# Patient Record
Sex: Male | Born: 1950 | Race: Asian | Hispanic: No | Smoking: Former smoker
Health system: Southern US, Community
[De-identification: ages and names within clinical notes are randomized; demographics above are authoritative.]

## PROBLEM LIST (undated history)

## (undated) ENCOUNTER — Emergency Department: Admission: EM | Discharge status: Absent without leave | Payer: Self-pay

## (undated) DIAGNOSIS — N400 Enlarged prostate without lower urinary tract symptoms: Secondary | ICD-10-CM

## (undated) DIAGNOSIS — J9819 Other pulmonary collapse: Secondary | ICD-10-CM

## (undated) DIAGNOSIS — J439 Emphysema, unspecified: Secondary | ICD-10-CM

## (undated) DIAGNOSIS — H269 Unspecified cataract: Secondary | ICD-10-CM

## (undated) DIAGNOSIS — I10 Essential (primary) hypertension: Secondary | ICD-10-CM

## (undated) DIAGNOSIS — N210 Calculus in bladder: Secondary | ICD-10-CM

## (undated) HISTORY — PX: PROSTATE SURGERY: SHX751

## (undated) HISTORY — DX: Unspecified cataract: H26.9

## (undated) HISTORY — PX: OTHER SURGICAL HISTORY: SHX169

## (undated) HISTORY — PX: NO PAST SURGERIES: SHX2092

---

## 2014-12-25 DIAGNOSIS — R079 Chest pain, unspecified: Secondary | ICD-10-CM | POA: Diagnosis not present

## 2015-03-17 DIAGNOSIS — J441 Chronic obstructive pulmonary disease with (acute) exacerbation: Secondary | ICD-10-CM | POA: Diagnosis not present

## 2015-05-29 ENCOUNTER — Other Ambulatory Visit: Payer: Self-pay | Admitting: Internal Medicine

## 2015-05-29 ENCOUNTER — Ambulatory Visit
Admission: RE | Admit: 2015-05-29 | Discharge: 2015-05-29 | Disposition: A | Discharge status: Home / Self Care | Payer: Medicare Other | Source: Ambulatory Visit | Attending: Internal Medicine | Admitting: Internal Medicine

## 2015-05-29 DIAGNOSIS — R911 Solitary pulmonary nodule: Secondary | ICD-10-CM | POA: Diagnosis not present

## 2015-05-29 DIAGNOSIS — R05 Cough: Secondary | ICD-10-CM | POA: Insufficient documentation

## 2015-05-29 DIAGNOSIS — J90 Pleural effusion, not elsewhere classified: Secondary | ICD-10-CM

## 2015-05-29 DIAGNOSIS — R0602 Shortness of breath: Secondary | ICD-10-CM | POA: Diagnosis not present

## 2015-05-29 DIAGNOSIS — R059 Cough, unspecified: Secondary | ICD-10-CM

## 2015-06-04 ENCOUNTER — Ambulatory Visit
Admission: RE | Admit: 2015-06-04 | Discharge: 2015-06-04 | Disposition: A | Discharge status: Home / Self Care | Payer: Medicare Other | Source: Ambulatory Visit | Attending: Internal Medicine | Admitting: Internal Medicine

## 2015-06-04 DIAGNOSIS — J9811 Atelectasis: Secondary | ICD-10-CM | POA: Diagnosis not present

## 2015-06-04 DIAGNOSIS — N200 Calculus of kidney: Secondary | ICD-10-CM | POA: Diagnosis not present

## 2015-06-04 DIAGNOSIS — I714 Abdominal aortic aneurysm, without rupture: Secondary | ICD-10-CM | POA: Insufficient documentation

## 2015-06-04 DIAGNOSIS — R918 Other nonspecific abnormal finding of lung field: Secondary | ICD-10-CM | POA: Diagnosis not present

## 2015-06-04 DIAGNOSIS — J439 Emphysema, unspecified: Secondary | ICD-10-CM | POA: Insufficient documentation

## 2015-06-04 DIAGNOSIS — J90 Pleural effusion, not elsewhere classified: Secondary | ICD-10-CM | POA: Diagnosis not present

## 2015-06-04 DIAGNOSIS — I700 Atherosclerosis of aorta: Secondary | ICD-10-CM | POA: Insufficient documentation

## 2015-06-04 DIAGNOSIS — J984 Other disorders of lung: Secondary | ICD-10-CM | POA: Diagnosis not present

## 2015-06-04 HISTORY — DX: Essential (primary) hypertension: I10

## 2015-06-04 MED ORDER — IOHEXOL 300 MG/ML  SOLN
75.0000 mL | Freq: Once | INTRAMUSCULAR | Status: AC | PRN
  Administered 2015-06-04: 75 mL via INTRAVENOUS

## 2015-06-06 ENCOUNTER — Other Ambulatory Visit
Admission: RE | Admit: 2015-06-06 | Discharge: 2015-06-06 | Disposition: A | Discharge status: Home / Self Care | Payer: Medicare Other | Source: Ambulatory Visit | Attending: Physician Assistant | Admitting: Physician Assistant

## 2015-06-06 DIAGNOSIS — R918 Other nonspecific abnormal finding of lung field: Secondary | ICD-10-CM | POA: Insufficient documentation

## 2015-06-06 DIAGNOSIS — R0602 Shortness of breath: Secondary | ICD-10-CM | POA: Diagnosis not present

## 2015-06-06 DIAGNOSIS — J439 Emphysema, unspecified: Secondary | ICD-10-CM | POA: Insufficient documentation

## 2015-06-09 ENCOUNTER — Other Ambulatory Visit
Admission: RE | Admit: 2015-06-09 | Discharge: 2015-06-09 | Disposition: A | Discharge status: Home / Self Care | Payer: Medicare Other | Source: Other Acute Inpatient Hospital | Attending: Physician Assistant | Admitting: Physician Assistant

## 2015-06-09 DIAGNOSIS — J90 Pleural effusion, not elsewhere classified: Secondary | ICD-10-CM | POA: Insufficient documentation

## 2015-06-09 DIAGNOSIS — R918 Other nonspecific abnormal finding of lung field: Secondary | ICD-10-CM | POA: Insufficient documentation

## 2015-06-09 DIAGNOSIS — J439 Emphysema, unspecified: Secondary | ICD-10-CM | POA: Diagnosis not present

## 2015-06-09 LAB — EXPECTORATED SPUTUM ASSESSMENT W REFEX TO RESP CULTURE

## 2015-06-09 LAB — EXPECTORATED SPUTUM ASSESSMENT W GRAM STAIN, RFLX TO RESP C

## 2015-06-12 DIAGNOSIS — F17211 Nicotine dependence, cigarettes, in remission: Secondary | ICD-10-CM | POA: Diagnosis not present

## 2015-06-12 DIAGNOSIS — J90 Pleural effusion, not elsewhere classified: Secondary | ICD-10-CM | POA: Diagnosis not present

## 2015-06-13 ENCOUNTER — Other Ambulatory Visit: Payer: Self-pay | Admitting: Internal Medicine

## 2015-06-13 DIAGNOSIS — J90 Pleural effusion, not elsewhere classified: Secondary | ICD-10-CM

## 2015-06-16 ENCOUNTER — Other Ambulatory Visit: Payer: Self-pay | Admitting: Radiology

## 2015-06-17 ENCOUNTER — Ambulatory Visit
Admission: RE | Admit: 2015-06-17 | Discharge: 2015-06-17 | Disposition: A | Discharge status: Home / Self Care | Payer: Medicare Other | Source: Ambulatory Visit | Attending: Internal Medicine | Admitting: Internal Medicine

## 2015-06-17 ENCOUNTER — Other Ambulatory Visit: Payer: Self-pay | Admitting: Internal Medicine

## 2015-06-17 DIAGNOSIS — J90 Pleural effusion, not elsewhere classified: Secondary | ICD-10-CM

## 2015-06-17 NOTE — Progress Notes (Signed)
Patient understands English fairly well but [place] Interpreter present.

## 2015-06-18 DIAGNOSIS — R0602 Shortness of breath: Secondary | ICD-10-CM | POA: Diagnosis not present

## 2015-07-22 LAB — ACID FAST SMEAR+CULTURE W/RFLX (ARMC ONLY)
Acid Fast Culture: NEGATIVE
Acid Fast Smear: NEGATIVE

## 2015-07-23 LAB — ACID FAST SMEAR+CULTURE W/RFLX (ARMC ONLY)
ACID FAST SMEAR: NEGATIVE
Acid Fast Culture: NEGATIVE

## 2015-07-28 LAB — ACID FAST SMEAR+CULTURE W/RFLX (ARMC ONLY)
Acid Fast Culture: NEGATIVE
Acid Fast Smear: NEGATIVE

## 2015-08-12 ENCOUNTER — Other Ambulatory Visit: Payer: Self-pay | Admitting: Internal Medicine

## 2015-08-12 DIAGNOSIS — J90 Pleural effusion, not elsewhere classified: Secondary | ICD-10-CM

## 2015-08-12 DIAGNOSIS — F17211 Nicotine dependence, cigarettes, in remission: Secondary | ICD-10-CM | POA: Diagnosis not present

## 2015-08-12 DIAGNOSIS — J439 Emphysema, unspecified: Secondary | ICD-10-CM | POA: Diagnosis not present

## 2015-08-14 ENCOUNTER — Ambulatory Visit
Admission: RE | Admit: 2015-08-14 | Discharge: 2015-08-14 | Disposition: A | Discharge status: Home / Self Care | Payer: Medicare Other | Source: Ambulatory Visit | Attending: Internal Medicine | Admitting: Internal Medicine

## 2015-08-14 DIAGNOSIS — J439 Emphysema, unspecified: Secondary | ICD-10-CM | POA: Insufficient documentation

## 2015-08-14 DIAGNOSIS — R918 Other nonspecific abnormal finding of lung field: Secondary | ICD-10-CM | POA: Insufficient documentation

## 2015-08-14 DIAGNOSIS — J90 Pleural effusion, not elsewhere classified: Secondary | ICD-10-CM | POA: Insufficient documentation

## 2015-08-14 DIAGNOSIS — I714 Abdominal aortic aneurysm, without rupture: Secondary | ICD-10-CM | POA: Diagnosis not present

## 2015-08-14 DIAGNOSIS — Z79899 Other long term (current) drug therapy: Secondary | ICD-10-CM | POA: Diagnosis not present

## 2015-08-14 LAB — GLUCOSE, CAPILLARY: Glucose-Capillary: 94 mg/dL (ref 65–99)

## 2015-08-14 MED ORDER — FLUDEOXYGLUCOSE F - 18 (FDG) INJECTION
12.0000 | Freq: Once | INTRAVENOUS | Status: DC | PRN
  Administered 2015-08-14: 12.9 via INTRAVENOUS
  Filled 2015-08-14: qty 12

## 2015-08-19 DIAGNOSIS — R918 Other nonspecific abnormal finding of lung field: Secondary | ICD-10-CM | POA: Diagnosis not present

## 2015-08-19 DIAGNOSIS — J439 Emphysema, unspecified: Secondary | ICD-10-CM | POA: Diagnosis not present

## 2015-08-19 DIAGNOSIS — J90 Pleural effusion, not elsewhere classified: Secondary | ICD-10-CM | POA: Diagnosis not present

## 2015-09-17 ENCOUNTER — Other Ambulatory Visit: Payer: Self-pay | Admitting: Physician Assistant

## 2015-09-17 DIAGNOSIS — J439 Emphysema, unspecified: Secondary | ICD-10-CM

## 2015-09-17 DIAGNOSIS — R918 Other nonspecific abnormal finding of lung field: Secondary | ICD-10-CM

## 2015-09-30 ENCOUNTER — Ambulatory Visit
Admission: RE | Admit: 2015-09-30 | Discharge: 2015-09-30 | Disposition: A | Discharge status: Home / Self Care | Payer: Medicare Other | Source: Ambulatory Visit | Attending: Physician Assistant | Admitting: Physician Assistant

## 2015-09-30 DIAGNOSIS — R918 Other nonspecific abnormal finding of lung field: Secondary | ICD-10-CM | POA: Diagnosis present

## 2015-09-30 DIAGNOSIS — J439 Emphysema, unspecified: Secondary | ICD-10-CM

## 2015-09-30 DIAGNOSIS — R05 Cough: Secondary | ICD-10-CM | POA: Diagnosis not present

## 2015-09-30 DIAGNOSIS — J189 Pneumonia, unspecified organism: Secondary | ICD-10-CM | POA: Diagnosis not present

## 2015-09-30 MED ORDER — IOHEXOL 350 MG/ML SOLN
75.0000 mL | Freq: Once | INTRAVENOUS | Status: AC | PRN
  Administered 2015-09-30: 75 mL via INTRAVENOUS

## 2015-10-07 DIAGNOSIS — J439 Emphysema, unspecified: Secondary | ICD-10-CM | POA: Diagnosis not present

## 2015-10-07 DIAGNOSIS — R0602 Shortness of breath: Secondary | ICD-10-CM | POA: Diagnosis not present

## 2015-10-07 DIAGNOSIS — R918 Other nonspecific abnormal finding of lung field: Secondary | ICD-10-CM | POA: Diagnosis not present

## 2015-11-11 DIAGNOSIS — J439 Emphysema, unspecified: Secondary | ICD-10-CM | POA: Diagnosis not present

## 2015-11-11 DIAGNOSIS — J189 Pneumonia, unspecified organism: Secondary | ICD-10-CM | POA: Diagnosis not present

## 2015-12-30 ENCOUNTER — Other Ambulatory Visit: Payer: Self-pay | Admitting: Internal Medicine

## 2015-12-30 DIAGNOSIS — J439 Emphysema, unspecified: Secondary | ICD-10-CM | POA: Diagnosis not present

## 2015-12-30 DIAGNOSIS — R0602 Shortness of breath: Secondary | ICD-10-CM | POA: Diagnosis not present

## 2015-12-30 DIAGNOSIS — F17211 Nicotine dependence, cigarettes, in remission: Secondary | ICD-10-CM | POA: Diagnosis not present

## 2015-12-30 DIAGNOSIS — J189 Pneumonia, unspecified organism: Secondary | ICD-10-CM

## 2016-01-22 ENCOUNTER — Ambulatory Visit
Admission: RE | Admit: 2016-01-22 | Discharge: 2016-01-22 | Disposition: A | Discharge status: Home / Self Care | Payer: Medicare Other | Source: Ambulatory Visit | Attending: Internal Medicine | Admitting: Internal Medicine

## 2016-01-22 DIAGNOSIS — J439 Emphysema, unspecified: Secondary | ICD-10-CM | POA: Insufficient documentation

## 2016-01-22 DIAGNOSIS — J189 Pneumonia, unspecified organism: Secondary | ICD-10-CM | POA: Insufficient documentation

## 2016-01-22 DIAGNOSIS — I2510 Atherosclerotic heart disease of native coronary artery without angina pectoris: Secondary | ICD-10-CM | POA: Insufficient documentation

## 2016-01-22 DIAGNOSIS — I714 Abdominal aortic aneurysm, without rupture: Secondary | ICD-10-CM | POA: Insufficient documentation

## 2016-01-22 LAB — POCT I-STAT CREATININE: Creatinine, Ser: 0.8 mg/dL (ref 0.61–1.24)

## 2016-01-22 MED ORDER — IOHEXOL 300 MG/ML  SOLN
75.0000 mL | Freq: Once | INTRAMUSCULAR | Status: AC | PRN
  Administered 2016-01-22: 75 mL via INTRAVENOUS

## 2016-02-10 DIAGNOSIS — J439 Emphysema, unspecified: Secondary | ICD-10-CM | POA: Diagnosis not present

## 2016-02-10 DIAGNOSIS — R918 Other nonspecific abnormal finding of lung field: Secondary | ICD-10-CM | POA: Diagnosis not present

## 2016-02-10 DIAGNOSIS — I714 Abdominal aortic aneurysm, without rupture: Secondary | ICD-10-CM | POA: Diagnosis not present

## 2016-03-01 DIAGNOSIS — I714 Abdominal aortic aneurysm, without rupture: Secondary | ICD-10-CM | POA: Diagnosis not present

## 2017-10-31 DIAGNOSIS — R300 Dysuria: Secondary | ICD-10-CM | POA: Diagnosis not present

## 2017-10-31 DIAGNOSIS — N3001 Acute cystitis with hematuria: Secondary | ICD-10-CM | POA: Diagnosis not present

## 2017-11-29 HISTORY — PX: PROSTATE SURGERY: SHX751

## 2017-12-19 ENCOUNTER — Emergency Department: Payer: Medicare Other

## 2017-12-19 ENCOUNTER — Encounter: Payer: Self-pay | Admitting: *Deleted

## 2017-12-19 ENCOUNTER — Emergency Department
Admission: EM | Admit: 2017-12-19 | Discharge: 2017-12-19 | Disposition: A | Discharge status: Home / Self Care | Payer: Medicare Other | Attending: Emergency Medicine | Admitting: Emergency Medicine

## 2017-12-19 ENCOUNTER — Other Ambulatory Visit: Payer: Self-pay

## 2017-12-19 DIAGNOSIS — I714 Abdominal aortic aneurysm, without rupture, unspecified: Secondary | ICD-10-CM

## 2017-12-19 DIAGNOSIS — F17200 Nicotine dependence, unspecified, uncomplicated: Secondary | ICD-10-CM | POA: Diagnosis not present

## 2017-12-19 DIAGNOSIS — N200 Calculus of kidney: Secondary | ICD-10-CM | POA: Insufficient documentation

## 2017-12-19 DIAGNOSIS — I10 Essential (primary) hypertension: Secondary | ICD-10-CM | POA: Insufficient documentation

## 2017-12-19 DIAGNOSIS — R319 Hematuria, unspecified: Secondary | ICD-10-CM | POA: Diagnosis present

## 2017-12-19 LAB — COMPREHENSIVE METABOLIC PANEL
ALBUMIN: 4.2 g/dL (ref 3.5–5.0)
ALT: 15 U/L — ABNORMAL LOW (ref 17–63)
ANION GAP: 9 (ref 5–15)
AST: 25 U/L (ref 15–41)
Alkaline Phosphatase: 60 U/L (ref 38–126)
BUN: 17 mg/dL (ref 6–20)
CHLORIDE: 105 mmol/L (ref 101–111)
CO2: 27 mmol/L (ref 22–32)
Calcium: 9.2 mg/dL (ref 8.9–10.3)
Creatinine, Ser: 0.9 mg/dL (ref 0.61–1.24)
GFR calc non Af Amer: >60 mL/min (ref >60)
GLUCOSE: 142 mg/dL — AB (ref 65–99)
POTASSIUM: 4.3 mmol/L (ref 3.5–5.1)
SODIUM: 141 mmol/L (ref 135–145)
Total Bilirubin: 0.5 mg/dL (ref 0.3–1.2)
Total Protein: 7.5 g/dL (ref 6.5–8.1)

## 2017-12-19 LAB — URINALYSIS, COMPLETE (UACMP) WITH MICROSCOPIC
Bacteria, UA: NONE SEEN
Bilirubin Urine: NEGATIVE
GLUCOSE, UA: NEGATIVE mg/dL
Ketones, ur: NEGATIVE mg/dL
Nitrite: NEGATIVE
PROTEIN: 100 mg/dL — AB
RBC / HPF: NONE SEEN RBC/hpf (ref 0–5)
SQUAMOUS EPITHELIAL / LPF: NONE SEEN
Specific Gravity, Urine: 1.02 (ref 1.005–1.030)
WBC, UA: NONE SEEN WBC/hpf (ref 0–5)
pH: 6 (ref 5.0–8.0)

## 2017-12-19 LAB — CBC
HEMATOCRIT: 39.7 % — AB (ref 40.0–52.0)
Hemoglobin: 12.9 g/dL — ABNORMAL LOW (ref 13.0–18.0)
MCH: 28.3 pg (ref 26.0–34.0)
MCHC: 32.3 g/dL (ref 32.0–36.0)
MCV: 87.5 fL (ref 80.0–100.0)
Platelets: 329 10*3/uL (ref 150–440)
RBC: 4.54 MIL/uL (ref 4.40–5.90)
RDW: 13.7 % (ref 11.5–14.5)
WBC: 6.7 10*3/uL (ref 3.8–10.6)

## 2017-12-19 NOTE — Progress Notes (Signed)
Pt wanted to walk; refusing wheelchair to ct.

## 2017-12-19 NOTE — ED Notes (Signed)
Patient transported to CT 

## 2017-12-19 NOTE — ED Notes (Signed)
Pt ambulatory upon discharge. Verbalized understanding of discharge instructions and follow-up care. VSS. Skin warm and dry. A&O x4.  

## 2017-12-19 NOTE — ED Provider Notes (Signed)
[facility] Emergency Department Provider Note  ____________________________________________  Time seen: Approximately 8:40 PM  I have reviewed the triage vital signs and the nursing notes.   HISTORY  Chief Complaint Hematuria   HPI Jesse Douglas is a 67 y.o. male who presents for evaluation of hematuria. Patient reports that he noticed hematuria month ago. He went to urgent care and was given a medication that he does not remember the name. He took it for 2 weeks with resolution of his symptoms. 2 weeks ago when the medication was over his started having again hematuria that he describes as blood-tinged urine. He denies abdominal pain or flank pain. He denies any prior history of kidney stones. He denies any personal or family history of cancer. He denies any intentional weight loss or night sweats. He does endorse for the last 2 days burning sensation when he urinates. No fever or chills, no nausea or vomiting.  Past Medical History:  Diagnosis Date  . Hypertension     There are no active problems to display for this patient.   No past surgical history on file.  Prior to Admission medications   Medication Sig Start Date End Date Taking? Authorizing Provider  Multiple Vitamin (MULTIVITAMIN) capsule Take 1 capsule by mouth daily.    [provider]    Allergies Penicillins  No family history on file.  Social History Social History   Tobacco Use  . Smoking status: Current Every Day Smoker    Last attempt to quit: 04/17/2015    Years since quitting: 2.6  . Smokeless tobacco: Never Used  Substance Use Topics  . Alcohol use: No  . Drug use: No    Review of Systems  Constitutional: Negative for fever. Eyes: Negative for visual changes. ENT: Negative for sore throat. Neck: No neck pain  Cardiovascular: Negative for chest pain. Respiratory: Negative for shortness of breath. Gastrointestinal: Negative for abdominal pain, vomiting or  diarrhea. Genitourinary: Negative for dysuria. + hematuria Musculoskeletal: Negative for back pain. Skin: Negative for rash. Neurological: Negative for headaches, weakness or numbness. Psych: No SI or HI  ____________________________________________   PHYSICAL EXAM:  VITAL SIGNS: ED Triage Vitals  Enc Vitals Group     BP 12/19/17 1703 (!) 155/88     Pulse Rate 12/19/17 1703 (!) 116     Resp 12/19/17 1703 18     Temp 12/19/17 1703 98.7 F (37.1 C)     Temp Source 12/19/17 1703 Oral     SpO2 12/19/17 1703 98 %     Weight 12/19/17 1705 85 lb (38.6 kg)     Height 12/19/17 1705 5\' 4"  (1.626 m)     Head Circumference --      Peak Flow --      Pain Score 12/19/17 1703 8     Pain Loc --      Pain Edu? --      Excl. in GC? --     Constitutional: Alert and oriented. Well appearing and in no apparent distress. HEENT:      Head: Normocephalic and atraumatic.         Eyes: Conjunctivae are normal. Sclera is non-icteric.       Mouth/Throat: Mucous membranes are moist.       Neck: Supple with no signs of meningismus. Cardiovascular: Regular rate and rhythm. No murmurs, gallops, or rubs. 2+ symmetrical distal pulses are present in all extremities. No JVD. Respiratory: Normal respiratory effort. Lungs are clear to auscultation bilaterally.  No wheezes, crackles, or rhonchi.  Gastrointestinal: Soft, non tender, and non distended with positive bowel sounds. No rebound or guarding. Genitourinary: No CVA tenderness. Musculoskeletal: Nontender with normal range of motion in all extremities. No edema, cyanosis, or erythema of extremities. Neurologic: Normal speech and language. Face is symmetric. Moving all extremities. No gross focal neurologic deficits are appreciated. Skin: Skin is warm, dry and intact. No rash noted. Psychiatric: Mood and affect are normal. Speech and behavior are normal.  ____________________________________________   LABS (all labs ordered are listed, but only  abnormal results are displayed)  Labs Reviewed  COMPREHENSIVE METABOLIC PANEL - Abnormal; Notable for the following components:      Result Value   Glucose, Bld 142 (*)    ALT 15 (*)    All other components within normal limits  CBC - Abnormal; Notable for the following components:   Hemoglobin 12.9 (*)    HCT 39.7 (*)    All other components within normal limits  URINALYSIS, COMPLETE (UACMP) WITH MICROSCOPIC - Abnormal; Notable for the following components:   Color, Urine AMBER (*)    APPearance CLOUDY (*)    Hgb urine dipstick LARGE (*)    Protein, ur 100 (*)    Leukocytes, UA TRACE (*)    All other components within normal limits  URINE CULTURE   ____________________________________________  EKG  none  ____________________________________________  RADIOLOGY  CT renal:  1. 14 x 11 mm calculus within the bladder lumen. Surrounding bladder wall thickening may be related incomplete distension, irritation/inflammation, or possibly acute infection. Correlation with urinalysis recommended. 2. Additional nonobstructive right renal nephrolithiasis measuring up to 7 mm. No obstructive uropathy. 3. Severe emphysema. Intra-abdominal aortic aneurysm measuring up to 3.4 cm in diameter. Recommend followup by ultrasound in 3 years. This recommendation follows ACR consensus guidelines: White Paper of the ACR Incidental Findings Committee II on Vascular Findings. J Am Coll Radiol 2013; 16:109-604 ____________________________________________   PROCEDURES  Procedure(s) performed: None Procedures Critical Care performed:  None ____________________________________________   INITIAL IMPRESSION / ASSESSMENT AND PLAN / ED COURSE  67 y.o. male who presents for evaluation of hematuria x 2 weeks and dysuria. Patient is well-appearing, in no distress, abdomen is soft with no tenderness or masses, no flank tenderness. Urinalysis shows large hemoglobin but no evidence of infection. White  count and creatinine are within normal limits. CT scan concerning for a 14 x 11 mm calculus within the bladder lumen. CT also concerning for an accidental finding of the intra-abdominal aortic aneurysm measuring 3.4 cm in diameter. Hematuria from a recently passed kidney stone. No evidence of AKI or overlying UTI. No evidence of acute blood loss anemia.Will refer to Urology and vascular for outpatient follow up.       As part of my medical decision making, I reviewed the following data within the electronic MEDICAL RECORD NUMBER Nursing notes reviewed and incorporated, Labs reviewed , Radiograph reviewed , Notes from prior ED visits and [place] Controlled Substance Database    Pertinent labs & imaging results that were available during my care of the patient were reviewed by me and considered in my medical decision making (see chart for details).    ____________________________________________   FINAL CLINICAL IMPRESSION(S) / ED DIAGNOSES  Final diagnoses:  Kidney stone  Abdominal aortic aneurysm (AAA) without rupture (HCC)      NEW MEDICATIONS STARTED DURING THIS VISIT:  ED Discharge Orders    None       Note:  This document was prepared  using Conservation officer, historic buildingsDragon voice recognition software and may include unintentional dictation errors.    Nita SickleVeronese, [place], MD 12/19/17 [id number]

## 2017-12-19 NOTE — Discharge Instructions (Signed)
Your CT scan showed you had a kidney stone that passed and is now in your bladder. This can cause blood in the urine and pain when you pee. This is not a serious problem and the pain will resolve in a few days. Follow up with Dr. Apolinar JunesBrandon, Urology.  Your CT also showed you have a bulge in your aorta. This needs to be followed very closely by a vascular doctor and I am referring you to Dr. Gilda CreaseSchnier. If you develop abdominal pain, chest pain, or back pain you need to return to the ER urgent as these bulges can get worse or even rupture and therefore can cause severe problems including death. Make sure to schedule an appointment with Dr. Gilda CreaseSchnier within the next week.

## 2017-12-19 NOTE — ED Notes (Signed)
Pt reports pain while urinating x30 days. Reports blood in urine. Was on medicine for urinary tract infection but did not help. Cannot remember name of medication.

## 2017-12-19 NOTE — ED Triage Notes (Addendum)
Pt reports hematuria since yesterday.  No n/v/d.  Pr has urinary urgency.  Pt denies back pain.  Pt alert.  Pt seen at next care and sent to er for eval of blood in urine.

## 2017-12-21 LAB — URINE CULTURE: Culture: NO GROWTH

## 2017-12-30 ENCOUNTER — Ambulatory Visit (INDEPENDENT_AMBULATORY_CARE_PROVIDER_SITE_OTHER): Payer: Medicare Other | Admitting: Urology

## 2017-12-30 ENCOUNTER — Encounter: Payer: Self-pay | Admitting: Urology

## 2017-12-30 VITALS — BP 159/97 | HR 99 | Ht 63.0 in | Wt 81.0 lb

## 2017-12-30 DIAGNOSIS — N401 Enlarged prostate with lower urinary tract symptoms: Secondary | ICD-10-CM | POA: Diagnosis not present

## 2017-12-30 DIAGNOSIS — N210 Calculus in bladder: Secondary | ICD-10-CM | POA: Diagnosis not present

## 2017-12-30 DIAGNOSIS — R310 Gross hematuria: Secondary | ICD-10-CM | POA: Diagnosis not present

## 2017-12-30 DIAGNOSIS — N200 Calculus of kidney: Secondary | ICD-10-CM | POA: Diagnosis not present

## 2017-12-30 DIAGNOSIS — R3915 Urgency of urination: Secondary | ICD-10-CM | POA: Diagnosis not present

## 2017-12-30 HISTORY — DX: Calculus in bladder: N21.0

## 2017-12-30 LAB — URINALYSIS, COMPLETE
Bilirubin, UA: NEGATIVE
Glucose, UA: NEGATIVE
Ketones, UA: NEGATIVE
Nitrite, UA: NEGATIVE
PH UA: 7.5 (ref 5.0–7.5)
PROTEIN UA: NEGATIVE
Specific Gravity, UA: 1.015 (ref 1.005–1.030)
Urobilinogen, Ur: 0.2 mg/dL (ref 0.2–1.0)

## 2017-12-30 LAB — BLADDER SCAN AMB NON-IMAGING

## 2017-12-30 LAB — MICROSCOPIC EXAMINATION
BACTERIA UA: NONE SEEN
EPITHELIAL CELLS (NON RENAL): NONE SEEN /HPF (ref 0–10)
WBC, UA: NONE SEEN /hpf (ref 0–?)

## 2017-12-30 MED ORDER — TAMSULOSIN HCL 0.4 MG PO CAPS: 0.4000 mg | ORAL_CAPSULE | Freq: Every day | ORAL | 11 refills | Status: DC

## 2017-12-30 NOTE — Progress Notes (Signed)
12/30/2017 2:55 PM   Jesse Douglas ZOX June 15, 1951 [id number]  Referring provider: Yevonne Pax, MD [address] [place], [place] [address]  Chief Complaint  Patient presents with  . Bladder Stone    HPI: 67 year old [place] speaking male who presents today for further evaluation of severe urinary symptoms, bladder stone, kidney stone, and gross hematuria.  He was initially seen and evaluated in the emergency room on 12/19/2017 with gross hematuria.  He reports that prior to this, he had several week history of increased urinary symptoms including urgency, frequency, nocturia, difficulty with his urinary stream and emptying his bladder.  He then developed episodes of painless gross hematuria.    Prior to this episode, he reports very few urinary symptoms.  He was getting up 1-2 times at night to void but denies any urgency, frequency, weak stream, or any other urinary issues.  For the last month, he has had severe urinary symptoms as described above which persists today.  Noncontrast CT scan in the emergency room revealed a large 14 x 11 mm bladder stone with circumferential bladder wall thickening as well as a 7 mm right upper pole stone and punctate right lower pole stone.   UA in the emergency room showed blood and leukocytes on dip but not under microscopy.  Urine culture was negative.  He does have evidence of microscopic hematuria today.  He denies a personal history of bladder or kidney stones.  He denies any flank pain bilaterally.  No previous history of gross hematuria.  He does have an extensive smoking history, has smoked since age 39 and continues today.  He smoked his watches 1 pack a day but has now cut back to 5 cigarettes daily.  He does have significant history of COPD and has been screened for lung cancer in the past.  Although he denies any weight loss, he is cachectic appearing with a BMI of 14.  No family history of prostate cancer.  No previous PSAs for  evaluation.   IPSS    Row Name 12/30/17 1200         International Prostate Symptom Score   How often have you had the sensation of not emptying your bladder?  About half the time     How often have you had to urinate less than every two hours?  More than half the time     How often have you found you stopped and started again several times when you urinated?  About half the time     How often have you found it difficult to postpone urination?  Less than half the time     How often have you had a weak urinary stream?  Almost always     How often have you had to strain to start urination?  Not at All     How many times did you typically get up at night to urinate?  2 Times     Total IPSS Score  19       Quality of Life due to urinary symptoms   If you were to spend the rest of your life with your urinary condition just the way it is now how would you feel about that?  Unhappy        Score:  1-7 Mild 8-19 Moderate 20-35 Severe    PMH: Past Medical History:  Diagnosis Date  . Hypertension     Surgical History: Past Surgical History:  Procedure Laterality Date  . none  Home Medications:  Allergies as of 12/30/2017      Reactions   Penicillins       Medication List        Accurate as of 12/30/17 11:59 PM. Always use your most recent med list.          tamsulosin 0.4 MG Caps capsule Commonly known as:  FLOMAX Take 1 capsule (0.4 mg total) by mouth daily.       Allergies:  Allergies  Allergen Reactions  . Penicillins     Family History: No family history on file.  Social History:  reports that he has been smoking.  he has never used smokeless tobacco. He reports that he does not drink alcohol or use drugs.  ROS: UROLOGY Frequent Urination?: Yes Hard to postpone urination?: Yes Burning/pain with urination?: Yes Get up at night to urinate?: Yes Leakage of urine?: Yes Urine stream starts and stops?: Yes Trouble starting stream?: Yes Do you have  to strain to urinate?: Yes Blood in urine?: Yes Urinary tract infection?: No Sexually transmitted disease?: No Injury to kidneys or bladder?: No Painful intercourse?: No Weak stream?: Yes Erection problems?: No Penile pain?: Yes  Gastrointestinal Nausea?: No Vomiting?: No Indigestion/heartburn?: No Diarrhea?: No Constipation?: No  Constitutional Fever: No Night sweats?: No Weight loss?: Yes Fatigue?: No  Skin Skin rash/lesions?: No Itching?: No  Eyes Blurred vision?: No Double vision?: No  Ears/Nose/Throat Sore throat?: No Sinus problems?: No  Hematologic/Lymphatic Swollen glands?: No Easy bruising?: No  Cardiovascular Leg swelling?: Yes Chest pain?: No  Respiratory Cough?: Yes Shortness of breath?: No  Endocrine Excessive thirst?: No  Musculoskeletal Back pain?: Yes Joint pain?: No  Neurological Headaches?: No Dizziness?: No  Psychologic Depression?: No  Physical Exam: BP (!) 159/97   Pulse 99   Ht 5\' 3"  (1.6 m)   Wt 81 lb (36.7 kg)   BMI 14.35 kg/m   Constitutional:  Alert and oriented, No acute distress.  Thin.  Accompanied by a [place] translator. HEENT: [place] AT, moist mucus membranes.  Trachea midline, no masses. Cardiovascular: No clubbing, cyanosis, or edema. Respiratory: Normal respiratory effort, no increased work of breathing. GI: Abdomen is soft, nontender, nondistended, no abdominal masses GU: Circumcised phallus with orthotopic meatus.  Bilateral descended testicles without masses. Rectal: Normal sphincter tone.  Enlarged 50+ cc prostate, bilateral nodularity which was rubbery most consistent with BPH nodules.  No firm nodules or induration. Skin: No rashes, bruises or suspicious lesions. Neurologic: Grossly intact, no focal deficits, moving all 4 extremities. Psychiatric: Normal mood and affect.  Laboratory Data: Lab Results  Component Value Date   WBC 6.7 12/19/2017   HGB 12.9 (L) 12/19/2017   HCT 39.7 (L) 12/19/2017    MCV 87.5 12/19/2017   PLT 329 12/19/2017    Lab Results  Component Value Date   CREATININE 0.90 12/19/2017    Lab Results  Component Value Date   PSA1 4.4 (H) 12/30/2017    Urinalysis Lab Results  Component Value Date   SPECGRAV 1.015 12/30/2017   PHUR 7.5 12/30/2017   COLORU Yellow 12/30/2017   APPEARANCEUR Clear 12/30/2017   LEUKOCYTESUR Trace (A) 12/30/2017   PROTEINUR Negative 12/30/2017   GLUCOSEU Negative 12/30/2017   KETONESU Negative 12/30/2017   RBCU 2+ (A) 12/30/2017   BILIRUBINUR Negative 12/30/2017   UUROB 0.2 12/30/2017   NITRITE Negative 12/30/2017    Lab Results  Component Value Date   LABMICR See below: 12/30/2017   WBCUA None seen 12/30/2017   RBCUA 11-30 (A)  12/30/2017   LABEPIT None seen 12/30/2017   BACTERIA None seen 12/30/2017    Pertinent Imaging: Results for orders placed during the hospital encounter of 12/19/17  CT Renal Stone Study   Narrative CLINICAL DATA:  Initial evaluation for acute dysuria, hematuria.  EXAM: CT ABDOMEN AND PELVIS WITHOUT CONTRAST  TECHNIQUE: Multidetector CT imaging of the abdomen and pelvis was performed following the standard protocol without IV contrast.  COMPARISON:  Prior CT from 08/14/2015.  FINDINGS: Lower chest: Severe emphysematous changes seen at the lung bases. Visualized lungs are otherwise clear.  Hepatobiliary: Limited noncontrast evaluation of the liver is unremarkable. Gallbladder within normal limits. No biliary dilatation.  Pancreas: Limited noncontrast evaluation of the pancreas grossly unremarkable.  Spleen: Spleen within normal limits.  Adrenals/Urinary Tract: Adrenal glands grossly unremarkable. Kidneys equal in size. 7 mm nonobstructive stone present at the upper pole the right kidney. Additional punctate 3 mm nonobstructive calculus present at the lower pole right kidney. No other radiopaque calculi identified within either kidney. No stones seen along the course  of either renal collecting system. No hydroureter.  Bladder largely decompressed. Large calculus measuring 14 x 11 mm present within the right bladder lumen (series 2, image 57). Circumferential bladder wall thickening likely related incomplete distension, although superimposed infection could be considered as well.  Stomach/Bowel: Stomach within normal limits. No evidence for bowel obstruction. No acute inflammatory changes seen about the bowels. Moderate retained stool diffusely throughout the colon.  Vascular/Lymphatic: Moderate aortic atherosclerosis. Intra-abdominal aneurysm measures up to 3.4 cm in maximal diameter. No appreciable adenopathy identified on this noncontrast examination.  Reproductive: Prostate mildly enlarged measuring 4.9 cm in transverse diameter.  Other: No free air or fluid.  Musculoskeletal: No acute osseus abnormality. No worrisome lytic or blastic osseous lesions.  IMPRESSION: 1. 14 x 11 mm calculus within the bladder lumen. Surrounding bladder wall thickening may be related incomplete distension, irritation/inflammation, or possibly acute infection. Correlation with urinalysis recommended. 2. Additional nonobstructive right renal nephrolithiasis measuring up to 7 mm. No obstructive uropathy. 3. Severe emphysema. Intra-abdominal aortic aneurysm measuring up to 3.4 cm in diameter. Recommend followup by ultrasound in 3 years. This recommendation follows ACR consensus guidelines: White Paper of the ACR Incidental Findings Committee II on Vascular Findings. [place] Coll Radiol 2013; 10:789-794   Electronically Signed   By: Rise Mu M.D.   On: 12/19/2017 20:23    PVR 0  Assessment & Plan:    1. Bladder stone 14 x 11 mm bladder stone likely contributing to irritative voiding symptoms Suspicious for underlying outlet obstruction as cause of bladder stone Previously asymptomatic with minimal PVR Recommend cystoscopy to rule out any  underlying pathology as well as to evaluate prostatic anatomy for possible concomitant outlet procedure - BLADDER SCAN AMB NON-IMAGING - CULTURE, URINE COMPREHENSIVE  2. Benign prostatic hyperplasia (BPH) with urinary urgency Enlarged prostate today on exam, likely contributing factor to bladder stone We will start patient on Flomax today for symptomatic urinary symptoms PSA today Estimated prostatic volume 40 cc CT scan  3. Gross hematuria CT scan reviewed, kidney stone and bladder stone likely underlying cause Recommend cystoscopy to rule out any underlying pathology especially given his extensive smoking history Consider bilateral retrograde to complete hematuria workup at the time of intervention for the stone - PSA  4. Right kidney stone Asymptomatic incidental 7 mm right upper pole stone as well as punctate right lower pole stone We will address prostate and bladder stone but may consider intervention for his  kidney stone down the road given its size - Urinalysis, Complete    Return for cysto with interperter.  Vanna Scotland, MD  [facility] [address], [address] [place], [place] [address] [phone]

## 2017-12-31 LAB — PSA: Prostate Specific Ag, Serum: 4.4 ng/mL — ABNORMAL HIGH (ref 0.0–4.0)

## 2018-01-02 ENCOUNTER — Other Ambulatory Visit: Payer: Self-pay | Admitting: Radiology

## 2018-01-02 ENCOUNTER — Ambulatory Visit (INDEPENDENT_AMBULATORY_CARE_PROVIDER_SITE_OTHER): Payer: Medicare Other | Admitting: Urology

## 2018-01-02 ENCOUNTER — Encounter: Payer: Self-pay | Admitting: Urology

## 2018-01-02 VITALS — BP 133/84 | HR 125 | Ht 63.0 in | Wt 85.0 lb

## 2018-01-02 DIAGNOSIS — N210 Calculus in bladder: Secondary | ICD-10-CM

## 2018-01-02 DIAGNOSIS — N138 Other obstructive and reflux uropathy: Secondary | ICD-10-CM

## 2018-01-02 DIAGNOSIS — R972 Elevated prostate specific antigen [PSA]: Secondary | ICD-10-CM

## 2018-01-02 DIAGNOSIS — N401 Enlarged prostate with lower urinary tract symptoms: Secondary | ICD-10-CM

## 2018-01-02 DIAGNOSIS — R310 Gross hematuria: Secondary | ICD-10-CM

## 2018-01-02 MED ORDER — LIDOCAINE HCL 2 % EX GEL
1.0000 "application " | Freq: Once | CUTANEOUS | Status: AC
  Administered 2018-01-02: 1 via URETHRAL

## 2018-01-02 MED ORDER — CIPROFLOXACIN HCL 500 MG PO TABS
500.0000 mg | ORAL_TABLET | Freq: Once | ORAL | Status: AC
  Administered 2018-01-02: 500 mg via ORAL

## 2018-01-02 NOTE — Progress Notes (Signed)
   01/02/18  CC:  Chief Complaint  Patient presents with  . Cysto    HPI: 67 year old male returns the office today for cystoscopy for further evaluation of gross hematuria.  Please see previous note for details.  Since last visit, he did have his PSA drawn which was mildly elevated to 4.4 in the setting of severe urinary symptoms and a large bladder stone.    Blood pressure 133/84, pulse (!) 125, height 5\' 3"  (1.6 m), weight 85 lb (38.6 kg). NED. A&Ox3.   No respiratory distress   Abd soft, NT, ND Normal phallus with bilateral descended testicles  Cystoscopy Procedure Note  Patient identification was confirmed, informed consent was obtained, and patient was prepped using Betadine solution.  Lidocaine jelly was administered per urethral meatus.    Preoperative abx where received prior to procedure.     Pre-Procedure: - Inspection reveals a normal caliber ureteral meatus.  Procedure: The flexible cystoscope was introduced without difficulty - No urethral strictures/lesions are present. - Enlarged prostate trilobar coaptation - Normal bladder neck - Bilateral ureteral orifices identified - Bladder mucosa  reveals no ulcers, tumors, or lesions other than some mild erythema around the bladder neck presumably from irritation from the bladder stone ball valving at the bladder neck - Mild trabeculation  Retroflexion shows bladder stone at the bladder neck, no significant median lobe component   Post-Procedure: - Patient tolerated the procedure well  Assessment/ Plan:  1. Bladder stone Symptomatic bladder stone likely causing intermittent obstruction/irritative voiding symptoms I recommended cystolitholapaxy to treat the stone, risk and benefits were discussed in detail - ciprofloxacin (CIPRO) tablet 500 mg - lidocaine (XYLOCAINE) 2 % jelly 1 application  2. Benign prostatic hyperplasia with urinary obstruction Admit a lengthy discussion today aided by vitamins  translator regarding causes of bladder stone. Most likely cause of bladder stone is secondary to underlying outlet obstruction presumably related to an enlarged prostate which was visible today on cystoscopy as well as CT scan He was offered a concomitant outlet procedure in the form of a TURP at the time of cystolitholapaxy primarily to the reduce the risk of recurrent bladder stone formation.  Risk of TURP were reviewed in detail including risk of bleeding, infection, damage to surrounding structures including damage to urethral sphincter, stress urinary incontinence, need for further procedures, need for overnight hospitalization with Foley catheter, retrograde ejaculation, amongst others.  All questions were answered.  He is interested in proceeding with this.  3. Elevated PSA PSA mildly elevated to 4.4 in the setting of large bladder stone No previous PSAs for comparison Prostate is enlarged and may be appropriate for density Rectal exam unremarkable other than prostamegaly Will defer a prostate biopsy at this point in time, recheck after his urinary symptoms have improved  4. Gross hematuria Likely secondary to #1 Recommend bilateral retrograde at the time of surgery to rule out any additional underlying upper tract pathology   Vanna ScotlandAshley Kamen Hanken, MD

## 2018-01-02 NOTE — H&P (View-Only) (Signed)
   01/02/18  CC:  Chief Complaint  Patient presents with  . Cysto    HPI: 67-year-old male returns the office today for cystoscopy for further evaluation of gross hematuria.  Please see previous note for details.  Since last visit, he did have his PSA drawn which was mildly elevated to 4.4 in the setting of severe urinary symptoms and a large bladder stone.    Blood pressure 133/84, pulse (!) 125, height 5' 3" (1.6 m), weight 85 lb (38.6 kg). NED. A&Ox3.   No respiratory distress   Abd soft, NT, ND Normal phallus with bilateral descended testicles  Cystoscopy Procedure Note  Patient identification was confirmed, informed consent was obtained, and patient was prepped using Betadine solution.  Lidocaine jelly was administered per urethral meatus.    Preoperative abx where received prior to procedure.     Pre-Procedure: - Inspection reveals a normal caliber ureteral meatus.  Procedure: The flexible cystoscope was introduced without difficulty - No urethral strictures/lesions are present. - Enlarged prostate trilobar coaptation - Normal bladder neck - Bilateral ureteral orifices identified - Bladder mucosa  reveals no ulcers, tumors, or lesions other than some mild erythema around the bladder neck presumably from irritation from the bladder stone ball valving at the bladder neck - Mild trabeculation  Retroflexion shows bladder stone at the bladder neck, no significant median lobe component   Post-Procedure: - Patient tolerated the procedure well  Assessment/ Plan:  1. Bladder stone Symptomatic bladder stone likely causing intermittent obstruction/irritative voiding symptoms I recommended cystolitholapaxy to treat the stone, risk and benefits were discussed in detail - ciprofloxacin (CIPRO) tablet 500 mg - lidocaine (XYLOCAINE) 2 % jelly 1 application  2. Benign prostatic hyperplasia with urinary obstruction Admit a lengthy discussion today aided by vitamins  translator regarding causes of bladder stone. Most likely cause of bladder stone is secondary to underlying outlet obstruction presumably related to an enlarged prostate which was visible today on cystoscopy as well as CT scan He was offered a concomitant outlet procedure in the form of a TURP at the time of cystolitholapaxy primarily to the reduce the risk of recurrent bladder stone formation.  Risk of TURP were reviewed in detail including risk of bleeding, infection, damage to surrounding structures including damage to urethral sphincter, stress urinary incontinence, need for further procedures, need for overnight hospitalization with Foley catheter, retrograde ejaculation, amongst others.  All questions were answered.  He is interested in proceeding with this.  3. Elevated PSA PSA mildly elevated to 4.4 in the setting of large bladder stone No previous PSAs for comparison Prostate is enlarged and may be appropriate for density Rectal exam unremarkable other than prostamegaly Will defer a prostate biopsy at this point in time, recheck after his urinary symptoms have improved  4. Gross hematuria Likely secondary to #1 Recommend bilateral retrograde at the time of surgery to rule out any additional underlying upper tract pathology   Gay Rape, MD  

## 2018-01-03 LAB — CULTURE, URINE COMPREHENSIVE

## 2018-01-06 ENCOUNTER — Other Ambulatory Visit: Payer: Self-pay

## 2018-01-06 ENCOUNTER — Encounter
Admission: RE | Admit: 2018-01-06 | Discharge: 2018-01-06 | Disposition: A | Discharge status: Home / Self Care | Payer: Medicare Other | Source: Ambulatory Visit | Attending: Urology | Admitting: Urology

## 2018-01-06 DIAGNOSIS — I10 Essential (primary) hypertension: Secondary | ICD-10-CM | POA: Diagnosis not present

## 2018-01-06 DIAGNOSIS — Z01818 Encounter for other preprocedural examination: Secondary | ICD-10-CM | POA: Insufficient documentation

## 2018-01-06 HISTORY — DX: Calculus in bladder: N21.0

## 2018-01-06 NOTE — Patient Instructions (Signed)
Your procedure is scheduled on: Tuesday, January 10, 2018 Report to Same Day Surgery on the 2nd floor in the [facility]. To find out your arrival time, please call [phone] between 1PM - 3PM on: Monday, January 09, 2018  REMEMBER: Instructions that are not followed completely may result in serious medical risk, up to and including death; or upon the discretion of your surgeon and anesthesiologist your surgery may need to be rescheduled.  Do not eat food after midnight the night before your procedure.  No gum chewing or hard candies.  You may however, drink CLEAR liquids up to 2 hours before you are scheduled to arrive at the hospital for your procedure.  Do not drink clear liquids within 2 hours of the start of your surgery.  Clear liquids include: - water  - apple juice without pulp - clear gatorade - black coffee or tea (Do NOT add anything to the coffee or tea) Do NOT drink anything that is not on this list.  No Alcohol for 24 hours before or after surgery.  No Smoking including e-cigarettes for 24 hours prior to surgery. No chewable tobacco products for at least 6 hours prior to surgery. No nicotine patches on the day of surgery.  On the morning of surgery brush your teeth with toothpaste and water, you may rinse your mouth with mouthwash if you wish. Do not swallow any  toothpaste of mouthwash.  Notify your doctor if there is any change in your medical condition (cold, fever, infection).  Do not wear jewelry, make-up, hairpins, clips or nail polish.  Do not wear lotions, powders, or perfumes. You may wear deodorant.  Do not shave 48 hours prior to surgery. Men may shave face and neck.  Contacts and dentures may not be worn into surgery.  Do not bring valuables to the hospital. [facility] is not responsible for any belongings or valuables.  TAKE THESE MEDICATIONS THE MORNING OF SURGERY WITH A SIP OF WATER:  NONE  NOW!  Stop Anti-inflammatories such as Advil,  Aleve, Ibuprofen, Motrin, Naproxen, Naprosyn, Goodie powder, or aspirin products. (May take Tylenol or Acetaminophen if needed.)  NOW!  Stop ANY OVER THE COUNTER supplements until after surgery.  If you are being admitted to the hospital overnight, leave your suitcase in the car. After surgery it may be brought to your room.  If you are being discharged the day of surgery, you will not be allowed to drive home. You will need someone to drive you home and stay with you that night.   If you are taking public transportation, you will need to have a responsible adult to with you.  Please call the number above if you have any questions about these instructions.

## 2018-01-09 MED ORDER — CIPROFLOXACIN IN D5W 400 MG/200ML IV SOLN
400.0000 mg | INTRAVENOUS | Status: AC
  Administered 2018-01-10: 400 mg via INTRAVENOUS

## 2018-01-10 ENCOUNTER — Other Ambulatory Visit: Payer: Self-pay

## 2018-01-10 ENCOUNTER — Ambulatory Visit: Payer: Medicare Other | Admitting: Anesthesiology

## 2018-01-10 ENCOUNTER — Inpatient Hospital Stay
Admission: RE | Admit: 2018-01-10 | Discharge: 2018-01-13 | DRG: 653 | Disposition: A | Discharge status: Home / Self Care | Payer: Medicare Other | Source: Ambulatory Visit | Attending: Urology | Admitting: Urology

## 2018-01-10 ENCOUNTER — Encounter
Admission: RE | Disposition: A | Discharge status: Home / Self Care | Payer: Self-pay | Source: Ambulatory Visit | Attending: Urology

## 2018-01-10 ENCOUNTER — Encounter: Payer: Self-pay | Admitting: *Deleted

## 2018-01-10 DIAGNOSIS — N200 Calculus of kidney: Secondary | ICD-10-CM | POA: Diagnosis not present

## 2018-01-10 DIAGNOSIS — Z5331 Laparoscopic surgical procedure converted to open procedure: Secondary | ICD-10-CM

## 2018-01-10 DIAGNOSIS — S3729XA Other injury of bladder, initial encounter: Secondary | ICD-10-CM

## 2018-01-10 DIAGNOSIS — N3289 Other specified disorders of bladder: Principal | ICD-10-CM | POA: Diagnosis present

## 2018-01-10 DIAGNOSIS — N401 Enlarged prostate with lower urinary tract symptoms: Secondary | ICD-10-CM

## 2018-01-10 DIAGNOSIS — J9621 Acute and chronic respiratory failure with hypoxia: Secondary | ICD-10-CM | POA: Diagnosis not present

## 2018-01-10 DIAGNOSIS — N138 Other obstructive and reflux uropathy: Secondary | ICD-10-CM | POA: Diagnosis present

## 2018-01-10 DIAGNOSIS — R531 Weakness: Secondary | ICD-10-CM | POA: Diagnosis present

## 2018-01-10 DIAGNOSIS — N139 Obstructive and reflux uropathy, unspecified: Secondary | ICD-10-CM

## 2018-01-10 DIAGNOSIS — N9971 Accidental puncture and laceration of a genitourinary system organ or structure during a genitourinary system procedure: Secondary | ICD-10-CM | POA: Diagnosis present

## 2018-01-10 DIAGNOSIS — F17210 Nicotine dependence, cigarettes, uncomplicated: Secondary | ICD-10-CM | POA: Diagnosis present

## 2018-01-10 DIAGNOSIS — N210 Calculus in bladder: Secondary | ICD-10-CM | POA: Diagnosis present

## 2018-01-10 DIAGNOSIS — R0902 Hypoxemia: Secondary | ICD-10-CM

## 2018-01-10 DIAGNOSIS — I10 Essential (primary) hypertension: Secondary | ICD-10-CM | POA: Diagnosis present

## 2018-01-10 DIAGNOSIS — R310 Gross hematuria: Secondary | ICD-10-CM

## 2018-01-10 DIAGNOSIS — J449 Chronic obstructive pulmonary disease, unspecified: Secondary | ICD-10-CM | POA: Diagnosis present

## 2018-01-10 HISTORY — PX: CYSTOSCOPY WITH LITHOLAPAXY: SHX1425

## 2018-01-10 HISTORY — PX: CYSTOGRAM: SHX6285

## 2018-01-10 HISTORY — PX: CYSTOSCOPY W/ RETROGRADES: SHX1426

## 2018-01-10 HISTORY — PX: BLADDER REPAIR: SHX6721

## 2018-01-10 SURGERY — CYSTOSCOPY, WITH BLADDER CALCULUS LITHOLAPAXY
Anesthesia: General | Site: Urethra | Wound class: Clean Contaminated

## 2018-01-10 MED ORDER — FAMOTIDINE 20 MG PO TABS
ORAL_TABLET | ORAL | Status: AC
  Administered 2018-01-10: 20 mg via ORAL
  Filled 2018-01-10: qty 1

## 2018-01-10 MED ORDER — OXYBUTYNIN CHLORIDE 5 MG PO TABS
5.0000 mg | ORAL_TABLET | Freq: Three times a day (TID) | ORAL | Status: DC | PRN
  Administered 2018-01-10: 5 mg via ORAL
  Filled 2018-01-10: qty 1

## 2018-01-10 MED ORDER — DIPHENHYDRAMINE HCL 12.5 MG/5ML PO ELIX
12.5000 mg | ORAL_SOLUTION | Freq: Four times a day (QID) | ORAL | Status: DC | PRN
  Filled 2018-01-10: qty 5

## 2018-01-10 MED ORDER — MORPHINE SULFATE (PF) 2 MG/ML IV SOLN: 2.0000 mg | INTRAVENOUS | Status: DC | PRN

## 2018-01-10 MED ORDER — FENTANYL CITRATE (PF) 100 MCG/2ML IJ SOLN
INTRAMUSCULAR | Status: AC
  Filled 2018-01-10: qty 2

## 2018-01-10 MED ORDER — FENTANYL CITRATE (PF) 100 MCG/2ML IJ SOLN
INTRAMUSCULAR | Status: AC
  Administered 2018-01-10: 25 ug via INTRAVENOUS
  Filled 2018-01-10: qty 2

## 2018-01-10 MED ORDER — CIPROFLOXACIN IN D5W 400 MG/200ML IV SOLN
INTRAVENOUS | Status: AC
  Filled 2018-01-10: qty 200

## 2018-01-10 MED ORDER — CEFAZOLIN SODIUM-DEXTROSE 2-4 GM/100ML-% IV SOLN
INTRAVENOUS | Status: AC
  Filled 2018-01-10: qty 100

## 2018-01-10 MED ORDER — PROPOFOL 10 MG/ML IV BOLUS
INTRAVENOUS | Status: AC
  Filled 2018-01-10: qty 20

## 2018-01-10 MED ORDER — SUCCINYLCHOLINE CHLORIDE 20 MG/ML IJ SOLN
INTRAMUSCULAR | Status: DC | PRN
  Administered 2018-01-10: 80 mg via INTRAVENOUS

## 2018-01-10 MED ORDER — FAMOTIDINE 20 MG PO TABS
20.0000 mg | ORAL_TABLET | Freq: Once | ORAL | Status: AC
  Administered 2018-01-10: 20 mg via ORAL

## 2018-01-10 MED ORDER — SUGAMMADEX SODIUM 200 MG/2ML IV SOLN
INTRAVENOUS | Status: DC | PRN
  Administered 2018-01-10: 80 mg via INTRAVENOUS

## 2018-01-10 MED ORDER — ONDANSETRON HCL 4 MG/2ML IJ SOLN
INTRAMUSCULAR | Status: DC | PRN
  Administered 2018-01-10: 4 mg via INTRAVENOUS

## 2018-01-10 MED ORDER — LIDOCAINE HCL (CARDIAC) 20 MG/ML IV SOLN
INTRAVENOUS | Status: DC | PRN
  Administered 2018-01-10: 40 mg via INTRAVENOUS

## 2018-01-10 MED ORDER — PROPOFOL 10 MG/ML IV BOLUS
INTRAVENOUS | Status: DC | PRN
  Administered 2018-01-10: 80 mg via INTRAVENOUS

## 2018-01-10 MED ORDER — CLINDAMYCIN PHOSPHATE 600 MG/50ML IV SOLN
600.0000 mg | Freq: Three times a day (TID) | INTRAVENOUS | Status: DC
  Administered 2018-01-10 – 2018-01-12 (×7): 600 mg via INTRAVENOUS
  Filled 2018-01-10 (×9): qty 50

## 2018-01-10 MED ORDER — FENTANYL CITRATE (PF) 100 MCG/2ML IJ SOLN
25.0000 ug | INTRAMUSCULAR | Status: AC | PRN
  Administered 2018-01-10 (×6): 25 ug via INTRAVENOUS

## 2018-01-10 MED ORDER — IOTHALAMATE MEGLUMINE 43 % IV SOLN
INTRAVENOUS | Status: DC | PRN
  Administered 2018-01-10: 15 mL via URETHRAL

## 2018-01-10 MED ORDER — MIDAZOLAM HCL 2 MG/2ML IJ SOLN
INTRAMUSCULAR | Status: DC | PRN
  Administered 2018-01-10: 2 mg via INTRAVENOUS

## 2018-01-10 MED ORDER — ROCURONIUM BROMIDE 100 MG/10ML IV SOLN
INTRAVENOUS | Status: DC | PRN
  Administered 2018-01-10: 20 mg via INTRAVENOUS

## 2018-01-10 MED ORDER — FENTANYL CITRATE (PF) 100 MCG/2ML IJ SOLN
INTRAMUSCULAR | Status: DC | PRN
  Administered 2018-01-10: 25 ug via INTRAVENOUS
  Administered 2018-01-10: 50 ug via INTRAVENOUS
  Administered 2018-01-10 (×3): 25 ug via INTRAVENOUS
  Administered 2018-01-10: 50 ug via INTRAVENOUS

## 2018-01-10 MED ORDER — LIDOCAINE HCL (PF) 2 % IJ SOLN
INTRAMUSCULAR | Status: AC
  Filled 2018-01-10: qty 10

## 2018-01-10 MED ORDER — ONDANSETRON HCL 4 MG/2ML IJ SOLN
INTRAMUSCULAR | Status: AC
  Filled 2018-01-10: qty 2

## 2018-01-10 MED ORDER — BELLADONNA ALKALOIDS-OPIUM 16.2-60 MG RE SUPP: 1.0000 | Freq: Four times a day (QID) | RECTAL | Status: DC | PRN

## 2018-01-10 MED ORDER — BUPIVACAINE HCL (PF) 0.5 % IJ SOLN
INTRAMUSCULAR | Status: AC
  Filled 2018-01-10: qty 30

## 2018-01-10 MED ORDER — BUPIVACAINE HCL 0.5 % IJ SOLN
INTRAMUSCULAR | Status: DC | PRN
  Administered 2018-01-10: 20 mL

## 2018-01-10 MED ORDER — CLINDAMYCIN PHOSPHATE 600 MG/50ML IV SOLN
INTRAVENOUS | Status: AC
  Administered 2018-01-10: 600 mg via INTRAVENOUS
  Filled 2018-01-10: qty 50

## 2018-01-10 MED ORDER — LACTATED RINGERS IV SOLN
INTRAVENOUS | Status: DC
  Administered 2018-01-10 (×2): via INTRAVENOUS

## 2018-01-10 MED ORDER — DIPHENHYDRAMINE HCL 50 MG/ML IJ SOLN: 12.5000 mg | Freq: Four times a day (QID) | INTRAMUSCULAR | Status: DC | PRN

## 2018-01-10 MED ORDER — ONDANSETRON HCL 4 MG/2ML IJ SOLN: 4.0000 mg | Freq: Once | INTRAMUSCULAR | Status: DC | PRN

## 2018-01-10 MED ORDER — HYDROMORPHONE HCL 1 MG/ML IJ SOLN
0.2500 mg | INTRAMUSCULAR | Status: AC | PRN
  Administered 2018-01-10 (×4): 0.25 mg via INTRAVENOUS

## 2018-01-10 MED ORDER — ONDANSETRON HCL 4 MG/2ML IJ SOLN
4.0000 mg | INTRAMUSCULAR | Status: DC | PRN
  Administered 2018-01-10 – 2018-01-11 (×2): 4 mg via INTRAVENOUS
  Filled 2018-01-10 (×2): qty 2

## 2018-01-10 MED ORDER — LIDOCAINE HCL (PF) 1 % IJ SOLN
INTRAMUSCULAR | Status: AC
  Filled 2018-01-10: qty 30

## 2018-01-10 MED ORDER — SUGAMMADEX SODIUM 200 MG/2ML IV SOLN
INTRAVENOUS | Status: AC
  Filled 2018-01-10: qty 2

## 2018-01-10 MED ORDER — DEXAMETHASONE SODIUM PHOSPHATE 10 MG/ML IJ SOLN
INTRAMUSCULAR | Status: DC | PRN
  Administered 2018-01-10: 5 mg via INTRAVENOUS

## 2018-01-10 MED ORDER — HYDROMORPHONE HCL 1 MG/ML IJ SOLN
INTRAMUSCULAR | Status: AC
  Administered 2018-01-10: 0.25 mg via INTRAVENOUS
  Filled 2018-01-10: qty 1

## 2018-01-10 MED ORDER — DOCUSATE SODIUM 100 MG PO CAPS
100.0000 mg | ORAL_CAPSULE | Freq: Two times a day (BID) | ORAL | Status: DC
  Administered 2018-01-10 – 2018-01-13 (×6): 100 mg via ORAL
  Filled 2018-01-10 (×7): qty 1

## 2018-01-10 MED ORDER — HYDROMORPHONE HCL 1 MG/ML IJ SOLN
0.2500 mg | INTRAMUSCULAR | Status: DC | PRN
  Administered 2018-01-10: 0.25 mg via INTRAVENOUS

## 2018-01-10 MED ORDER — SODIUM CHLORIDE 0.9 % IV SOLN
INTRAVENOUS | Status: DC
  Administered 2018-01-10 – 2018-01-11 (×2): via INTRAVENOUS

## 2018-01-10 MED ORDER — PHENYLEPHRINE HCL 10 MG/ML IJ SOLN
INTRAMUSCULAR | Status: DC | PRN
  Administered 2018-01-10 (×6): 100 ug via INTRAVENOUS

## 2018-01-10 MED ORDER — TAMSULOSIN HCL 0.4 MG PO CAPS
0.4000 mg | ORAL_CAPSULE | Freq: Every day | ORAL | Status: DC
  Administered 2018-01-11 – 2018-01-13 (×3): 0.4 mg via ORAL
  Filled 2018-01-10 (×3): qty 1

## 2018-01-10 MED ORDER — ACETAMINOPHEN 325 MG PO TABS: 650.0000 mg | ORAL_TABLET | ORAL | Status: DC | PRN

## 2018-01-10 MED ORDER — OXYCODONE-ACETAMINOPHEN 5-325 MG PO TABS
1.0000 | ORAL_TABLET | ORAL | Status: DC | PRN
  Administered 2018-01-10 – 2018-01-11 (×3): 1 via ORAL
  Filled 2018-01-10 (×3): qty 1

## 2018-01-10 MED ORDER — HEPARIN SODIUM (PORCINE) 5000 UNIT/ML IJ SOLN
5000.0000 [IU] | Freq: Three times a day (TID) | INTRAMUSCULAR | Status: DC
  Administered 2018-01-10 – 2018-01-12 (×7): 5000 [IU] via SUBCUTANEOUS
  Filled 2018-01-10 (×7): qty 1

## 2018-01-10 MED ORDER — MIDAZOLAM HCL 2 MG/2ML IJ SOLN
INTRAMUSCULAR | Status: AC
  Filled 2018-01-10: qty 2

## 2018-01-10 SURGICAL SUPPLY — 57 items
ADAPTER IRRIG TUBE 2 SPIKE SOL (ADAPTER) ×12 IMPLANT
BAG DRAIN CYSTO-URO LG1000N (MISCELLANEOUS) ×6 IMPLANT
BAG URO DRAIN 4000ML (MISCELLANEOUS) IMPLANT
BASKET ZERO TIP 1.9FR (BASKET) IMPLANT
BRUSH SCRUB EZ  4% CHG (MISCELLANEOUS)
BRUSH SCRUB EZ 4% CHG (MISCELLANEOUS) IMPLANT
BULB RESERV EVAC DRAIN JP 100C (MISCELLANEOUS) ×6 IMPLANT
CATH FOL 2WAY LX 16X30 (CATHETERS) ×6 IMPLANT
CATH FOL 2WAY LX 24X30 (CATHETERS) IMPLANT
CATH FOLEY 2W COUNCIL 5CC 16FR (CATHETERS) ×6 IMPLANT
CATH SET URETHRAL DILATOR (CATHETERS) ×6 IMPLANT
CATH URETL 5X70 OPEN END (CATHETERS) ×6 IMPLANT
CNTNR SPEC 2.5X3XGRAD LEK (MISCELLANEOUS) ×4
CONRAY 43 FOR UROLOGY 50M (MISCELLANEOUS) ×6 IMPLANT
CONT SPEC 4OZ STER OR WHT (MISCELLANEOUS) ×2
CONTAINER SPEC 2.5X3XGRAD LEK (MISCELLANEOUS) ×4 IMPLANT
DERMABOND ADVANCED (GAUZE/BANDAGES/DRESSINGS) ×2
DERMABOND ADVANCED .7 DNX12 (GAUZE/BANDAGES/DRESSINGS) ×4 IMPLANT
DRAIN CHANNEL JP 15F RND 16 (MISCELLANEOUS) ×6 IMPLANT
DRAPE LAPAROTOMY 77X122 PED (DRAPES) ×6 IMPLANT
DRAPE LAPAROTOMY TRNSV 106X77 (MISCELLANEOUS) ×6 IMPLANT
DRAPE UTILITY 15X26 TOWEL STRL (DRAPES) ×6 IMPLANT
ELECT LOOP 22F BIPOLAR SML (ELECTROSURGICAL) ×6
ELECTRODE LOOP 22F BIPOLAR SML (ELECTROSURGICAL) ×4 IMPLANT
FIBER LASER 550 (Laser) ×6 IMPLANT
GLOVE BIO SURGEON STRL SZ 6.5 (GLOVE) ×10 IMPLANT
GLOVE BIO SURGEON STRL SZ7.5 (GLOVE) ×36 IMPLANT
GLOVE BIO SURGEONS STRL SZ 6.5 (GLOVE) ×2
GOWN STRL REUS W/ TWL LRG LVL3 (GOWN DISPOSABLE) ×20 IMPLANT
GOWN STRL REUS W/TWL LRG LVL3 (GOWN DISPOSABLE) ×10
HOLDER FOLEY CATH W/STRAP (MISCELLANEOUS) ×6 IMPLANT
KIT TURNOVER CYSTO (KITS) ×6 IMPLANT
LOOP CUT BIPOLAR 24F LRG (ELECTROSURGICAL) IMPLANT
NEEDLE HYPO 25X1 1.5 SAFETY (NEEDLE) ×6 IMPLANT
PACK BASIN MINOR ARMC (MISCELLANEOUS) ×6 IMPLANT
PACK CYSTO AR (MISCELLANEOUS) ×6 IMPLANT
SENSORWIRE 0.038 NOT ANGLED (WIRE) ×6
SET CYSTO W/LG BORE CLAMP LF (SET/KITS/TRAYS/PACK) IMPLANT
SET IRRIG Y TYPE TUR BLADDER L (SET/KITS/TRAYS/PACK) ×6 IMPLANT
SET IRRIGATING DISP (SET/KITS/TRAYS/PACK) ×6 IMPLANT
SOL .9 NS 3000ML IRR  AL (IV SOLUTION) ×8
SOL .9 NS 3000ML IRR UROMATIC (IV SOLUTION) ×16 IMPLANT
SPONGE DRAIN TRACH 4X4 STRL 2S (GAUZE/BANDAGES/DRESSINGS) ×6 IMPLANT
SPONGE LAP 18X18 5 PK (GAUZE/BANDAGES/DRESSINGS) ×6 IMPLANT
SPONGE LAP 4X18 5PK (MISCELLANEOUS) ×6 IMPLANT
SURGILUBE 2OZ TUBE FLIPTOP (MISCELLANEOUS) ×6 IMPLANT
SUT ETHILON 3-0 FS-10 30 BLK (SUTURE) ×6
SUT PDS AB 0 CT1 27 (SUTURE) ×12 IMPLANT
SUT VIC AB 2-0 SH 27 (SUTURE) ×8
SUT VIC AB 2-0 SH 27XBRD (SUTURE) ×16 IMPLANT
SUTURE EHLN 3-0 FS-10 30 BLK (SUTURE) ×4 IMPLANT
SYR 10ML LL (SYRINGE) ×6 IMPLANT
SYR TOOMEY 50ML (SYRINGE) ×6 IMPLANT
SYRINGE IRR TOOMEY STRL 70CC (SYRINGE) ×12 IMPLANT
WATER STERILE IRR 1000ML POUR (IV SOLUTION) ×6 IMPLANT
WATER STERILE IRR 3000ML UROMA (IV SOLUTION) IMPLANT
WIRE SENSOR 0.038 NOT ANGLED (WIRE) ×4 IMPLANT

## 2018-01-10 NOTE — Anesthesia Post-op Follow-up Note (Signed)
Anesthesia QCDR form completed.        

## 2018-01-10 NOTE — Progress Notes (Signed)
15 minute call to floor. 

## 2018-01-10 NOTE — Anesthesia Procedure Notes (Signed)
Procedure Name: LMA Insertion Date/Time: 01/10/2018 9:15 AM Performed by: Irving BurtonBachich, Akio Hudnall, CRNA Pre-anesthesia Checklist: Patient identified, Emergency Drugs available, Suction available and Patient being monitored Patient Re-evaluated:Patient Re-evaluated prior to induction Oxygen Delivery Method: Circle system utilized Preoxygenation: Pre-oxygenation with 100% oxygen Induction Type: IV induction Ventilation: Mask ventilation without difficulty LMA: LMA inserted LMA Size: 4.0 Number of attempts: 1 Placement Confirmation: positive ETCO2 and breath sounds checked- equal and bilateral Tube secured with: Tape Dental Injury: Teeth and Oropharynx as per pre-operative assessment

## 2018-01-10 NOTE — Op Note (Addendum)
Date of procedure: 01/10/18  Preoperative diagnosis:  1. Bladder stone 2. BPH with bladder outlet obstruction  Postoperative diagnosis:  1. Same 2. Intraperitoneal bladder rupture  Procedure: 1. Cystoscopy 2. Bilateral retrograde pyelogram 3. Urethral dilation 4. Cystogram 5. Exploratory laparotomy 6. Open cystolithotomy 7. Cystotomy closure  Surgeon: Vanna Scotland, MD  Assistant: Irineo Axon, MD  Anesthesia: General  Complications: None  Intraoperative findings: Intraperitoneal cystotomy identified following urethral dilation in order to facilitate placement of resectoscope.  Converted to open procedure with ex lap, extension of cystotomy with open cystolithotomy, bladder closed uneventfully.  EBL: 50 cc  Specimens: bladder stone  Drains: 16 French Foley catheter, JP drain  Indication: Jesse Douglas is a 67 y.o. patient with 14 mm bladder stone presenting today for cystolitholapaxy and TURP.  After reviewing the management options for treatment, he elected to proceed with the above surgical procedure(s). We have discussed the potential benefits and risks of the procedure, side effects of the proposed treatment, the likelihood of the patient achieving the goals of the procedure, and any potential problems that might occur during the procedure or recuperation. Informed consent has been obtained.  Description of procedure:  The patient was taken to the operating room and general anesthesia was induced.  The patient was placed in the dorsal lithotomy position, prepped and draped in the usual sterile fashion, and preoperative antibiotics were administered. A preoperative time-out was performed.   A 21 French scope was advanced per urethra into the bladder.  The bladder was carefully inspected and noted to be free of any tumors or lesions.  There was some very subtle erythema in the posterior wall presumably from irritation from the stone.  The trigone was normal.  Bladder stone  was easily seen.  Attention was turned first to the left ureteral orifice which was cannulated using a 5 [place] open-ended ureteral catheter.  Gentle retrograde pyelogram was performed which revealed a delicate decompressed ureter and up filling defects.  Attention was then turned to the right ureteral orifice and the same exact procedure was performed.  This also revealed no filling defects or hydronephrosis.  Next, I attempted to advance a 99 French resectoscope in order to facilitate continuous irrigation and for the purpose of prostate resection.  Fortunately, the urethra would not accommodate this.  Male sounds were used within the meatus but is still unable to advance the scope under direct visualization due to narrowing within the bulbar urethra without obvious stricture.  It appeared that the overall caliber of the urethra was not going to facilitate placement of the scope.  I then attempted with a 24 French resectoscope and had the same difficulty.  At this point time, the decision was made to gently dilate the urethra over a guidewire in order to help facilitate at least a 24 [place] resectoscope which is the smallest scope available for continuous irrigation.  A wire was placed through the urethra under direct visualization and fluoroscopy study was used to confirm placement of the wire within the bladder.  I then performed serial dilation using a Cook urethral dilators over the wire.  I was able to dilate up to a 24 [place].  I then attempted to place the 24 French resectoscope sheath over the wire again under direct visualization but had the same difficulty within the pendulous urethra and buckling of the urethra with inability to advance the scope.  Finally, I decided to replace the 21 French rigid cystoscope back through the urethra and treat the stone  without continuous irrigation.  Upon entry into the bladder, and immediate defect was noted within the posterior dome concerning for perforation.  Upon  placing the scope closer, I was able to see fat and bowel content consistent with intraperitoneal bladder rupture.  There is no active bleeding noted.  The wire was seen traversing the small area, approximately 24 JamaicaFrench in size.  I then placed a 16 French Foley catheter into the bladder using fluoroscopy as guidance.  Contrast was injected into the bladder which initially showed no extravasation but ultimately a small wisp into the peritoneal cavity consistent with the known defect.  At this point time, the decision was made to proceed with expiratory laparotomy and repair of the cystotomy.  The patient was kept under general anesthesia but taken to a different operating room in order to accommodate an open procedure.  Catheter was prepped into the field.  Approximately 8 cm long midline lower abdominal incision was created and carried down to the fascia which was opened using Bovie electrocautery.  The rectus muscle was split in the midline.  The peritoneum was opened using Metzenbaum scissors at this point in time, copious amount of clear fluid was evacuated from the abdominal cavity.  The bowel adjacent to the dome of the bladder was carefully inspected and noted to be free of any injury.  This was packed superiorly and the bladder was inspected.  A small cystotomy was noted at the right posterior dome of the bladder.  Stay sutures were applied on either side of the cystotomy.  The cystotomy at the dome was widened slightly using Bovie electrocautery in order to accommodate my pinky.  I was unable to reach into the bladder with my small finger and deliver the stone with a small defect.  The cystotomy was then closed in 2 layers using running 2-0 Vicryl suture to close the mucosa and additional layer of 2-0 Vicryl to imbricate a seromuscular layer.  The bladder was then filled and there was a small leak noted at the apex of the incision.  An additional figure-of-eight was placed here.  Further testing of the  bladder with 120 cc of irrigant revealed no leak.  The remainder of the fluid within the abdomen was then evacuated.  A JP drain was placed creating a small stab incision in the left lower quadrant which was tunneled using Kellys.  Secured in place with a nylon.  At this point in time, the fascia was closed using running #1 PDS.  Wound was instilled with half percent Marcaine for the comfort.  The incision was closed using a running 4-0 Monocryl suture in a subcuticular fashion.  Additional dressing of Dermabond was applied.  Patient was then clean and dry, reversed from anesthesia and taken to PACU in stable condition.  Plan: Intraoperative details were provided to the patient's wife facilitated by a [place] translator.  All questions were answered.  All details of the bladder injury were disclosed.  Patient remained overnight for observation.  We will remove his JP in the morning and have him return in 7-10 days for cystogram followed by catheter removal.  Vanna ScotlandAshley Nelda Luckey, M.D.

## 2018-01-10 NOTE — Transfer of Care (Signed)
Immediate Anesthesia Transfer of Care Note  Patient: Orland PenmanBinh V Mavis  Procedure(s) Performed: CYSTOSCOPY WITH open cystolithotomy (N/A Bladder) CYSTOSCOPY WITH RETROGRADE PYELOGRAM (Bilateral Ureter) BLADDER REPAIR (N/A Bladder) URETHRA DILATATION (N/A Urethra) CYSTOGRAM (N/A )  Patient Location: PACU  Anesthesia Type:General  Level of Consciousness: sedated  Airway & Oxygen Therapy: Patient connected to face mask oxygen  Post-op Assessment: Post -op Vital signs reviewed and stable  Post vital signs: stable  Last Vitals:  Vitals:   01/10/18 0809 01/10/18 1118  BP: 137/90 119/76  Pulse: 98 76  Resp: 20 10  Temp: 36.4 C   SpO2: 97% 100%    Last Pain:  Vitals:   01/10/18 0809  TempSrc: Temporal         Complications: No apparent anesthesia complications

## 2018-01-10 NOTE — Anesthesia Preprocedure Evaluation (Signed)
Anesthesia Evaluation  Patient identified by MRN, date of birth, ID band Patient awake    Reviewed: Allergy & Precautions, NPO status , Patient's Chart, lab work & pertinent test results  Airway Mallampati: II  TM Distance: >3 FB     Dental   Pulmonary Current Smoker,    Pulmonary exam normal        Cardiovascular hypertension, Normal cardiovascular exam     Neuro/Psych negative neurological ROS  negative psych ROS   GI/Hepatic negative GI ROS, Neg liver ROS,   Endo/Other  negative endocrine ROS  Renal/GU negative Renal ROS  negative genitourinary   Musculoskeletal negative musculoskeletal ROS (+)   Abdominal Normal abdominal exam  (+)   Peds negative pediatric ROS (+)  Hematology negative hematology ROS (+)   Anesthesia Other Findings   Reproductive/Obstetrics                             Anesthesia Physical Anesthesia Plan  ASA: II  Anesthesia Plan: General   Post-op Pain Management:    Induction: Intravenous  PONV Risk Score and Plan:   Airway Management Planned: LMA and Oral ETT  Additional Equipment:   Intra-op Plan:   Post-operative Plan: Extubation in OR  Informed Consent: I have reviewed the patients History and Physical, chart, labs and discussed the procedure including the risks, benefits and alternatives for the proposed anesthesia with the patient or authorized representative who has indicated his/her understanding and acceptance.   Dental advisory given  Plan Discussed with: CRNA and Surgeon  Anesthesia Plan Comments:         Anesthesia Quick Evaluation

## 2018-01-10 NOTE — Plan of Care (Signed)
Pt is able to speak some english but mainly speaks vietnamese. Ipad interpreter was used by Chipper OmanBrandi SWOT RN to do the admission. Pt has had some pain, nausea, and bladder spasm.

## 2018-01-10 NOTE — Progress Notes (Signed)
Admission profile completed via interpreter (ipad) Jesse Douglas [phone]#[id number]

## 2018-01-10 NOTE — Interval H&P Note (Signed)
History and Physical Interval Note:  01/10/2018 8:47 AM  Jesse Douglas  has presented today for surgery, with the diagnosis of bladder stone, gross hematuria  The various methods of treatment have been discussed with the patient and family. After consideration of risks, benefits and other options for treatment, the patient has consented to  Procedure(s): CYSTOSCOPY WITH LITHOLAPAXY (N/A) CYSTOSCOPY WITH RETROGRADE PYELOGRAM (Bilateral) TRANSURETHRAL RESECTION OF THE PROSTATE (TURP) (N/A) as a surgical intervention .  The patient's history has been reviewed, patient examined, no change in status, stable for surgery.  I have reviewed the patient's chart and labs.  Questions were answered to the patient's satisfaction.    RRR CTAB  Vanna ScotlandAshley Piero Mustard

## 2018-01-10 NOTE — Anesthesia Procedure Notes (Signed)
Procedure Name: Intubation Date/Time: 01/10/2018 9:49 AM Performed by: Aline Brochure, CRNA Pre-anesthesia Checklist: Patient identified, Emergency Drugs available, Patient being monitored and Suction available Patient Re-evaluated:Patient Re-evaluated prior to induction Oxygen Delivery Method: Circle system utilized Preoxygenation: Pre-oxygenation with 100% oxygen Induction Type: Combination inhalational/ intravenous induction Laryngoscope Size: Mac and 3 Grade View: Grade II Tube type: Oral Tube size: 7.0 mm Number of attempts: 1 Airway Equipment and Method: Stylet Placement Confirmation: ETT inserted through vocal cords under direct vision,  positive ETCO2 and breath sounds checked- equal and bilateral Secured at: 21 cm Tube secured with: Tape Dental Injury: Teeth and Oropharynx as per pre-operative assessment

## 2018-01-10 NOTE — Anesthesia Postprocedure Evaluation (Addendum)
Anesthesia Post Note  Patient: Orland PenmanBinh V Lwin  Procedure(s) Performed: CYSTOSCOPY WITH open cystolithotomy (N/A Bladder) CYSTOSCOPY WITH RETROGRADE PYELOGRAM (Bilateral Ureter) BLADDER REPAIR (N/A Bladder) URETHRA DILATATION (N/A Urethra) CYSTOGRAM (N/A )  Patient location during evaluation: PACU Anesthesia Type: General Level of consciousness: awake Pain management: pain level controlled Vital Signs Assessment: post-procedure vital signs reviewed and stable Respiratory status: spontaneous breathing Cardiovascular status: blood pressure returned to baseline Anesthetic complications: no Comments: Noted preop and intraop that left upper lung not expanding as well as right despite good O2 sats...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  CT of the chest shows severe emphysema and bulla changes of left upper lobe and severe inflammatory changes.     Last Vitals:  Vitals:   01/10/18 1218 01/10/18 1233  BP: 111/66 117/62  Pulse: 82 71  Resp: 12 12  Temp:    SpO2: 100% 100%    Last Pain:  Vitals:   01/10/18 1233  TempSrc:   PainSc: 8                  Tyquarius Paglia

## 2018-01-11 ENCOUNTER — Encounter: Payer: Self-pay | Admitting: Urology

## 2018-01-11 ENCOUNTER — Telehealth: Payer: Self-pay | Admitting: Urology

## 2018-01-11 DIAGNOSIS — N9972 Accidental puncture and laceration of a genitourinary system organ or structure during other procedure: Secondary | ICD-10-CM

## 2018-01-11 LAB — BASIC METABOLIC PANEL
ANION GAP: 8 (ref 5–15)
BUN: 16 mg/dL (ref 6–20)
CALCIUM: 8.5 mg/dL — AB (ref 8.9–10.3)
CO2: 25 mmol/L (ref 22–32)
Chloride: 104 mmol/L (ref 101–111)
Creatinine, Ser: 0.81 mg/dL (ref 0.61–1.24)
Glucose, Bld: 95 mg/dL (ref 65–99)
Potassium: 4.5 mmol/L (ref 3.5–5.1)
Sodium: 137 mmol/L (ref 135–145)

## 2018-01-11 MED ORDER — OXYBUTYNIN CHLORIDE 5 MG PO TABS: 5.0000 mg | ORAL_TABLET | Freq: Three times a day (TID) | ORAL | 0 refills | Status: DC | PRN

## 2018-01-11 MED ORDER — HYDROCODONE-ACETAMINOPHEN 5-325 MG PO TABS: 1.0000 | ORAL_TABLET | Freq: Four times a day (QID) | ORAL | 0 refills | Status: DC | PRN

## 2018-01-11 MED ORDER — IPRATROPIUM-ALBUTEROL 0.5-2.5 (3) MG/3ML IN SOLN
3.0000 mL | Freq: Four times a day (QID) | RESPIRATORY_TRACT | Status: DC | PRN
  Administered 2018-01-11 – 2018-01-12 (×2): 3 mL via RESPIRATORY_TRACT
  Filled 2018-01-11 (×2): qty 3

## 2018-01-11 MED ORDER — DOCUSATE SODIUM 100 MG PO CAPS: 100.0000 mg | ORAL_CAPSULE | Freq: Two times a day (BID) | ORAL | 0 refills | Status: AC

## 2018-01-11 NOTE — Care Management Important Message (Signed)
Important Message  Patient Details  Name: Jesse Douglas MRN: [id number] Date of Birth: 01/13/1951   Medicare Important Message Given:  Yes    Chapman FitchBOWEN, Norena Bratton T, RN 01/11/2018, 3:29 PM

## 2018-01-11 NOTE — Care Management Obs Status (Signed)
MEDICARE OBSERVATION STATUS NOTIFICATION   Patient Details  Name: Jesse Douglas MRN: [id number] Date of Birth: 12/06/1950   Medicare Observation Status Notification Given:  Yes    Chapman FitchBOWEN, Slyvester Latona T, RN 01/11/2018, 3:29 PM

## 2018-01-11 NOTE — Progress Notes (Signed)
Pt was sating around 90-92 on room air with heart rate 110-115. Dr Apolinar Junesbrandon was notified and pt was put back on 1L 02

## 2018-01-11 NOTE — Progress Notes (Signed)
Urology Consult Follow Up  Subjective: Pain well controlled.  Ambulating.  Wean off 02 earlier today with sats in the low 90s.  Denies SOB.  Reviewed intraoperative complication of bladder perforation and repair.  All questions answered.  Anti-infectives: Anti-infectives (From admission, onward)   Start     Dose/Rate Route Frequency Ordered Stop   01/10/18 1400  clindamycin (CLEOCIN) IVPB 600 mg     600 mg 100 mL/hr over 30 Minutes Intravenous Every 8 hours 01/10/18 1139     01/10/18 0754  ciprofloxacin (CIPRO) 400 MG/200ML IVPB    Comments:  Mikey Bussing   : cabinet override      01/10/18 0754 01/10/18 0909   01/10/18 0751  ceFAZolin (ANCEF) 2-4 GM/100ML-% IVPB  Status:  Discontinued    Comments:  Mikey Bussing   : cabinet override      01/10/18 0751 01/10/18 0800   01/09/18 2203  ciprofloxacin (CIPRO) IVPB 400 mg     400 mg 200 mL/hr over 60 Minutes Intravenous 60 min pre-op 01/09/18 2203 01/10/18 0929      Current Facility-Administered Medications  Medication Dose Route Frequency Provider Last Rate Last Dose  . 0.9 %  sodium chloride infusion   Intravenous Continuous Vanna Scotland, MD 50 mL/hr at 01/11/18 1305    . acetaminophen (TYLENOL) tablet 650 mg  650 mg Oral Q4H PRN Vanna Scotland, MD      . clindamycin (CLEOCIN) IVPB 600 mg  600 mg Intravenous Q8H Vanna Scotland, MD   Stopped at 01/11/18 1454  . diphenhydrAMINE (BENADRYL) injection 12.5 mg  12.5 mg Intravenous Q6H PRN Vanna Scotland, MD       Or  . diphenhydrAMINE (BENADRYL) 12.5 MG/5ML elixir 12.5 mg  12.5 mg Oral Q6H PRN Vanna Scotland, MD      . docusate sodium (COLACE) capsule 100 mg  100 mg Oral BID Vanna Scotland, MD   100 mg at 01/11/18 0859  . heparin injection 5,000 Units  5,000 Units Subcutaneous Q8H Vanna Scotland, MD   5,000 Units at 01/11/18 1306  . ipratropium-albuterol (DUONEB) 0.5-2.5 (3) MG/3ML nebulizer solution 3 mL  3 mL Nebulization Q6H PRN Vanna Scotland, MD   3 mL at 01/11/18 1703  .  morphine 2 MG/ML injection 2-4 mg  2-4 mg Intravenous Q2H PRN Vanna Scotland, MD      . ondansetron [facility]) injection 4 mg  4 mg Intravenous Q4H PRN Vanna Scotland, MD   4 mg at 01/11/18 0513  . opium-belladonna (B&O SUPPRETTES) 16.2-60 MG suppository 1 suppository  1 suppository Rectal Q6H PRN Vanna Scotland, MD      . oxybutynin (DITROPAN) tablet 5 mg  5 mg Oral Q8H PRN Vanna Scotland, MD   5 mg at 01/10/18 1542  . oxyCODONE-acetaminophen (PERCOCET/ROXICET) 5-325 MG per tablet 1-2 tablet  1-2 tablet Oral Q4H PRN Vanna Scotland, MD   1 tablet at 01/11/18 1503  . tamsulosin (FLOMAX) capsule 0.4 mg  0.4 mg Oral Daily Vanna Scotland, MD   0.4 mg at 01/11/18 0859     Objective: Vital signs in last 24 hours: Temp:  [97.5 F (36.4 C)-98.6 F (37 C)] 97.5 F (36.4 C) (02/13 1447) Pulse Rate:  [59-116] 116 (02/13 1526) Resp:  [18-24] 24 (02/13 1447) BP: (122-149)/(60-72) 148/72 (02/13 1447) SpO2:  [88 %-100 %] 94 % (02/13 1700)  Intake/Output from previous day: 02/12 0701 - 02/13 0700 In: 1819 [P.O.:240; I.V.:1479; IV Piggyback:100] Out: 655 [Urine:650; Drains:5] Intake/Output this shift: Total I/O In: 120 [P.O.:120] Out: 1975 [  Urine:1975]   Physical Exam  Constitutional: He is oriented to person, place, and time and well-developed, well-nourished, and in no distress.  HENT:  Head: Normocephalic and atraumatic.  Pulmonary/Chest: Effort normal and breath sounds normal. No respiratory distress. He has no wheezes.  Abdominal: Soft. He exhibits no distension and no mass. There is no guarding.  Incision clean dry and intact  Genitourinary:  Genitourinary Comments: Foley draining light pink urine  Neurological: He is alert and oriented to person, place, and time.  Skin: Skin is warm and dry.  Vitals reviewed.    Lab Results:  No results for input(s): WBC, HGB, HCT, PLT in the last 72 hours. BMET Recent Labs    01/11/18 0419  NA 137  K 4.5  CL 104  CO2 25  GLUCOSE 95   BUN 16  CREATININE 0.81  CALCIUM 8.5*    Studies/Results: No results found.   Assessment: s/p Procedure(s): CYSTOSCOPY WITH open cystolithotomy CYSTOSCOPY WITH RETROGRADE PYELOGRAM BLADDER REPAIR URETHRA DILATATION CYSTOGRAM  Plan: -Duo nebs ordered, encourage IS, wean 02 as possible (underlying lung disease) -If continues to have an oxygen requirement, will obtain medicine consult in a.m. -Drain creatinine pending, will likely remove JP tomorrow -Maintain Foley for 10 days, cysto prior to removal -Ambulate     LOS: 0 days    Vanna Scotlandshley Cleone Hulick 01/11/2018

## 2018-01-11 NOTE — Telephone Encounter (Signed)
This patient will need a cystogram late next week and then follow-up with me thereafter on the same day.  He will need a [place] translator for both.  Order placed here.    He is currently inpatient and will be discharged later today.  It would be helpful to have this set up so that the information could be reflected on his discharge summary.  Vanna ScotlandAshley Joshuwa Vecchio, MD

## 2018-01-11 NOTE — Progress Notes (Signed)
Pt put back on 1L oxygen, per doctor/nurse conversation

## 2018-01-11 NOTE — Discharge Instructions (Signed)
· Activity:  You are encouraged to ambulate frequently (about every hour during waking hours) to help prevent blood clots from forming in your legs or lungs.  However, you should not engage in any heavy lifting (> 5-10 lbs), strenuous activity, or straining. ° °· Diet: You should advance your diet as instructed by your physician.  It will be normal to have some bloating, nausea, and abdominal discomfort intermittently. ° °· Prescriptions:  You will be provided a prescription for pain medication to take as needed.  If your pain is not severe enough to require the prescription pain medication, you may take extra strength Tylenol instead which will have less side effects.  You should also take a prescribed stool softener to avoid straining with bowel movements as the prescription pain medication may constipate you. ° °· Incisions: You may remove your dressing bandages 48 hours after surgery if not removed in the hospital.  You will either have some small staples or special tissue glue at each of the incision sites. Once the bandages are removed (if present), the incisions may stay open to air.  You may start showering (but not soaking or bathing in water) the 2nd day after surgery and the incisions simply need to be patted dry after the shower.  No additional care is needed. ° °What to call us about: You should call the office if you develop fever > 101 or develop persistent vomiting, redness or draining around your incision, or any other concerning symptoms.   ° °[facility] °[address], [address] °[place], [place] [address] °[phone] ° ° °Indwelling Urinary Catheter Care, Adult °Take good care of your catheter to keep it working and to prevent problems. °How to wear your catheter °Attach your catheter to your leg with tape (adhesive tape) or a leg strap. Make sure it is not too tight. If you use tape, remove any bits of tape that are already on the catheter. °How to wear a drainage  bag °You should have: °· A large overnight bag. °· A small leg bag. ° °Overnight Bag °You may wear the overnight bag at any time. Always keep the bag below the level of your bladder but off the floor. When you sleep, put a clean plastic bag in a wastebasket. Then hang the bag inside the wastebasket. °Leg Bag °Never wear the leg bag at night. Always wear the leg bag below your knee. Keep the leg bag secure with a leg strap or tape. °How to care for your skin °· Clean the skin around the catheter at least once every day. °· Shower every day. Do not take baths. °· Put creams, lotions, or ointments on your genital area only as told by your doctor. °· Do not use powders, sprays, or lotions on your genital area. °How to clean your catheter and your skin °1. Wash your hands with soap and water. °2. Wet a washcloth in warm water and gentle (mild) soap. °3. Use the washcloth to clean the skin where the catheter enters your body. Clean downward and wipe away from the catheter in small circles. Do not wipe toward the catheter. °4. Pat the area dry with a clean towel. Make sure to clean off all soap. °How to care for your drainage bags °Empty your drainage bag when it is ?-½ full or at least 2-3 times a day. Replace your drainage bag once a month or sooner if it starts to smell bad or look dirty. Do not clean your   drainage bag unless told by your doctor. °Emptying a drainage bag ° °Supplies Needed °· Rubbing alcohol. °· Gauze pad or cotton ball. °· Tape or a leg strap. ° °Steps °1. Wash your hands with soap and water. °2. Separate (detach) the bag from your leg. °3. Hold the bag over the toilet or a clean container. Keep the bag below your hips and bladder. This stops pee (urine) from going back into the tube. °4. Open the pour spout at the bottom of the bag. °5. Empty the pee into the toilet or container. Do not let the pour spout touch any surface. °6. Put rubbing alcohol on a gauze pad or cotton ball. °7. Use the gauze pad  or cotton ball to clean the pour spout. °8. Close the pour spout. °9. Attach the bag to your leg with tape or a leg strap. °10. Wash your hands. ° °Changing a drainage bag °Supplies Needed °· Alcohol wipes. °· A clean drainage bag. °· Adhesive tape or a leg strap. ° °Steps °1. Wash your hands with soap and water. °2. Separate the dirty bag from your leg. °3. Pinch the rubber catheter with your fingers so that pee does not spill out. °4. Separate the catheter tube from the drainage tube where these tubes connect (at the connection valve). Do not let the tubes touch any surface. °5. Clean the end of the catheter tube with an alcohol wipe. Use a different alcohol wipe to clean the end of the drainage tube. °6. Connect the catheter tube to the drainage tube of the clean bag. °7. Attach the new bag to the leg with adhesive tape or a leg strap. °8. Wash your hands. ° °How to prevent infection and other problems °· Never pull on your catheter or try to remove it. Pulling can damage tissue in your body. °· Always wash your hands before and after touching your catheter. °· If a leg strap gets wet, replace it with a dry one. °· Drink enough fluids to keep your pee clear or pale yellow, or as told by your doctor. °· Do not let the drainage bag or tubing touch the floor. °· Wear cotton underwear. °· If you are male, wipe from front to back after you poop (have a bowel movement). °· Check on the catheter often to make sure it works and the tubing is not twisted. °Get help if: °· Your pee is cloudy. °· Your pee smells unusually bad. °· Your pee is not draining into the bag. °· Your tube gets clogged. °· Your catheter starts to leak. °· Your bladder feels full. °Get help right away if: °· You have redness, swelling, or pain where the catheter enters your body. °· You have fluid, pus, or a bad smell coming from the area where the catheter enters your body. °· The area where the catheter enters your body feels warm. °· You have a  fever. °· You have pain in your: °? Stomach (abdomen). °? Legs. °? Lower back. °? Bladder. °· You see blood fill the catheter. °· Your pee is pink or red. °· You feel sick to your stomach (nauseous). °· You throw up (vomit). °· You have chills. °· Your catheter gets pulled out. °This information is not intended to replace advice given to you by your health care provider. Make sure you discuss any questions you have with your health care provider. °Document Released: 03/12/2013 Document Revised: 10/13/2016 Document Reviewed: 04/30/2014 °Elsevier Interactive Patient Education © 2018 [employer] ° °

## 2018-01-11 NOTE — Discharge Summary (Addendum)
Date of admission: 01/10/2018  Date of discharge:  01/12/2018  Admission diagnosis: Bladder stone  Discharge diagnosis: Same as above, intraoperative bladder perforation  Secondary diagnoses:  Patient Active Problem List   Diagnosis Date Noted  . Bladder stone 01/10/2018    History and Physical: For full details, please see admission history and physical. Briefly, Jesse Douglas is a 67 y.o. year old patient with bladder stone was scheduled to undergo TURP/cystolitholapaxy which was complicated by a bladder perforation requiring open repair with open cystolithotomy.  He was admitted afterwards for postoperative care.  Hospital Course: Patient tolerated the procedure well.  He was then transferred to the floor after an uneventful PACU stay.  His hospital course was uncomplicated.  On postop day 1, he had a low oxygen requirement which resolved with conservative spirometry, ambulation, and duo nebs.  He did have decreased 02 sats when ambulating.  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   Medicine was consult and felt this was likely his baseline.  Oral prednisone and nebs recommended on discharge with pulmonology follow up. On POD#2 he had met discharge criteria: was eating a regular diet, was up and ambulating independently,  pain was well controlled, and was ready to for discharge.  Creatinine was consistent with serum.  This was removed prior to discharge.  Foley catheter teaching was done prior to discharge as well.    Physical Exam Constitutional: He is oriented to person, place, and time and well-developed, well-nourished, and in no distress.  HENT:  Head: Normocephalic and atraumatic.  Pulmonary/Chest: Effort normal and breath sounds normal. No respiratory distress. He has no wheezes.  Abdominal: Soft. He exhibits no distension and no mass. There is no guarding.  Incision clean dry and intact  Genitourinary:  Genitourinary Comments: Foley draining clear urine, no  clots Neurological: He is alert and oriented to person, place, and time.  Skin: Skin is warm and dry.  Vitals reviewed.     Laboratory values:  No results for input(s): WBC, HGB, HCT in the last 72 hours. Recent Labs    01/11/18 0419  NA 137  K 4.5  CL 104  CO2 25  GLUCOSE 95  BUN 16  CREATININE 0.81  CALCIUM 8.5*   No results for input(s): LABPT, INR in the last 72 hours. No results for input(s): LABURIN in the last 72 hours. Results for orders placed or performed in visit on 12/30/17  Microscopic Examination     Status: Abnormal   Collection Time: 12/30/17 10:26 AM  Result Value Ref Range Status   WBC, UA None seen 0 - 5 /hpf Final   RBC, UA 11-30 (A) 0 - 2 /hpf Final   Epithelial Cells (non renal) None seen 0 - 10 /hpf Final   Bacteria, UA None seen None seen/Few Final  CULTURE, URINE COMPREHENSIVE     Status: None   Collection Time: 12/30/17 11:26 AM  Result Value Ref Range Status   Urine Culture, Comprehensive Final report  Final   Organism ID, Bacteria Comment  Final    Comment: No growth in 36 - 48 hours.    Disposition: Home   Discharge instruction: Activity:  You are encouraged to ambulate frequently (about every hour during waking hours) to help prevent blood clots from forming in your legs or lungs.  However, you should not engage in any heavy lifting (> 5-10 lbs), strenuous activity, or straining.   Diet: You should advance your diet as instructed by your physician.  It will be normal to have some bloating, nausea, and abdominal discomfort intermittently.   Prescriptions:  You will be provided a prescription for pain medication to take as needed.  If your pain is not severe enough to require the prescription pain medication, you may take extra strength Tylenol instead which will have less side effects.  You should also take a prescribed stool softener to avoid straining with bowel movements as the prescription pain medication may constipate  you.   Incisions: You may remove your dressing bandages 48 hours after surgery if not removed in the hospital.  You will either have some small staples or special tissue glue at each of the incision sites. Once the bandages are removed (if present), the incisions may stay open to air.  You may start showering (but not soaking or bathing in water) the 2nd day after surgery and the incisions simply need to be patted dry after the shower.  No additional care is needed.  What to call us about: You should call the office if you develop fever > 101 or develop persistent vomiting, redness or draining around your incision, or any other concerning symptoms.    [facility] [address], [place], [place] [address] [phone]   Routine Foley care Discharge medications:  Allergies as of 01/11/2018      Reactions   Penicillins Diarrhea, Nausea And Vomiting, Other (See Comments)   Has patient had a PCN reaction causing immediate rash, facial/tongue/throat swelling, SOB or lightheadedness with hypotension: Yes Has patient had a PCN reaction causing severe rash involving mucus membranes or skin necrosis: No Has patient had a PCN reaction that required hospitalization: No Has patient had a PCN reaction occurring within the last 10 years: No If all of the above answers are "NO", then may proceed with Cephalosporin use.      Medication List    TAKE these medications   docusate sodium 100 MG capsule Commonly known as:  COLACE Take 1 capsule (100 mg total) by mouth 2 (two) times daily.   HYDROcodone-acetaminophen 5-325 MG tablet Commonly known as:  NORCO/VICODIN Take 1-2 tablets by mouth every 6 (six) hours as needed for moderate pain.   ibuprofen 200 MG tablet Commonly known as:  ADVIL,MOTRIN Take 400 mg by mouth every 6 (six) hours as needed for headache or moderate pain.   MUSCLE RUB EX Apply 1 application topically daily.   oxybutynin 5 MG  tablet Commonly known as:  DITROPAN Take 1 tablet (5 mg total) by mouth every 8 (eight) hours as needed for bladder spasms.   tamsulosin 0.4 MG Caps capsule Commonly known as:  FLOMAX Take 1 capsule (0.4 mg total) by mouth daily.       Followup:  Follow-up Information    Jesse Espy, MD In 10 days.   Specialty:  Urology Why:  cystogram Contact information: [place] [id number] [phone]            You need to follow up with Pulmonology as outpatient.

## 2018-01-12 ENCOUNTER — Inpatient Hospital Stay: Payer: Medicare Other

## 2018-01-12 DIAGNOSIS — F17210 Nicotine dependence, cigarettes, uncomplicated: Secondary | ICD-10-CM | POA: Diagnosis present

## 2018-01-12 DIAGNOSIS — N401 Enlarged prostate with lower urinary tract symptoms: Secondary | ICD-10-CM | POA: Diagnosis present

## 2018-01-12 DIAGNOSIS — J9621 Acute and chronic respiratory failure with hypoxia: Secondary | ICD-10-CM | POA: Diagnosis not present

## 2018-01-12 DIAGNOSIS — N3289 Other specified disorders of bladder: Secondary | ICD-10-CM | POA: Diagnosis present

## 2018-01-12 DIAGNOSIS — Z5331 Laparoscopic surgical procedure converted to open procedure: Secondary | ICD-10-CM | POA: Diagnosis not present

## 2018-01-12 DIAGNOSIS — N138 Other obstructive and reflux uropathy: Secondary | ICD-10-CM | POA: Diagnosis present

## 2018-01-12 DIAGNOSIS — N210 Calculus in bladder: Secondary | ICD-10-CM | POA: Diagnosis present

## 2018-01-12 DIAGNOSIS — R000 Tachycardia, unspecified: Secondary | ICD-10-CM

## 2018-01-12 DIAGNOSIS — J449 Chronic obstructive pulmonary disease, unspecified: Secondary | ICD-10-CM | POA: Diagnosis present

## 2018-01-12 DIAGNOSIS — I10 Essential (primary) hypertension: Secondary | ICD-10-CM | POA: Diagnosis present

## 2018-01-12 DIAGNOSIS — R310 Gross hematuria: Secondary | ICD-10-CM | POA: Diagnosis present

## 2018-01-12 DIAGNOSIS — N9971 Accidental puncture and laceration of a genitourinary system organ or structure during a genitourinary system procedure: Secondary | ICD-10-CM | POA: Diagnosis present

## 2018-01-12 DIAGNOSIS — R531 Weakness: Secondary | ICD-10-CM | POA: Diagnosis present

## 2018-01-12 DIAGNOSIS — R0600 Dyspnea, unspecified: Secondary | ICD-10-CM | POA: Diagnosis not present

## 2018-01-12 LAB — STONE ANALYSIS
CA OXALATE, MONOHYDR.: 30 %
Ca Oxalate,Dihydrate: 35 %
Ca phos cry stone ql IR: 35 %
STONE WEIGHT KSTONE: 981.1 mg

## 2018-01-12 LAB — CREATININE, BODY FLUID OTHER: Creatinine, Body Fluid: 0.9 mg/dL

## 2018-01-12 MED ORDER — FLUTICASONE-SALMETEROL 115-21 MCG/ACT IN AERO: 2.0000 | INHALATION_SPRAY | Freq: Two times a day (BID) | RESPIRATORY_TRACT | 0 refills | Status: DC

## 2018-01-12 MED ORDER — PREDNISONE 10 MG (21) PO TBPK: 10.0000 mg | ORAL_TABLET | Freq: Every day | ORAL | 0 refills | Status: DC

## 2018-01-12 MED ORDER — TIOTROPIUM BROMIDE MONOHYDRATE 18 MCG IN CAPS: 18.0000 ug | ORAL_CAPSULE | Freq: Every day | RESPIRATORY_TRACT | 1 refills | Status: AC

## 2018-01-12 MED ORDER — IOPAMIDOL (ISOVUE-370) INJECTION 76%
75.0000 mL | Freq: Once | INTRAVENOUS | Status: AC | PRN
  Administered 2018-01-12: 75 mL via INTRAVENOUS

## 2018-01-12 MED ORDER — PREDNISONE 50 MG PO TABS
50.0000 mg | ORAL_TABLET | Freq: Every day | ORAL | Status: DC
  Administered 2018-01-12: 50 mg via ORAL
  Filled 2018-01-12: qty 1

## 2018-01-12 MED ORDER — METOPROLOL TARTRATE 25 MG PO TABS
12.5000 mg | ORAL_TABLET | Freq: Two times a day (BID) | ORAL | Status: DC
  Administered 2018-01-12: 12.5 mg via ORAL
  Filled 2018-01-12: qty 0.5
  Filled 2018-01-12 (×2): qty 1

## 2018-01-12 MED ORDER — ALBUTEROL SULFATE HFA 108 (90 BASE) MCG/ACT IN AERS: 2.0000 | INHALATION_SPRAY | Freq: Four times a day (QID) | RESPIRATORY_TRACT | 1 refills | Status: AC | PRN

## 2018-01-12 NOTE — Progress Notes (Signed)
MD aware of pt's desat while ambulating. MD to put orders in for cxray and medicine consult. Pt given breathing treatment and encouraged to use IS.

## 2018-01-12 NOTE — Telephone Encounter (Signed)
Both apps have been scheduled for 01-19-18 He is having his cystogram @ 9:00 and seeing you @ 11:00 This was the only time I could get him scheduled I hope that is ok with you? I spoke with the patient through the language kine and verified the apps with him.  Marcelino DusterMichelle

## 2018-01-12 NOTE — Progress Notes (Signed)
MD aware of elevated HR while ambulating.

## 2018-01-12 NOTE — Progress Notes (Signed)
Urology Consult Follow Up  Subjective: Catheter draining well.  Drain creatinine consistent with serum, removed.  Improvement in oxygen saturations at rest but continues to desat with ambulation.  Also significant tachycardia with exertion.  Chest x-ray ordered, consistent with known underlying COPD.  Medicine consult.  Initially anticipated discharge today, but likely needs additional workup at this point in time.  Anti-infectives: Anti-infectives (From admission, onward)   Start     Dose/Rate Route Frequency Ordered Stop   01/10/18 1400  clindamycin (CLEOCIN) IVPB 600 mg     600 mg 100 mL/hr over 30 Minutes Intravenous Every 8 hours 01/10/18 1139     01/10/18 0754  ciprofloxacin (CIPRO) 400 MG/200ML IVPB    Comments:  Mikey Bussing   : cabinet override      01/10/18 0754 01/10/18 0909   01/10/18 0751  ceFAZolin (ANCEF) 2-4 GM/100ML-% IVPB  Status:  Discontinued    Comments:  Mikey Bussing   : cabinet override      01/10/18 0751 01/10/18 0800   01/09/18 2203  ciprofloxacin (CIPRO) IVPB 400 mg     400 mg 200 mL/hr over 60 Minutes Intravenous 60 min pre-op 01/09/18 2203 01/10/18 0929      Current Facility-Administered Medications  Medication Dose Route Frequency Provider Last Rate Last Dose  . acetaminophen (TYLENOL) tablet 650 mg  650 mg Oral Q4H PRN Vanna Scotland, MD      . clindamycin (CLEOCIN) IVPB 600 mg  600 mg Intravenous Q8H Vanna Scotland, MD   Stopped at 01/12/18 1526  . diphenhydrAMINE (BENADRYL) injection 12.5 mg  12.5 mg Intravenous Q6H PRN Vanna Scotland, MD       Or  . diphenhydrAMINE (BENADRYL) 12.5 MG/5ML elixir 12.5 mg  12.5 mg Oral Q6H PRN Vanna Scotland, MD      . docusate sodium (COLACE) capsule 100 mg  100 mg Oral BID Vanna Scotland, MD   100 mg at 01/12/18 0851  . heparin injection 5,000 Units  5,000 Units Subcutaneous Q8H Vanna Scotland, MD   5,000 Units at 01/12/18 1457  . ipratropium-albuterol (DUONEB) 0.5-2.5 (3) MG/3ML nebulizer solution 3 mL  3 mL  Nebulization Q6H PRN Vanna Scotland, MD   3 mL at 01/12/18 1058  . metoprolol tartrate (LOPRESSOR) tablet 12.5 mg  12.5 mg Oral BID Gouru, Aruna, MD   12.5 mg at 01/12/18 1619  . morphine 2 MG/ML injection 2-4 mg  2-4 mg Intravenous Q2H PRN Vanna Scotland, MD      . ondansetron [facility]) injection 4 mg  4 mg Intravenous Q4H PRN Vanna Scotland, MD   4 mg at 01/11/18 0513  . opium-belladonna (B&O SUPPRETTES) 16.2-60 MG suppository 1 suppository  1 suppository Rectal Q6H PRN Vanna Scotland, MD      . oxybutynin (DITROPAN) tablet 5 mg  5 mg Oral Q8H PRN Vanna Scotland, MD   5 mg at 01/10/18 1542  . oxyCODONE-acetaminophen (PERCOCET/ROXICET) 5-325 MG per tablet 1-2 tablet  1-2 tablet Oral Q4H PRN Vanna Scotland, MD   1 tablet at 01/11/18 1503  . predniSONE (DELTASONE) tablet 50 mg  50 mg Oral Q breakfast Gouru, Aruna, MD   50 mg at 01/12/18 1619  . tamsulosin (FLOMAX) capsule 0.4 mg  0.4 mg Oral Daily Vanna Scotland, MD   0.4 mg at 01/12/18 0851     Objective: Vital signs in last 24 hours: Temp:  [98.3 F (36.8 C)-98.7 F (37.1 C)] 98.3 F (36.8 C) (02/14 1443) Pulse Rate:  [100-160] 160 (02/14 1540) Resp:  [18-20]  20 (02/14 1443) BP: (113-129)/(67-79) 116/77 (02/14 1443) SpO2:  [85 %-96 %] 94 % (02/14 1540)  Intake/Output from previous day: 02/13 0701 - 02/14 0700 In: 1085 [P.O.:120; I.V.:890; IV Piggyback:75] Out: 3417 [Urine:3400; Drains:17] Intake/Output this shift: Total I/O In: 170 [P.O.:120; IV Piggyback:50] Out: 325 [Urine:325]   Physical Exam  Constitutional: He is oriented to person, place, and time and well-developed, well-nourished, and in no distress.  HENT:  Head: Normocephalic and atraumatic.  Pulmonary/Chest: Effort normal and breath sounds normal. No respiratory distress. He has no wheezes.  Abdominal: Soft. He exhibits no distension and no mass. There is no guarding.  Incision clean dry and intact  Genitourinary:  Genitourinary Comments: Foley draining  light pink urine  Neurological: He is alert and oriented to person, place, and time.  Skin: Skin is warm and dry.  Vitals reviewed.    Lab Results:  No results for input(s): WBC, HGB, HCT, PLT in the last 72 hours. BMET Recent Labs    01/11/18 0419  NA 137  K 4.5  CL 104  CO2 25  GLUCOSE 95  BUN 16  CREATININE 0.81  CALCIUM 8.5*    Studies/Results: Dg Chest 2 View  Result Date: 01/12/2018 CLINICAL DATA:  Decreased oxygen saturation EXAM: CHEST  2 VIEW COMPARISON:  Chest radiograph May 29, 2015 and chest CT January 22, 2016 FINDINGS: There is underlying COPD. The lungs are somewhat hyperexpanded. 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 There are scattered areas of scarring elsewhere in the lungs. There is evidence of bullous disease throughout much of the left lung, stable. There is chronic blunting of each costophrenic angle. There is no edema or consolidation. Heart size is normal. The pulmonary vascularity on the left is distorted by the cicatrization. Pulmonary vascular on the right appears unremarkable given the underlying COPD. There is aortic atherosclerosis. No adenopathy. No bone lesions. 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 There is no frank edema or consolidation. The heart size is normal. Pulmonary vascularity is stable. No adenopathy evident. There is aortic atherosclerosis. Aortic Atherosclerosis (ICD10-I70.0) and Emphysema (ICD10-J43.9). Electronically Signed   By: Bretta BangWilliam  Woodruff III M.D.   On: 01/12/2018 11:49     Assessment: POD 2 s/p open cystolithotomy/repair of intraoperative bladder rupture  Plan: -Appreciate med consult for ongoing pulmonary issues and tachycardia, will likely order a CTPA to rule out  PE although lower on differential -drain removed -Maintain Foley for 10 days, cysto prior to removal -Ambulate    LOS: 0 days    Vanna Scotlandshley Juanjose Mojica 01/12/2018

## 2018-01-12 NOTE — Consult Note (Addendum)
Reason for Consult: No chief complaint on file.  Referring Physician: Erlene Quan, MD   HPI Jesse Douglas is an 67 y.o. male.  Admitted to Dr. Erlene Quan , urologist service for intraoperative complication of Bladder perforation and repair.  Patient had cystoscopy with open cystolithotomy and bladder repair done.  Hospitalist team is consulted for hypoxia.  During my examination patient is resting comfortably.  Denies any chest tightness or shortness of breath.  Patient also admits that he has chronic shortness of breath for several years because he did smoke in the past.  Patient started smoking at age 48 and smoked until age 89 , used to smoke  1 pack a day, has approximately 30-32 pack years of smoking history.  Today patient was ambulating in the hallway and on room air he desaturated to 85%.  Chest x-ray has revealed COPD with multiple bullae, no pneumonia.  Patient reports some cough Patient desperately wanted to go home   Past Medical History:  Diagnosis Date  . Bladder stone 12/2017  . Hypertension     Past Surgical History:  Procedure Laterality Date  . BLADDER REPAIR N/A 01/10/2018   Procedure: BLADDER REPAIR;  Surgeon: Hollice Espy, MD;  Location: [facility] ORS;  Service: Urology;  Laterality: N/A;  . CYSTOGRAM N/A 01/10/2018   Procedure: CYSTOGRAM;  Surgeon: Hollice Espy, MD;  Location: [facility] ORS;  Service: Urology;  Laterality: N/A;  . CYSTOSCOPY W/ RETROGRADES Bilateral 01/10/2018   Procedure: CYSTOSCOPY WITH RETROGRADE PYELOGRAM;  Surgeon: Hollice Espy, MD;  Location: [facility] ORS;  Service: Urology;  Laterality: Bilateral;  . CYSTOSCOPY WITH LITHOLAPAXY N/A 01/10/2018   Procedure: CYSTOSCOPY WITH open cystolithotomy;  Surgeon: Hollice Espy, MD;  Location: [facility] ORS;  Service: Urology;  Laterality: N/A;  . NO PAST SURGERIES    . none      Family History  Problem Relation Age of Onset  . Lung cancer Father     Social History:  reports that he has been smoking cigarettes.  he has  never used smokeless tobacco. He reports that he does not drink alcohol or use drugs.  Allergies:  Allergies  Allergen Reactions  . Penicillins Diarrhea, Nausea And Vomiting and Other (See Comments)    Has patient had a PCN reaction causing immediate rash, facial/tongue/throat swelling, SOB or lightheadedness with hypotension: Yes Has patient had a PCN reaction causing severe rash involving mucus membranes or skin necrosis: No Has patient had a PCN reaction that required hospitalization: No Has patient had a PCN reaction occurring within the last 10 years: No If all of the above answers are "NO", then may proceed with Cephalosporin use.     Medications: I have reviewed the patient's current medications.  Results for orders placed or performed during the hospital encounter of 01/10/18 (from the past 48 hour(s))  Basic metabolic panel     Status: Abnormal   Collection Time: 01/11/18  4:19 AM  Result Value Ref Range   Sodium 137 135 - 145 mmol/L   Potassium 4.5 3.5 - 5.1 mmol/L   Chloride 104 101 - 111 mmol/L   CO2 25 22 - 32 mmol/L   Glucose, Bld 95 65 - 99 mg/dL   BUN 16 6 - 20 mg/dL   Creatinine, Ser 0.81 0.61 - 1.24 mg/dL   Calcium 8.5 (L) 8.9 - 10.3 mg/dL   GFR calc non Af Amer >60 >60 mL/min   GFR calc Af Amer >60 >60 mL/min    Comment: (NOTE) The eGFR has been calculated using  the CKD EPI equation. This calculation has not been validated in all clinical situations. eGFR's persistently <60 mL/min signify possible Chronic Kidney Disease.    Anion gap 8 5 - 15    Comment: Performed at [facility], [place], [place] [address]  Creatinine, Body Fluid Other     Status: None   Collection Time: 01/11/18  8:15 AM  Result Value Ref Range   Creatinine, Body Fluid 0.9 mg/dL    Comment: (NOTE) ________________________________________________________ :  Peritoneal  :       Pleural          :   Synovial      : :______________:________________________:________________: :              : Transudate :  Exudate  :                : :_____________ :____________:___________:________________: :0.5-2.0 mg/dL : Not Estab. : Not Estab.:   Not Estab.   : :______________:____________:___________:_______________ : The method performance specifications have not been established for this test in body fluid. The test result should be integrated into the clinical context for interpretation. The reference intervals and other method performance specifications have not been established for this test. The test result should be integrated into the clinical context for interpretation. Performed At: [facility] [place], [place] [id number] Rush Farmer MD DV:[id number]    Source of Sample JP DRAINAGE     Comment: Performed at [facility], [address] [place], [place] [address]    Dg Chest 2 View  Result Date: 01/12/2018 CLINICAL DATA:  Decreased oxygen saturation EXAM: CHEST  2 VIEW COMPARISON:  Chest radiograph May 29, 2015 and chest CT January 22, 2016 FINDINGS: There is underlying COPD. The lungs are somewhat hyperexpanded. 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 There are scattered areas of scarring elsewhere in the lungs. There is evidence of bullous disease throughout much of the left lung, stable. There is chronic blunting of each costophrenic angle. There is no edema or consolidation. Heart size is normal. The pulmonary vascularity on the left is distorted by the cicatrization. Pulmonary vascular on the right appears unremarkable given the underlying COPD. There is aortic atherosclerosis. No adenopathy. No bone lesions. 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 There is no frank edema or consolidation. The heart size is normal. Pulmonary vascularity is stable. No adenopathy evident. There is aortic atherosclerosis. Aortic Atherosclerosis (ICD10-I70.0) and Emphysema (ICD10-J43.9). Electronically Signed   By: Lowella Grip III M.D.   On: 01/12/2018 11:49    ROS:  CONSTITUTIONAL: Denies fevers, chills. Denies any fatigue, weakness.  EYES: Denies blurry vision, double vision, eye pain. EARS, NOSE, THROAT: Denies tinnitus, ear pain, hearing loss. RESPIRATORY: Reports chronic shortness of breath with ambulation denies cough, wheeze CARDIOVASCULAR: Denies chest pain, palpitations, edema.  GASTROINTESTINAL: Denies nausea, vomiting, diarrhea, abdominal pain. Denies bright red blood per rectum. GENITOURINARY: Denies dysuria, hematuria. ENDOCRINE: Denies nocturia or thyroid problems. HEMATOLOGIC AND LYMPHATIC: Denies easy bruising or bleeding. SKIN: Denies rash or lesion. MUSCULOSKELETAL: Denies pain in neck, back, shoulder, knees, hips or arthritic symptoms.  NEUROLOGIC: Denies paralysis, paresthesias.  PSYCHIATRIC: Denies anxiety or depressive symptoms. Blood pressure 116/77, pulse (!) 135,  temperature 98.3 F (36.8 C), temperature source Oral, resp. rate 20, height _0  (1.6 m), weight 47.9 kg (105 lb 9.6 oz), SpO2 93 %.   PHYSICAL EXAMINATION:  GENERAL: currently in no acute distress while resting HEAD: Normocephalic, atraumatic.  EYES: Pupils equal, round, and reactive to light. Extraocular muscles intact. No scleral icterus.  MOUTH: Moist mucosal membranes. Dentition intact. No abscess noted. EARS, NOSE, THROAT: Clear without exudates. No external lesions.  NECK: Supple. No thyromegaly. No nodules. No JVD.  PULMONARY: Bronchial breath sounds to auscultation bilaterally without wheezes, rales, or rhonchi. No use of accessory muscles. Good respiratory effort. CHEST: Nontender to palpation.  CARDIOVASCULAR:  S1, S2, regular rate and rhythm. No murmurs, rubs, or gallops.  GASTROINTESTINAL: Soft, nontender, nondistended. No masses. Positive bowel sounds. No hepatosplenomegaly. MUSCULOSKELETAL: No swelling, clubbing, edema. Range of motion full in all extremities. NEUROLOGIC: Cranial nerves II-XII intact. No gross focal neurological deficits. Sensation intact. Reflexes intact. SKIN: No ulcerations, lesions, rash, cyanosis. Skin warm, dry. Turgor intact. PSYCHIATRIC: Mood, affect within normal limits. Patient awake, alert, oriented x 3. Insight and judgment intact.   Assessment/Plan:  #Acute on chronic hypoxic respiratory failure secondary to mild  bronchitis and COPD  Prednisone will be started and he will be discharged with prednisone tapering dose Proventil inhaler every 4 hours as needed  Spiriva and Advair inhalers Outpatient follow-up with pulmonology as recommended Ability pulse ox is 89-90% and patient's heart rate went up to 160s, patient's heart rate at baseline seems to be at around 110-120 as per my discussion with Dr. Erlene Quan   #Hypoxia with sinus tachycardia EKG has revealed biatrial enlargement and sinus tachycardia at 129 Will rule out pulmonary embolism a CT angiogram of the chest Will get echocardiogram Small dose of beta-blocker is added in view of his sinus tachycardia    #Hypertension blood pressure well controlled and patient is not on any home medications at this time   #Status post intraoperative bladder perforation Patient had bladder repair  discharge planning per  Dr. Erlene Quan  Generalized weakness Deconditioning with physical therapy  TOTAL TIME TAKING CARE OF THIS PATIENT:  41 minutes.   Note: This dictation was prepared with Dragon dictation along with smaller phrase technology. Any transcriptional errors that result from this process are unintentional.   _1 @ Pager - [phone] 01/12/2018, 3:13 PM

## 2018-01-12 NOTE — Progress Notes (Signed)
JP drain removed per MD order. 

## 2018-01-13 ENCOUNTER — Inpatient Hospital Stay (HOSPITAL_COMMUNITY)
Admission: RE | Admit: 2018-01-13 | Discharge: 2018-01-13 | Disposition: A | Discharge status: Home / Self Care | Payer: Medicare Other | Source: Ambulatory Visit | Attending: Internal Medicine | Admitting: Internal Medicine

## 2018-01-13 DIAGNOSIS — R0600 Dyspnea, unspecified: Secondary | ICD-10-CM

## 2018-01-13 LAB — BASIC METABOLIC PANEL
Anion gap: 9 (ref 5–15)
BUN: 20 mg/dL (ref 6–20)
CALCIUM: 9.1 mg/dL (ref 8.9–10.3)
CO2: 26 mmol/L (ref 22–32)
CREATININE: 0.71 mg/dL (ref 0.61–1.24)
Chloride: 106 mmol/L (ref 101–111)
GFR calc Af Amer: >60 mL/min (ref >60)
Glucose, Bld: 107 mg/dL — ABNORMAL HIGH (ref 65–99)
Potassium: 4 mmol/L (ref 3.5–5.1)
SODIUM: 141 mmol/L (ref 135–145)

## 2018-01-13 LAB — ECHOCARDIOGRAM COMPLETE
HEIGHTINCHES: 63 in
Weight: 1689.6054 oz

## 2018-01-13 LAB — CBC
HEMATOCRIT: 36.6 % — AB (ref 40.0–52.0)
Hemoglobin: 12.1 g/dL — ABNORMAL LOW (ref 13.0–18.0)
MCH: 28.8 pg (ref 26.0–34.0)
MCHC: 33.1 g/dL (ref 32.0–36.0)
MCV: 87 fL (ref 80.0–100.0)
PLATELETS: 258 10*3/uL (ref 150–440)
RBC: 4.21 MIL/uL — ABNORMAL LOW (ref 4.40–5.90)
RDW: 14.4 % (ref 11.5–14.5)
WBC: 9.8 10*3/uL (ref 3.8–10.6)

## 2018-01-13 MED ORDER — GUAIFENESIN ER 600 MG PO TB12: 600.0000 mg | ORAL_TABLET | Freq: Two times a day (BID) | ORAL | 0 refills | Status: AC

## 2018-01-13 MED ORDER — GUAIFENESIN ER 600 MG PO TB12
600.0000 mg | ORAL_TABLET | Freq: Two times a day (BID) | ORAL | Status: DC
  Administered 2018-01-13: 600 mg via ORAL
  Filled 2018-01-13: qty 1

## 2018-01-13 MED ORDER — PREDNISONE 20 MG PO TABS: 40.0000 mg | ORAL_TABLET | Freq: Every day | ORAL | 0 refills | Status: AC

## 2018-01-13 MED ORDER — METHYLPREDNISOLONE SODIUM SUCC 40 MG IJ SOLR
40.0000 mg | Freq: Two times a day (BID) | INTRAMUSCULAR | Status: DC
  Administered 2018-01-13: 40 mg via INTRAVENOUS
  Filled 2018-01-13: qty 1

## 2018-01-13 MED ORDER — ENSURE ENLIVE PO LIQD: 237.0000 mL | Freq: Two times a day (BID) | ORAL | Status: DC

## 2018-01-13 MED ORDER — FLUTICASONE-SALMETEROL 250-50 MCG/DOSE IN AEPB: 1.0000 | INHALATION_SPRAY | Freq: Two times a day (BID) | RESPIRATORY_TRACT | 2 refills | Status: AC

## 2018-01-13 MED ORDER — IPRATROPIUM-ALBUTEROL 0.5-2.5 (3) MG/3ML IN SOLN
3.0000 mL | Freq: Four times a day (QID) | RESPIRATORY_TRACT | Status: DC
  Administered 2018-01-13: 3 mL via RESPIRATORY_TRACT
  Filled 2018-01-13: qty 3

## 2018-01-13 MED ORDER — METOPROLOL TARTRATE 25 MG PO TABS
25.0000 mg | ORAL_TABLET | Freq: Two times a day (BID) | ORAL | Status: DC
Start: 2018-01-13 — End: 2018-01-13
  Administered 2018-01-13: 25 mg via ORAL
  Filled 2018-01-13: qty 1

## 2018-01-13 MED ORDER — TIOTROPIUM BROMIDE MONOHYDRATE 18 MCG IN CAPS: 18.0000 ug | ORAL_CAPSULE | Freq: Every day | RESPIRATORY_TRACT | Status: DC

## 2018-01-13 MED ORDER — MOMETASONE FURO-FORMOTEROL FUM 200-5 MCG/ACT IN AERO
2.0000 | INHALATION_SPRAY | Freq: Two times a day (BID) | RESPIRATORY_TRACT | Status: DC
  Administered 2018-01-13: 2 via RESPIRATORY_TRACT
  Filled 2018-01-13: qty 8.8

## 2018-01-13 MED ORDER — METOPROLOL TARTRATE 25 MG PO TABS: 25.0000 mg | ORAL_TABLET | Freq: Two times a day (BID) | ORAL | 2 refills | Status: AC

## 2018-01-13 NOTE — Progress Notes (Signed)
Pt ambulated around nurses station. Sats dropped to 88% on RA. Pt encouraged to walk at a slower pace. Pt encouraged to take IS home and use at home.  MD aware. MD prepared D/C paperwork. Pt and family are aware and in agreement.

## 2018-01-13 NOTE — Progress Notes (Signed)
*  PRELIMINARY RESULTS* Echocardiogram 2D Echocardiogram has been performed.  Cristela BlueHege, Shalla Bulluck 01/13/2018, 2:17 PM

## 2018-01-13 NOTE — Progress Notes (Signed)
[employer] [facility] [facility] [place] [facility]   PATIENT NAME: Jesse Douglas    MR#:  [id number]  DATE OF BIRTH:  01/29/1951  SUBJECTIVE:  CHIEF COMPLAINT:  No chief complaint on file.  - Patient admitted for cystoscopy with open cystolithotomy, complicated with bladder perforation and bladder repair. -Postoperatively was hypoxic and so medical consult requested. -This morning patient still requiring 2 L oxygen. Also very anxious because his wife was involved in a motor vehicle accident yesterday according to the daughter. -Maxcine HamSpeaks [place] but can understand English. Daughters at bedside translating  REVIEW OF SYSTEMS:  Review of Systems  Constitutional: Negative for chills, fever and malaise/fatigue.  HENT: Negative for congestion, ear discharge, hearing loss and nosebleeds.   Respiratory: Positive for shortness of breath. Negative for cough and wheezing.   Cardiovascular: Negative for chest pain and palpitations.  Gastrointestinal: Negative for abdominal pain, constipation, diarrhea, nausea and vomiting.  Genitourinary: Negative for dysuria.  Musculoskeletal: Negative for myalgias.  Neurological: Negative for dizziness, speech change, focal weakness, seizures and headaches.  Psychiatric/Behavioral: Negative for depression.    DRUG ALLERGIES:   Allergies  Allergen Reactions  . Penicillins Diarrhea, Nausea And Vomiting and Other (See Comments)    Has patient had a PCN reaction causing immediate rash, facial/tongue/throat swelling, SOB or lightheadedness with hypotension: Yes Has patient had a PCN reaction causing severe rash involving mucus membranes or skin necrosis: No Has patient had a PCN reaction that required hospitalization: No Has patient had a PCN reaction occurring within the last 10 years: No If all of the above answers are "NO", then may proceed with Cephalosporin use.     VITALS:  Blood pressure 130/70, pulse (!) 102, temperature 98 F (36.7 C),  temperature source Oral, resp. rate 20, height 5\' 3"  (1.6 m), weight 47.9 kg (105 lb 9.6 oz), SpO2 96 %.  PHYSICAL EXAMINATION:  Physical Exam  GENERAL:  67 y.o.-year-old patient lying in the bed with no acute distress.  EYES: Pupils equal, round, reactive to light and accommodation. No scleral icterus. Extraocular muscles intact.  HEENT: Head atraumatic, normocephalic. Oropharynx and nasopharynx clear.  NECK:  Supple, no jugular venous distention. No thyroid enlargement, no tenderness.  LUNGS: Sounds congested, but moving air bilaterally, scattered expiratory wheezes and scant breath sounds especially at the right base. no rales,rhonchi or crepitation. No use of accessory muscles of respiration.  CARDIOVASCULAR: S1, S2 normal. No murmurs, rubs, or gallops.  ABDOMEN: Soft, nontender, nondistended. Bowel sounds present. No organomegaly or mass.  EXTREMITIES: No pedal edema, cyanosis, or clubbing.  NEUROLOGIC: Cranial nerves II through XII are intact. Muscle strength 5/5 in all extremities. Sensation intact. Gait not checked.  PSYCHIATRIC: The patient is alert and oriented x 3.  SKIN: No obvious rash, lesion, or ulcer.    LABORATORY PANEL:   CBC No results for input(s): WBC, HGB, HCT, PLT in the last 168 hours. ------------------------------------------------------------------------------------------------------------------  Chemistries  Recent Labs  Lab 01/11/18 0419  NA 137  K 4.5  CL 104  CO2 25  GLUCOSE 95  BUN 16  CREATININE 0.81  CALCIUM 8.5*   ------------------------------------------------------------------------------------------------------------------  Cardiac Enzymes No results for input(s): TROPONINI in the last 168 hours. ------------------------------------------------------------------------------------------------------------------  RADIOLOGY:  Dg Chest 2 View  Result Date: 01/12/2018 CLINICAL DATA:  Decreased oxygen saturation EXAM: CHEST  2 VIEW  COMPARISON:  Chest radiograph May 29, 2015 and chest CT January 22, 2016 FINDINGS: There is underlying COPD. The lungs are somewhat hyperexpanded. 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 There are scattered areas of scarring elsewhere in the lungs. There is evidence of bullous disease throughout much of the left lung, stable. There is chronic blunting of each costophrenic angle. There is no edema or consolidation. Heart size is normal. The pulmonary vascularity on the left is distorted by the cicatrization. Pulmonary vascular on the right appears unremarkable given the underlying COPD. There is aortic atherosclerosis. No adenopathy. No bone lesions. 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 There is no frank edema or consolidation. The heart size is normal. Pulmonary vascularity is stable. No adenopathy evident. There is aortic atherosclerosis. Aortic Atherosclerosis (ICD10-I70.0) and Emphysema (ICD10-J43.9). Electronically Signed   By: Bretta Bang III M.D.   On: 01/12/2018 11:49   Ct Angio Chest Pe W Or Wo Contrast  Result Date: 01/12/2018 CLINICAL DATA:  Hypoxia following bladder surgery EXAM: CT ANGIOGRAPHY CHEST WITH CONTRAST TECHNIQUE: Multidetector CT imaging of the chest was performed using the standard protocol during bolus administration of intravenous contrast. Multiplanar CT image reconstructions and MIPs were obtained to evaluate the vascular anatomy. CONTRAST:  75mL ISOVUE-370 IOPAMIDOL (ISOVUE-370) INJECTION 76% COMPARISON:  CT abdomen 12/19/2017 FINDINGS: Cardiovascular: No filling defect within the RIGHT upper lobe are RIGHT lower lobe to suggest acute pulmonary embolism. There is truncation of the left upper lobe pulmonary arteries  suggesting prior surgery or severe bullous change. No filling defect within the LEFT lower lobe pulmonary arteries. The lingular artery not identified. No acute findings aorta. Mediastinum/Nodes: No axillary supraclavicular adenopathy. No mediastinal hilar adenopathy. No pericardial effusion. Esophagus normal. Lungs/Pleura: Small gas collection in the posterior aspect of the superior LEFT hemithorax which in improved greatly from CT of 06/04/2015. Probable empyema in LEFT upper lobe at that time (2016). Severe centrilobular emphysema the upper lobes with bullous change. Centrilobular emphysema in the lower lobes. The RIGHT lung is hyperexpanded with moderate to severe centrilobular emphysema No pneumothorax. Upper Abdomen: Small amount of intraperitoneal free air related to recent surgery. No additional acute findings Musculoskeletal: No aggressive osseous lesion. Review of the MIP images confirms the above findings. IMPRESSION: 1. No evidence acute pulmonary embolism. 2. Chronic pleural fluid collection in the superior LEFT hemithorax. 3. Severe emphysema in the LEFT lung with bullous change in the upper and lower lobes. Volume loss in LEFT hemithorax. 4. Moderate severe emphysema of the RIGHT lung. Emphysema (ICD10-J43.9). Electronically Signed   By: Genevive Bi M.D.   On: 01/12/2018 17:33    EKG:   Orders placed or performed during the hospital encounter of 01/10/18  . EKG 12-Lead  . EKG 12-Lead  . EKG 12-Lead  . EKG 12-Lead    ASSESSMENT AND PLAN:   67 year old male with no significant past medical history admitted for bladder stone and had cystoscopy with open cystolithotomy and bladder repair done for bladder perforation intraoperatively, medical consult requested on postop day 2 for persistent hypoxia.  1. Persistent hypoxia-CT of the chest negative for pulmonary embolism but patient has significant COPD and emphysema. History of smoking -Encouraged to do the incentive spirometer-right  technique taught -Started on inhalers with [facility] and will benefit from discharge on the same. Continue duo nebs while in the hospital. -Wean O2 as tolerated. Resting sats have  Improved. -Also started on Solu-Medrol IV  was here, can be discharged on prednisone 40 mg for 5 days -At home prior to admission also patient was taking resting causes due to dyspnea with minimal exertion. Sats 88 and above are good for this patient considering his COPD  2. Sinus tachycardia-given secondary to his hypoxia and respiratory distress. -Continue metoprolol for now. -Check potassium and magnesium levels  3. Bladder stone-status post cystoscopy and cysto lithotomy done on 01/10/2018. Postoperative day 3 today. -Also had intraoperative bladder perforation with repair done. -Will be discharged with the Foley catheter for 10 days. -Outpatient follow-up with urology as recommended. Mild hematuria noted  4. BPH-continue home medications  5. DVT prophylaxis-currently on subcutaneous heparin   All the records are reviewed and case discussed with Care Management/Social Workerr. Management plans discussed with the patient, family and they are in agreement.  CODE STATUS: Full code  TOTAL TIME TAKING CARE OF THIS PATIENT: 38 minutes.   POSSIBLE D/C  today or tomorrow  , DEPENDING ON CLINICAL CONDITION.   Enid Baas M.D on 01/13/2018 at 9:58 AM  Between 7am to 6pm - Pager - [phone]  After 6pm go to [email or web address] - password [email or web address]  [facility]  Office  [phone]  CC: Primary care physician; Yevonne Pax, MD

## 2018-01-13 NOTE — Progress Notes (Signed)
IV was removed. Discharge instructions, follow-up appointments, and prescriptions were provided to the pt and daughters at bedside. All questions answered. Family and pt were shown how to empty drainage bag and foley bag was switched over to a leg bag. An extra Foley bag was given to pt. The pt was taken downstairs via wheelchair by wheelchair.

## 2018-01-13 NOTE — Progress Notes (Signed)
SATURATION QUALIFICATIONS: (This note is used to comply with regulatory documentation for home oxygen)  Patient Saturations on Room Air at Rest = 96%  Patient Saturations on Room Air while Ambulating = 83%  Patient Saturations on 2 Liters of oxygen while Ambulating = %

## 2018-01-13 NOTE — Progress Notes (Signed)
Initial Nutrition Assessment  DOCUMENTATION CODES:   Severe malnutrition in context of chronic illness  INTERVENTION:  Ensure Enlive po BID, each supplement provides 350 kcal and 20 grams of protein  MVI w/ minerals  NUTRITION DIAGNOSIS:   Severe Malnutrition related to chronic illness as evidenced by severe fat depletion, severe muscle depletion  GOAL:   Patient will meet greater than or equal to 90% of their needs  MONITOR:   PO intake, I & O's, Labs, Supplement acceptance, Weight trends  REASON FOR ASSESSMENT:   Malnutrition Screening Tool    ASSESSMENT:   67 year old male with no significant past medical history admitted for bladder stone and had cystoscopy with open cystolithotomy and bladder repair done for bladder perforation intraoperatively, medical consult requested on postop day 2 for persistent hypoxia.  Spoke with Jesse Douglas via his daughters at bedside. [name] year ago to begin living with his daughters and take care of their children. Since then patient has been eating mostly fast food two times a day like pizza. Daughter reports UBW of 93-95 pounds with weight loss to 80 pounds over the past year, a 13-15% insignificant weight loss. PO intake has been good, 75-85% documented. Was eating homemade food today, soup, rice, and vegetables. Will drink ensure as well.  Labs reviewed Medications reviewed and include:  Prednisone, Colace   NUTRITION - FOCUSED PHYSICAL EXAM:    Most Recent Value  Orbital Region  Moderate depletion  Upper Arm Region  Severe depletion  Thoracic and Lumbar Region  Severe depletion  Buccal Region  Severe depletion  Temple Region  Severe depletion  Clavicle Bone Region  Severe depletion  Clavicle and Acromion Bone Region  Severe depletion  Scapular Bone Region  Severe depletion  Dorsal Hand  Severe depletion  Patellar Region  Severe depletion  Anterior Thigh Region  Severe depletion  Posterior Calf  Region  Severe depletion  Edema (RD Assessment)  None  Hair  Reviewed  Eyes  Reviewed  Mouth  Reviewed  Skin  Reviewed  Nails  Reviewed       Diet Order:  Diet regular Room service appropriate? Yes; Fluid consistency: Thin  EDUCATION NEEDS:   Education needs have been addressed  Skin:  Skin Assessment: Skin Integrity Issues: Skin Integrity Issues:: Incisions Incisions: to mid lower abdomen  Last BM:  01/09/2018  Height:   Ht Readings from Last 1 Encounters:  01/10/18 5\' 3"  (1.6 m)    Weight:   Wt Readings from Last 1 Encounters:  01/10/18 105 lb 9.6 oz (47.9 kg)    Ideal Body Weight:  52.27 kg  BMI:  Body mass index is 18.71 kg/m.  Estimated Nutritional Needs:   Kcal:  1300-1600 calories  Protein:  71-81 (1.5-1.7g/kg)  Fluid:  >1.5L  Dionne AnoWilliam M. Reiley Keisler, MS, RD LDN Inpatient Clinical Dietitian Pager [phone]

## 2018-01-13 NOTE — Evaluation (Signed)
Physical Therapy Evaluation Patient Details Name: Jesse PenmanBinh V Douglas MRN: [id number] DOB: 12/24/1950 Today's Date: 01/13/2018   History of Present Illness  67 y/o male who was here for kidney stone surgery, ultimately ended up being admitted with respiratory failure.  Clinical Impression  Pt at or near baseline regarding mobility, ambulation, etc.  He did have considerable fatigue with 70 ft of ambulation/6 steps with HR in the 130s and O2 dropping to mid/low 80s on room air.  Pt will be safe to go home regarding mobility, balance, etc but his biggest concern is activity tolerance.     Follow Up Recommendations No PT follow up    Equipment Recommendations       Recommendations for Other Services       Precautions / Restrictions Precautions Precautions: Fall Restrictions Weight Bearing Restrictions: No      Mobility  Bed Mobility Overal bed mobility: Independent             General bed mobility comments: Pt able to get up to EOB w/o assist  Transfers Overall transfer level: Independent Equipment used: None             General transfer comment: Pt able to rise w/o hesitation or safety concerns; good confidence/balance  Ambulation/Gait Ambulation/Gait assistance: Modified independent (Device/Increase time) Ambulation Distance (Feet): 70 Feet Assistive device: None       General Gait Details: Pt was able to ambulate with good confidence and no LOBs, but became fatigued very quickly.  His O2 dropped to 83% and HR jumped to the 130s.  Both numbers stayed off for ~2 minutes after sitting back down before begining to normalize  Stairs Stairs: Yes Stairs assistance: Modified independent (Device/Increase time) Stair Management: One rail Left Number of Stairs: 6 General stair comments: Pt easily negotiated steps safely  Wheelchair Mobility    Modified Rankin (Stroke Patients Only)       Balance Overall balance assessment: Independent                                           Pertinent Vitals/Pain Pain Assessment: No/denies pain    Home Living Family/patient expects to be discharged to:: Private residence Living Arrangements: Spouse/significant other Available Help at Discharge: Family   Home Access: Stairs to enter Entrance Stairs-Rails: Left Entrance Stairs-Number of Steps: 4          Prior Function Level of Independence: Independent         Comments: Pt is not out of the home much, but able to do all he needs w/o AD.  Wife runs errands, groceries, etc     Hand Dominance        Extremity/Trunk Assessment   Upper Extremity Assessment Upper Extremity Assessment: Generalized weakness(b/l shoulder elevation <90, grossly 3-/5 t/o)    Lower Extremity Assessment Lower Extremity Assessment: Generalized weakness;Overall [facility] for tasks assessed(grossly 3+/5)       Communication   Communication: Prefers language other than English(knows some English, English speaking daughters present)  Cognition Arousal/Alertness: Awake/alert Behavior During Therapy: WFL for tasks assessed/performed Overall Cognitive Status: Within Functional Limits for tasks assessed                                        General Comments      Exercises  Assessment/Plan    PT Assessment Patent does not need any further PT services  PT Problem List         PT Treatment Interventions      PT Goals (Current goals can be found in the Care Plan section)  Acute Rehab PT Goals Patient Stated Goal: go home PT Goal Formulation: All assessment and education complete, DC therapy    Frequency     Barriers to discharge        Co-evaluation               AM-PAC PT "6 Clicks" Daily Activity  Outcome Measure Difficulty turning over in bed (including adjusting bedclothes, sheets and blankets)?: None Difficulty moving from lying on back to sitting on the side of the bed? : None Difficulty sitting down on and  standing up from a chair with arms (e.g., wheelchair, bedside commode, etc,.)?: None Help needed moving to and from a bed to chair (including a wheelchair)?: None Help needed walking in hospital room?: None Help needed climbing 3-5 steps with a railing? : None 6 Click Score: 24    End of Session Equipment Utilized During Treatment: Gait belt Activity Tolerance: Patient limited by fatigue Patient left: with bed alarm set;with call bell/phone within reach;with family/visitor present Nurse Communication: Mobility status PT Visit Diagnosis: Muscle weakness (generalized) (M62.81)    Time: 1610-9604 PT Time Calculation (min) (ACUTE ONLY): 18 min   Charges:   PT Evaluation $PT Eval Low Complexity: 1 Low     PT G Codes:        Malachi Pro, DPT 01/13/2018, 9:54 AM

## 2018-01-17 NOTE — Progress Notes (Signed)
[name] Consultation      Assessment and Plan:  Severe emphysema. -Discussed with patient and son, the patient has very severe anatomical emphysema. -Despite this his respiratory status appears quite functional given the severity of his emphysema. - We discussed that it currently does not require oxygen on ambulation.  We discussed that the best things he can do for his health are continued smoking cessation, continue current inhalers, and try to maintain his functional activity status. -Patient's son tells me that they are going to relocate his father to where he lives in [place] in the near future, therefore will defer further testing such as pulmonary function testing or overnight oximetry. -We will check an alpha-1 screening test today.  Prevnar 13 was administered today. -Given the severity of his emphysema would not refer to lung cancer screening.   Chronic respiratory failure. -Continue physical activity. -I asked the patient to follow-up in 6 months if he is still in the area, at that time we would consider referral to pulmonary rehab, and check overnight oximetry.  Meds ordered this encounter  Medications  . pneumococcal 13-valent conjugate vaccine (PREVNAR 13) injection 0.5 mL   Return in about 6 months (around 07/18/2018).    Date: 01/18/2018  MRN# [id number] Jesse Douglas 1951-02-24  Referring Douglas: Hospitalist Douglas.   Jesse Douglas is a 67 y.o. old male seen in consultation for chief complaint of:    Chief Complaint  Patient presents with  . Hospitalization Follow-up  . COPD    HPI:   The patient is 67 year old primarily [place] speaking male.  He was admitted to the hospital on 01/10/18 for TURP/cystolitholapaxy which was complicated by a bladder perforation requiring open repair with open cystolithotomy.  He was admitted to the hospital, and postoperatively he was found to be hypoxic.  Medicine consultation was obtained with internal  medicine, a CT angios performed and he was found to have severe emphysema.  He was discharged with outpatient pulmonary follow-up.  He was not discharged on oxygen, he smoked for 60 years, last smoked 1 ppd 1 year ago. He feels that his breathing is ok but when he goes to work he gets tired, he works at an [employer]. He does not get winded when getting dressed, or bathing. His son is present and provides most of the history, as well has the translator.  He gets winded on walking more that 5 minutes.  He takes albuterol twice per day,   Advair MDI 2 puffs twice per day.  They report no weight loss, cough, hemoptysis, chest pain.  Images personally reviewed, CT chest 01/12/18; severe bullous emphysema, slight volume loss with pleural thickening/scarring in the left upper lobe, emphysematous changes are very severe in the left lung, extending all the way to the left base in fact there appears to be very little left lung parenchyma remaining.   **Desat walk at rest on RA, sat 98% and HR 88, after 180, sat was 94% and HR 88.   PMHX:   Past Medical History:  Diagnosis Date  . Bladder stone 12/2017  . Hypertension    Surgical Hx:  Past Surgical History:  Procedure Laterality Date  . BLADDER REPAIR N/A 01/10/2018   Procedure: BLADDER REPAIR;  Surgeon: Jesse Scotland, MD;  Location: [facility];  Service: Urology;  Laterality: N/A;  . CYSTOGRAM N/A 01/10/2018   Procedure: CYSTOGRAM;  Surgeon: Jesse Scotland, MD;  Location: [facility];  Service: Urology;  Laterality: N/A;  . CYSTOSCOPY  W/ RETROGRADES Bilateral 01/10/2018   Procedure: CYSTOSCOPY WITH RETROGRADE PYELOGRAM;  Surgeon: Jesse ScotlandBrandon, Ashley, MD;  Location: [facility];  Service: Urology;  Laterality: Bilateral;  . CYSTOSCOPY WITH LITHOLAPAXY N/A 01/10/2018   Procedure: CYSTOSCOPY WITH open cystolithotomy;  Surgeon: Jesse ScotlandBrandon, Ashley, MD;  Location: [facility];  Service: Urology;  Laterality: N/A;  . NO PAST SURGERIES    . none     Family Hx:    Family History  Problem Relation Age of Onset  . Lung cancer Father    Social Hx:   Social History   Tobacco Use  . Smoking status: Current Every Day Smoker    Types: Cigarettes    Last attempt to quit: 04/17/2015    Years since quitting: 2.7  . Smokeless tobacco: Never Used  Substance Use Topics  . Alcohol use: No  . Drug use: No   Medication:    Current Outpatient Medications:  .  albuterol (PROVENTIL HFA;VENTOLIN HFA) 108 (90 Base) MCG/ACT inhaler, Inhale 2 puffs into the lungs every 6 (six) hours as needed for wheezing or shortness of breath., Disp: 1 Inhaler, Rfl: 1 .  docusate sodium (COLACE) 100 MG capsule, Take 1 capsule (100 mg total) by mouth 2 (two) times daily., Disp: 60 capsule, Rfl: 0 .  Fluticasone-Salmeterol (ADVAIR DISKUS) 250-50 MCG/DOSE AEPB, Inhale 1 puff into the lungs 2 (two) times daily., Disp: 60 each, Rfl: 2 .  guaiFENesin (MUCINEX) 600 MG 12 hr tablet, Take 1 tablet (600 mg total) by mouth 2 (two) times daily., Disp: 20 tablet, Rfl: 0 .  HYDROcodone-acetaminophen (NORCO/VICODIN) 5-325 MG tablet, Take 1-2 tablets by mouth every 6 (six) hours as needed for moderate pain., Disp: 10 tablet, Rfl: 0 .  ibuprofen (ADVIL,MOTRIN) 200 MG tablet, Take 400 mg by mouth every 6 (six) hours as needed for headache or moderate pain., Disp: , Rfl:  .  oxybutynin (DITROPAN) 5 MG tablet, Take 1 tablet (5 mg total) by mouth every 8 (eight) hours as needed for bladder spasms., Disp: 30 tablet, Rfl: 0 .  predniSONE (DELTASONE) 20 MG tablet, Take 2 tablets (40 mg total) by mouth daily with breakfast for 5 days. X 5 days and stop, Disp: 10 tablet, Rfl: 0 .  tamsulosin (FLOMAX) 0.4 MG CAPS capsule, Take 1 capsule (0.4 mg total) by mouth daily., Disp: 30 capsule, Rfl: 11 .  tiotropium (SPIRIVA HANDIHALER) 18 MCG inhalation capsule, Place 1 capsule (18 mcg total) into inhaler and inhale daily., Disp: 30 capsule, Rfl: 1 .  metoprolol tartrate (LOPRESSOR) 25 MG tablet, Take 1 tablet (25 mg  total) by mouth 2 (two) times daily., Disp: 60 tablet, Rfl: 2   Allergies:  Penicillins  Review of Systems: Gen:  Denies  fever, sweats, chills HEENT: Denies blurred vision, double vision. bleeds, sore throat Cvc:  No dizziness, chest pain. Resp:   Denies cough or sputum production, shortness of breath Gi: Denies swallowing difficulty, stomach pain. Gu:  Denies bladder incontinence, burning urine Ext:   No Joint pain, stiffness. Skin: No skin rash,  hives  Endoc:  No polyuria, polydipsia. Psych: No depression, insomnia. Other:  All other systems were reviewed with the patient and were negative other that what is mentioned in the HPI.   Physical Examination:   VS: BP 110/78 (BP Location: Left Arm, Cuff Size: Normal)   Pulse 83   Resp 16   Ht 5\' 3"  (1.6 m)   Wt 105 lb (47.6 kg) Comment: per fam  SpO2 97%   BMI 18.60 kg/m  General Appearance: No distress  Neuro:without focal findings,  speech normal,  HEENT: PERRLA, EOM intact.   Pulmonary: normal breath sounds, No wheezing.  CardiovascularNormal S1,S2.  No m/r/g.   Abdomen: Benign, Soft, non-tender. Renal:  No costovertebral tenderness  GU:  No performed at this time. Endoc: No evident thyromegaly, no signs of acromegaly. Skin:   warm, no rashes, no ecchymosis  Extremities: normal, no cyanosis, clubbing.  Other findings:    LABORATORY PANEL:   CBC Recent Labs  Lab 01/13/18 1037  WBC 9.8  HGB 12.1*  HCT 36.6*  PLT 258   ------------------------------------------------------------------------------------------------------------------  Chemistries  Recent Labs  Lab 01/13/18 1037  NA 141  K 4.0  CL 106  CO2 26  GLUCOSE 107*  BUN 20  CREATININE 0.71  CALCIUM 9.1   ------------------------------------------------------------------------------------------------------------------  Cardiac Enzymes No results for input(s): TROPONINI in the last 168  hours. ------------------------------------------------------------  RADIOLOGY:  No results found.     Thank  you for the consultation and for allowing [facility] to assist in the care of your patient. Our recommendations are noted above.  Please contact us if we can be of further service.   Wells Guiles, MD.  Board Certified in Internal Medicine, Pulmonary Medicine, Critical Care Medicine, and Sleep Medicine.  [facility] Office Number: [phone]  Santiago Glad, M.D.  Billy Fischer, M.D  01/18/2018

## 2018-01-18 ENCOUNTER — Encounter: Payer: Self-pay | Admitting: Internal Medicine

## 2018-01-18 ENCOUNTER — Ambulatory Visit (INDEPENDENT_AMBULATORY_CARE_PROVIDER_SITE_OTHER): Payer: Medicare Other | Admitting: Internal Medicine

## 2018-01-18 VITALS — BP 110/78 | HR 83 | Resp 16 | Ht 63.0 in | Wt 80.0 lb

## 2018-01-18 DIAGNOSIS — Z23 Encounter for immunization: Secondary | ICD-10-CM | POA: Diagnosis not present

## 2018-01-18 MED ORDER — PNEUMOCOCCAL 13-VAL CONJ VACC IM SUSP: 0.5000 mL | Freq: Once | INTRAMUSCULAR | Status: AC

## 2018-01-18 NOTE — Patient Instructions (Addendum)
Should get a yearly flu shot.   Should give prevnar vaccine-we will administer today. Will check alpha-1 antitrypsin deficiency, we will call you if this is abnormal.  Come back and see us in 6 months if you are still in the area. - Should try to keep up your physical activity, walk 20 minutes at least 3 days/week.

## 2018-01-19 ENCOUNTER — Encounter: Payer: Self-pay | Admitting: Urology

## 2018-01-19 ENCOUNTER — Ambulatory Visit
Admission: RE | Admit: 2018-01-19 | Discharge: 2018-01-19 | Disposition: A | Discharge status: Home / Self Care | Payer: Medicare Other | Source: Ambulatory Visit | Attending: Urology | Admitting: Urology

## 2018-01-19 ENCOUNTER — Ambulatory Visit (INDEPENDENT_AMBULATORY_CARE_PROVIDER_SITE_OTHER): Payer: Medicare Other | Admitting: Urology

## 2018-01-19 VITALS — BP 131/85 | HR 74 | Ht 63.0 in | Wt 80.0 lb

## 2018-01-19 DIAGNOSIS — R972 Elevated prostate specific antigen [PSA]: Secondary | ICD-10-CM

## 2018-01-19 DIAGNOSIS — N9972 Accidental puncture and laceration of a genitourinary system organ or structure during other procedure: Secondary | ICD-10-CM | POA: Diagnosis not present

## 2018-01-19 MED ORDER — IOTHALAMATE MEGLUMINE 17.2 % UR SOLN
250.0000 mL | Freq: Once | URETHRAL | Status: AC | PRN
  Administered 2018-01-19: 250 mL via INTRAVESICAL

## 2018-01-19 NOTE — Progress Notes (Signed)
01/19/2018 1:15 PM   Jerri Hargadon ZOX 27-Feb-1951 [id number]  Referring provider: Yevonne Pax, MD [address] [place], [place] [address]  Chief Complaint  Patient presents with  . Results    HPI: 67 year old male who returns today for catheter removal following cystogram.  He was taken to the operating room last week for planned cystolitholapaxy and possible TURP.  In order to accommodate the scope, urethral dilation was in bladder perforation requiring open repair and open cystolithotomy.  His hospital course was prolonged due to acute on chronic respiratory failure.  Since discharge, he has been tolerating the catheter fine.  Cystogram today when filled with 350 cc of contrast medium shows no evidence of leak.  He has followed up with pulmonology for his underlying lung disease and been started on inhalers for this chronic issue.  Overall, he is feeling well.  Incision seems to be healing without any difficulties.  He is accompanied today by his son who is from [place].  He would like to take him back to [place] for the time being with the ultimate goal of transition to [place] permanently.   PMH: Past Medical History:  Diagnosis Date  . Bladder stone 12/2017  . Hypertension     Surgical History: Past Surgical History:  Procedure Laterality Date  . BLADDER REPAIR N/A 01/10/2018   Procedure: BLADDER REPAIR;  Surgeon: Vanna Scotland, MD;  Location: [facility];  Service: Urology;  Laterality: N/A;  . CYSTOGRAM N/A 01/10/2018   Procedure: CYSTOGRAM;  Surgeon: Vanna Scotland, MD;  Location: [facility];  Service: Urology;  Laterality: N/A;  . CYSTOSCOPY W/ RETROGRADES Bilateral 01/10/2018   Procedure: CYSTOSCOPY WITH RETROGRADE PYELOGRAM;  Surgeon: Vanna Scotland, MD;  Location: [facility];  Service: Urology;  Laterality: Bilateral;  . CYSTOSCOPY WITH LITHOLAPAXY N/A 01/10/2018   Procedure: CYSTOSCOPY WITH open cystolithotomy;  Surgeon: Vanna Scotland, MD;  Location: [facility];   Service: Urology;  Laterality: N/A;  . NO PAST SURGERIES    . none      Home Medications:  Allergies as of 01/19/2018      Reactions   Penicillins Diarrhea, Nausea And Vomiting, Other (See Comments)   Has patient had a PCN reaction causing immediate rash, facial/tongue/throat swelling, SOB or lightheadedness with hypotension: Yes Has patient had a PCN reaction causing severe rash involving mucus membranes or skin necrosis: No Has patient had a PCN reaction that required hospitalization: No Has patient had a PCN reaction occurring within the last 10 years: No If all of the above answers are "NO", then may proceed with Cephalosporin use.      Medication List        Accurate as of 01/19/18 11:59 PM. Always use your most recent med list.          albuterol 108 (90 Base) MCG/ACT inhaler Commonly known as:  PROVENTIL HFA;VENTOLIN HFA Inhale 2 puffs into the lungs every 6 (six) hours as needed for wheezing or shortness of breath.   docusate sodium 100 MG capsule Commonly known as:  COLACE Take 1 capsule (100 mg total) by mouth 2 (two) times daily.   Fluticasone-Salmeterol 250-50 MCG/DOSE Aepb Commonly known as:  ADVAIR DISKUS Inhale 1 puff into the lungs 2 (two) times daily.   guaiFENesin 600 MG 12 hr tablet Commonly known as:  MUCINEX Take 1 tablet (600 mg total) by mouth 2 (two) times daily.   HYDROcodone-acetaminophen 5-325 MG tablet Commonly known as:  NORCO/VICODIN Take 1-2 tablets by mouth every 6 (six) hours  as needed for moderate pain.   ibuprofen 200 MG tablet Commonly known as:  ADVIL,MOTRIN Take 400 mg by mouth every 6 (six) hours as needed for headache or moderate pain.   metoprolol tartrate 25 MG tablet Commonly known as:  LOPRESSOR Take 1 tablet (25 mg total) by mouth 2 (two) times daily.   oxybutynin 5 MG tablet Commonly known as:  DITROPAN Take 1 tablet (5 mg total) by mouth every 8 (eight) hours as needed for bladder spasms.   tamsulosin 0.4 MG Caps  capsule Commonly known as:  FLOMAX Take 1 capsule (0.4 mg total) by mouth daily.   tiotropium 18 MCG inhalation capsule Commonly known as:  SPIRIVA HANDIHALER Place 1 capsule (18 mcg total) into inhaler and inhale daily.       Allergies:  Allergies  Allergen Reactions  . Penicillins Diarrhea, Nausea And Vomiting and Other (See Comments)    Has patient had a PCN reaction causing immediate rash, facial/tongue/throat swelling, SOB or lightheadedness with hypotension: Yes Has patient had a PCN reaction causing severe rash involving mucus membranes or skin necrosis: No Has patient had a PCN reaction that required hospitalization: No Has patient had a PCN reaction occurring within the last 10 years: No If all of the above answers are "NO", then may proceed with Cephalosporin use.     Family History: Family History  Problem Relation Age of Onset  . Lung cancer Father     Social History:  reports that he has been smoking cigarettes.  he has never used smokeless tobacco. He reports that he does not drink alcohol or use drugs.  ROS: UROLOGY Frequent Urination?: Yes Hard to postpone urination?: Yes Burning/pain with urination?: Yes Get up at night to urinate?: Yes Leakage of urine?: Yes Urine stream starts and stops?: No Trouble starting stream?: No Do you have to strain to urinate?: Yes Blood in urine?: Yes Urinary tract infection?: No Sexually transmitted disease?: No Injury to kidneys or bladder?: No Painful intercourse?: No Weak stream?: No Erection problems?: No Penile pain?: No  Gastrointestinal Nausea?: No Vomiting?: No Indigestion/heartburn?: No Diarrhea?: No Constipation?: No  Constitutional Fever: No Night sweats?: No Weight loss?: No Fatigue?: No  Skin Skin rash/lesions?: No Itching?: No  Eyes Blurred vision?: No Double vision?: No  Ears/Nose/Throat Sore throat?: No Sinus problems?: No  Hematologic/Lymphatic Swollen glands?: No Easy  bruising?: No  Cardiovascular Leg swelling?: No Chest pain?: No  Respiratory Cough?: Yes Shortness of breath?: Yes  Endocrine Excessive thirst?: No  Musculoskeletal Back pain?: No Joint pain?: No  Neurological Headaches?: No Dizziness?: No  Psychologic Depression?: No Anxiety?: No  Physical Exam: BP 131/85   Pulse 74   Ht 5\' 3"  (1.6 m)   Wt 80 lb (36.3 kg)   BMI 14.17 kg/m   Constitutional:  Alert and oriented, No acute distress.  Accompanied today by [place] translator and son.  Thin, cachectic appearing. HEENT: [place] AT, moist mucus membranes.  Trachea midline, no masses. Cardiovascular: No clubbing, cyanosis, or edema. Respiratory: Normal respiratory effort, no increased work of breathing. GI: Abdomen is soft, nontender, nondistended, no abdominal masses.  Lower midline abdominal incision healing well.  Drain site clean dry and intact. GU: Foley catheter removed Skin: No rashes, bruises or suspicious lesions. Neurologic: Grossly intact, no focal deficits, moving all 4 extremities. Psychiatric: Normal mood and affect.  Laboratory Data: Lab Results  Component Value Date   WBC 9.8 01/13/2018   HGB 12.1 (L) 01/13/2018   HCT 36.6 (L) 01/13/2018  MCV 87.0 01/13/2018   PLT 258 01/13/2018    Lab Results  Component Value Date   CREATININE 0.71 01/13/2018    Lab Results  Component Value Date   PSA1 4.4 (H) 12/30/2017   Urinalysis Lab Results  Component Value Date   SPECGRAV 1.015 12/30/2017   PHUR 7.5 12/30/2017   COLORU Yellow 12/30/2017   APPEARANCEUR Clear 12/30/2017   LEUKOCYTESUR Trace (A) 12/30/2017   PROTEINUR Negative 12/30/2017   GLUCOSEU Negative 12/30/2017   KETONESU Negative 12/30/2017   RBCU 2+ (A) 12/30/2017   BILIRUBINUR Negative 12/30/2017   UUROB 0.2 12/30/2017   NITRITE Negative 12/30/2017    Lab Results  Component Value Date   LABMICR See below: 12/30/2017   WBCUA None seen 12/30/2017   RBCUA 11-30 (A) 12/30/2017    LABEPIT None seen 12/30/2017   BACTERIA None seen 12/30/2017    Pertinent Imaging: CLINICAL DATA:  Bladder surgery last week status post repair. Re-evaluate bladder for persistent perforation.  EXAM: CYSTOGRAM  TECHNIQUE: Bladder was filled with 350 mL Cysto-Conray 2 by drip infusion. Serial spot images were obtained during bladder filling and post draining.  FLUOROSCOPY TIME:  Fluoroscopy Time:  1.3 minute  Radiation Exposure Index (if provided by the fluoroscopic device): 26.5 mGy  Number of Acquired Spot Images: 0  COMPARISON:  None.  FINDINGS: Initial scout image demonstrates no focal abnormality. The overlying bowel gas pattern is normal. The regional skeleton is unremarkable.  A pre-existing Foley catheter was present in the bladder and 350 ml of Cysto-Conray 2 was instilled via gravity. The bladder is normal in capacity. Trabeculations of the bladder as can be seen with bladder outlet obstruction. No vesicoureteral reflux is identified. There are no filling defects within the bladder. There is no extraluminal contrast. There is no post void residual on post drainage images.  IMPRESSION: 1. No evidence of persistent bladder perforation.   Electronically Signed   By: Elige Ko   On: 01/19/2018 10:29  Cystogram reviewed personally today.  No evidence of leak.  Assessment & Plan:    1. Bladder perforation, intraoperative Bladder healing well without evidence of ongoing leak-Foley removed today Bladder stone addressing the time of surgery Return in 6 weeks to reassess voiding symptoms Lifting precautions reviewed  2. Elevated PSA PSA mildly elevated to 4.4 in the setting of large bladder stone No previous PSAs for comparison Prostate is enlarged and may be appropriate for density Rectal exam unremarkable other than prostamegaly Will defer a prostate biopsy at this point in time, recheck after his urinary symptoms have improved Plan for  repeat PSA in the near future   Return in about 6 weeks (around 03/02/2018), or PVR, IPSS.  Vanna Scotland, MD  [facility] [address], [address] [place], [place] [address] [phone]

## 2018-01-23 DIAGNOSIS — Z23 Encounter for immunization: Secondary | ICD-10-CM | POA: Diagnosis not present

## 2018-01-23 NOTE — Addendum Note (Signed)
Addended by: Janean SarkSNIPES, Tashunda Vandezande K on: 01/23/2018 04:53 PM   Modules accepted: Orders

## 2018-01-23 NOTE — Addendum Note (Signed)
Addended by: Janean SarkSNIPES, Valoria Tamburri K on: 01/23/2018 04:43 PM   Modules accepted: Orders

## 2018-01-26 ENCOUNTER — Telehealth: Payer: Self-pay | Admitting: *Deleted

## 2018-01-26 NOTE — Telephone Encounter (Signed)
Chart entered for immunization update.

## 2018-02-16 ENCOUNTER — Ambulatory Visit: Payer: Medicare Other | Admitting: Urology

## 2018-02-28 ENCOUNTER — Ambulatory Visit: Payer: Medicare Other | Admitting: Urology

## 2018-03-08 ENCOUNTER — Ambulatory Visit (INDEPENDENT_AMBULATORY_CARE_PROVIDER_SITE_OTHER): Payer: Medicare Other | Admitting: Urology

## 2018-03-08 VITALS — BP 156/105 | HR 123 | Ht 63.0 in | Wt 81.0 lb

## 2018-03-08 DIAGNOSIS — R972 Elevated prostate specific antigen [PSA]: Secondary | ICD-10-CM

## 2018-03-08 DIAGNOSIS — R339 Retention of urine, unspecified: Secondary | ICD-10-CM

## 2018-03-08 DIAGNOSIS — N210 Calculus in bladder: Secondary | ICD-10-CM | POA: Diagnosis not present

## 2018-03-08 DIAGNOSIS — N401 Enlarged prostate with lower urinary tract symptoms: Secondary | ICD-10-CM | POA: Diagnosis not present

## 2018-03-08 DIAGNOSIS — N138 Other obstructive and reflux uropathy: Secondary | ICD-10-CM | POA: Diagnosis not present

## 2018-03-08 DIAGNOSIS — N200 Calculus of kidney: Secondary | ICD-10-CM

## 2018-03-08 LAB — BLADDER SCAN AMB NON-IMAGING

## 2018-03-08 NOTE — Progress Notes (Signed)
#  [id number] 

## 2018-03-08 NOTE — Progress Notes (Signed)
03/08/2018 3:09 PM   Jesse Douglas ZOX 07-23-1951 [id number]  Referring provider: Yevonne Pax, MD [address] [place], [place] [address]  Chief Complaint  Patient presents with  . Post-op Follow-up    HPI: 67 year old [place] speaking male who initially presented with symptoms of urgency, frequency and gross hematuria found to have a large bladder stone.  He was taken to the operating room on 01/10/2018 for TURP and cystolitholapaxy which was complicated by intraperitoneal bladder perforation using the [facility] serial dilators over a wire.  He underwent open cystolithotomy repair of the bladder perforation.  Follow-up cystogram showed no ongoing leak and his catheter was removed.  He returns today for routine postop follow-up.  He reports he is doing very well today.  He has no urinary symptoms other than nocturia x1-2.  This is not bothersome to him.  His urgency, frequency, dysuria and gross hematuria have all resolved.  IPSS as below.  PVR today 16 cc.  He is no longer taking Flomax.  He also has an asymptomatic  7 mm right upper pole stone and punctate right lower pole stone on CT scan.  Rectal exam up-to-date and unremarkable.  Calculi prostate volume approximately 40 cc.  He did not undergo TURP at the time of the procedure due to bladder perforation.  PSA mildly elevated to 4.4 on 12/2017 in the setting of bladder stone and irritative voiding symptoms.    IPSS    Row Name 03/08/18 1000         International Prostate Symptom Score   How often have you had the sensation of not emptying your bladder?  Almost always     How often have you had to urinate less than every two hours?  Less than half the time     How often have you found you stopped and started again several times when you urinated?  Not at All     How often have you found it difficult to postpone urination?  Not at All     How often have you had a weak urinary stream?  Not at All     How often have you had to strain  to start urination?  Not at All     How many times did you typically get up at night to urinate?  2 Times     Total IPSS Score  9       Quality of Life due to urinary symptoms   If you were to spend the rest of your life with your urinary condition just the way it is now how would you feel about that?  Pleased        Score:  1-7 Mild 8-19 Moderate 20-35 Severe     PMH: Past Medical History:  Diagnosis Date  . Bladder stone 12/2017  . Hypertension     Surgical History: Past Surgical History:  Procedure Laterality Date  . BLADDER REPAIR N/A 01/10/2018   Procedure: BLADDER REPAIR;  Surgeon: Vanna Scotland, MD;  Location: [facility] ORS;  Service: Urology;  Laterality: N/A;  . CYSTOGRAM N/A 01/10/2018   Procedure: CYSTOGRAM;  Surgeon: Vanna Scotland, MD;  Location: [facility] ORS;  Service: Urology;  Laterality: N/A;  . CYSTOSCOPY W/ RETROGRADES Bilateral 01/10/2018   Procedure: CYSTOSCOPY WITH RETROGRADE PYELOGRAM;  Surgeon: Vanna Scotland, MD;  Location: [facility] ORS;  Service: Urology;  Laterality: Bilateral;  . CYSTOSCOPY WITH LITHOLAPAXY N/A 01/10/2018   Procedure: CYSTOSCOPY WITH open cystolithotomy;  Surgeon: Vanna Scotland, MD;  Location: [facility]  ORS;  Service: Urology;  Laterality: N/A;  . NO PAST SURGERIES    . none      Home Medications:  Allergies as of 03/08/2018      Reactions   Penicillins Diarrhea, Nausea And Vomiting, Other (See Comments)   Has patient had a PCN reaction causing immediate rash, facial/tongue/throat swelling, SOB or lightheadedness with hypotension: Yes Has patient had a PCN reaction causing severe rash involving mucus membranes or skin necrosis: No Has patient had a PCN reaction that required hospitalization: No Has patient had a PCN reaction occurring within the last 10 years: No If all of the above answers are "NO", then may proceed with Cephalosporin use.      Medication List        Accurate as of 03/08/18  3:09 PM. Always use your most recent med  list.          albuterol 108 (90 Base) MCG/ACT inhaler Commonly known as:  PROVENTIL HFA;VENTOLIN HFA Inhale 2 puffs into the lungs every 6 (six) hours as needed for wheezing or shortness of breath.   docusate sodium 100 MG capsule Commonly known as:  COLACE Take 1 capsule (100 mg total) by mouth 2 (two) times daily.   Fluticasone-Salmeterol 250-50 MCG/DOSE Aepb Commonly known as:  ADVAIR DISKUS Inhale 1 puff into the lungs 2 (two) times daily.   guaiFENesin 600 MG 12 hr tablet Commonly known as:  MUCINEX Take 1 tablet (600 mg total) by mouth 2 (two) times daily.   ibuprofen 200 MG tablet Commonly known as:  ADVIL,MOTRIN Take 400 mg by mouth every 6 (six) hours as needed for headache or moderate pain.   metoprolol tartrate 25 MG tablet Commonly known as:  LOPRESSOR Take 1 tablet (25 mg total) by mouth 2 (two) times daily.   tamsulosin 0.4 MG Caps capsule Commonly known as:  FLOMAX Take 1 capsule (0.4 mg total) by mouth daily.   tiotropium 18 MCG inhalation capsule Commonly known as:  SPIRIVA HANDIHALER Place 1 capsule (18 mcg total) into inhaler and inhale daily.       Allergies:  Allergies  Allergen Reactions  . Penicillins Diarrhea, Nausea And Vomiting and Other (See Comments)    Has patient had a PCN reaction causing immediate rash, facial/tongue/throat swelling, SOB or lightheadedness with hypotension: Yes Has patient had a PCN reaction causing severe rash involving mucus membranes or skin necrosis: No Has patient had a PCN reaction that required hospitalization: No Has patient had a PCN reaction occurring within the last 10 years: No If all of the above answers are "NO", then may proceed with Cephalosporin use.     Family History: Family History  Problem Relation Age of Onset  . Lung cancer Father     Social History:  reports that he has been smoking cigarettes.  He has never used smokeless tobacco. He reports that he does not drink alcohol or use  drugs.  ROS: UROLOGY Frequent Urination?: No Hard to postpone urination?: No Burning/pain with urination?: No Get up at night to urinate?: No Leakage of urine?: No Urine stream starts and stops?: No Trouble starting stream?: No Do you have to strain to urinate?: No Blood in urine?: No Urinary tract infection?: No Sexually transmitted disease?: No Injury to kidneys or bladder?: No Painful intercourse?: No Weak stream?: No Erection problems?: No Penile pain?: No  Gastrointestinal Nausea?: No Vomiting?: No Indigestion/heartburn?: No Diarrhea?: No Constipation?: No  Constitutional Fever: No Night sweats?: No Weight loss?: No Fatigue?: No  Skin Skin rash/lesions?: No Itching?: No  Eyes Blurred vision?: No Double vision?: No  Ears/Nose/Throat Sore throat?: No Sinus problems?: No  Hematologic/Lymphatic Swollen glands?: No Easy bruising?: No  Cardiovascular Leg swelling?: No Chest pain?: No  Respiratory Cough?: No Shortness of breath?: No  Endocrine Excessive thirst?: No  Musculoskeletal Back pain?: No Joint pain?: No  Neurological Headaches?: No Dizziness?: No  Psychologic Depression?: No Anxiety?: No  Physical Exam: BP (!) 156/105   Pulse (!) 123   Ht 5\' 3"  (1.6 m)   Wt 81 lb (36.7 kg)   BMI 14.35 kg/m   Constitutional:  Alert and oriented, No acute distress.  Cachectic appearing.  [place] speaking, using telephone translator today. HEENT: [place] AT, moist mucus membranes.  Trachea midline, no masses. Cardiovascular: No clubbing, cyanosis, or edema. Respiratory: Normal respiratory effort, no increased work of breathing. GI: Abdomen is soft, nontender, nondistended, no abdominal masses.  Lower abdominal incision with well-healed, no hernia. Skin: No rashes, bruises or suspicious lesions. Neurologic: Grossly intact, no focal deficits, moving all 4 extremities. Psychiatric: Normal mood and affect.  Laboratory Data: Lab Results   Component Value Date   WBC 9.8 01/13/2018   HGB 12.1 (L) 01/13/2018   HCT 36.6 (L) 01/13/2018   MCV 87.0 01/13/2018   PLT 258 01/13/2018    Lab Results  Component Value Date   CREATININE 0.71 01/13/2018    Urinalysis N/a  Pertinent Imaging: Results for orders placed or performed in visit on 03/08/18  BLADDER SCAN AMB NON-IMAGING  Result Value Ref Range   Scan Result 16ml     Assessment & Plan:    1. Bladder stone Status post open cystolithotomy Etiology of bladder stone unclear, may be related to urethral narrowing versus underlying BPH  2. Benign prostatic hyperplasia with urinary obstruction No significant urinary complaints other than nocturia which is not bothersome No longer taking Flomax Pleased with voiding symptoms We will follow conservatively  3. Elevated PSA Mildly elevated PSA in the setting of irritative urinary symptoms and a bladder stone, advise repeat due 06/2018 (6 months) Patient reports that [place] will be moving to [place] and will seek further care from a urologist there, importance of repeat lab was stressed today and is agreeable Rectal exam otherwise in remarkable - BLADDER SCAN AMB NON-IMAGING  4. Right kidney stone Asymptomatic incidental 7 mm right upper pole stone as well as punctate right lower pole stone Given that he will be moving to [place] and is asymptomatic, will defer treatment at this time but he is aware of the stone   Return if symptoms worsen or fail to improve, for moving to [place].  Vanna ScotlandAshley Fradel Baldonado, MD  [facility] [address], [address] [place], [place] [address] [phone]

## 2018-10-02 ENCOUNTER — Telehealth: Payer: Self-pay | Admitting: Internal Medicine

## 2018-10-02 NOTE — Telephone Encounter (Signed)
Patient moved out of state and declined to schedule appt. Deleting recall per patient request.

## 2018-12-11 IMAGING — CT CT RENAL STONE PROTOCOL
2 of 7 series · 11 of 46 positions shown, 12 images · non-contrast
Comparison: Prior CT from 08/14/2015.

CLINICAL DATA: Initial evaluation for acute dysuria, hematuria.

EXAM:
CT ABDOMEN AND PELVIS WITHOUT CONTRAST
TECHNIQUE: Multidetector CT imaging of the abdomen and pelvis was performed
following the standard protocol without IV contrast.

[Series 2: stone full standard · axial · 0.60mm/px · z∈[-1138,-868]mm · 8 of 68 slices shown, 9 images]
[im 7/68  soft-tissue]
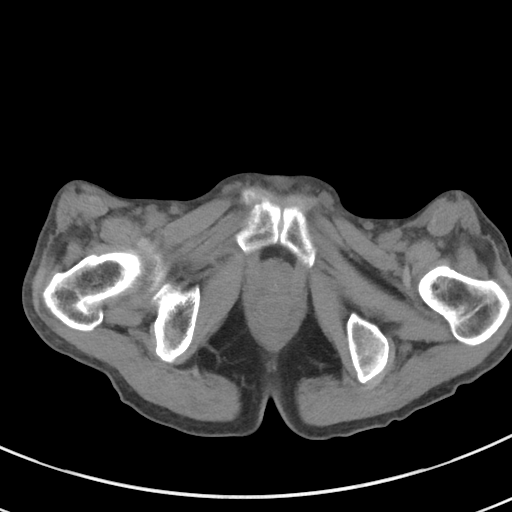
[im 7/68  bone]
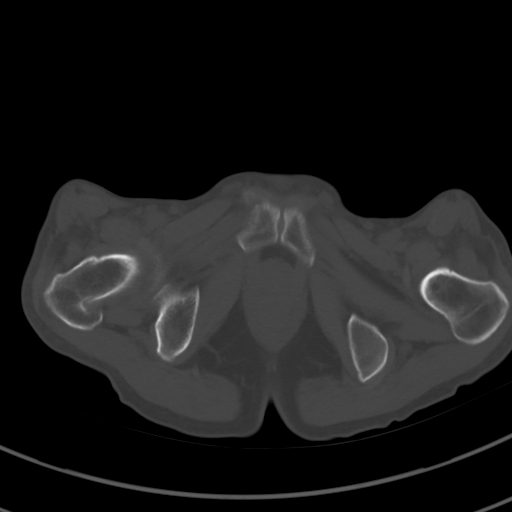
[im 13/68  soft-tissue]
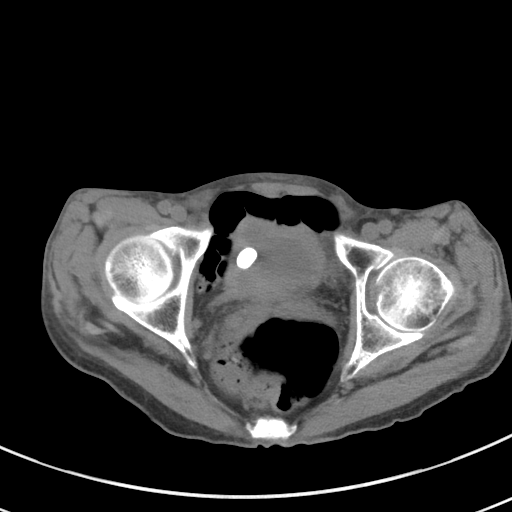
[im 23/68  soft-tissue]
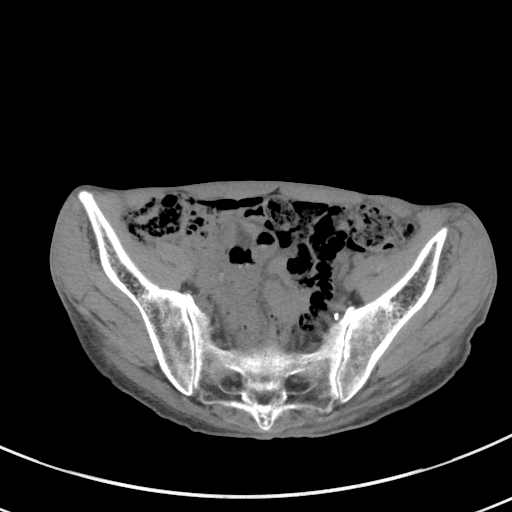
[im 29/68  soft-tissue]
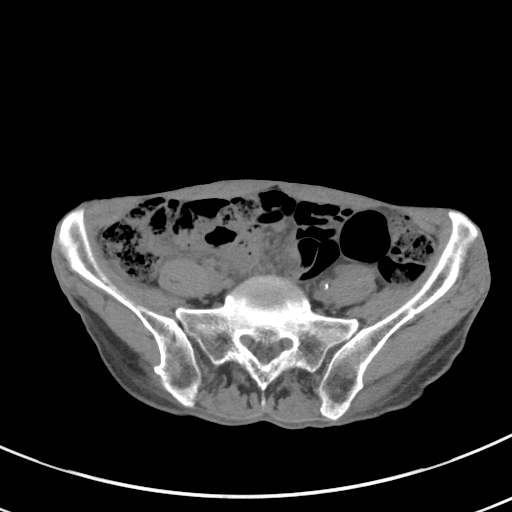
[im 39/68  soft-tissue]
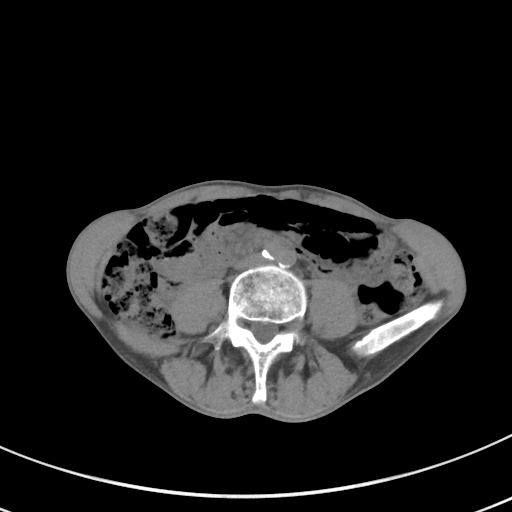
[im 45/68  soft-tissue]
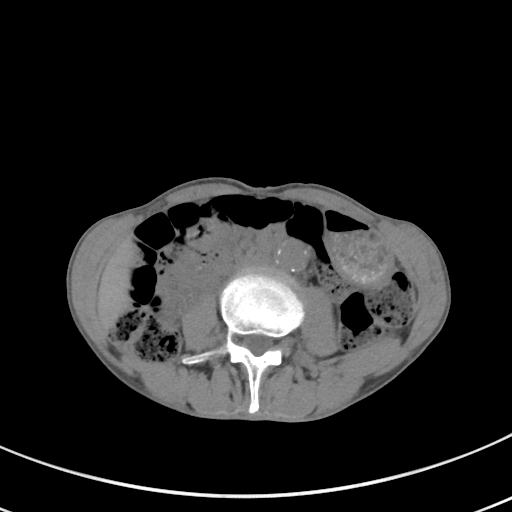
[im 55/68  soft-tissue]
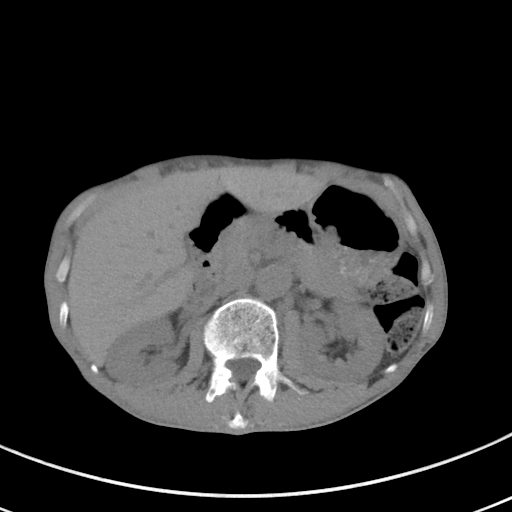
[im 61/68  soft-tissue]
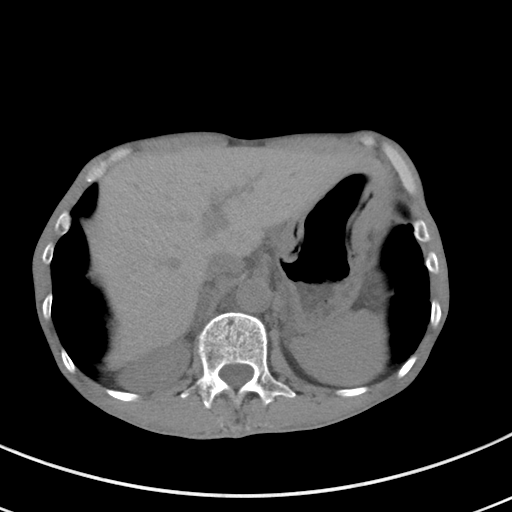

[Series 10: coronal · coronal · 0.20mm/px · 3 of 95 slices shown]
[im 24/95  soft-tissue]
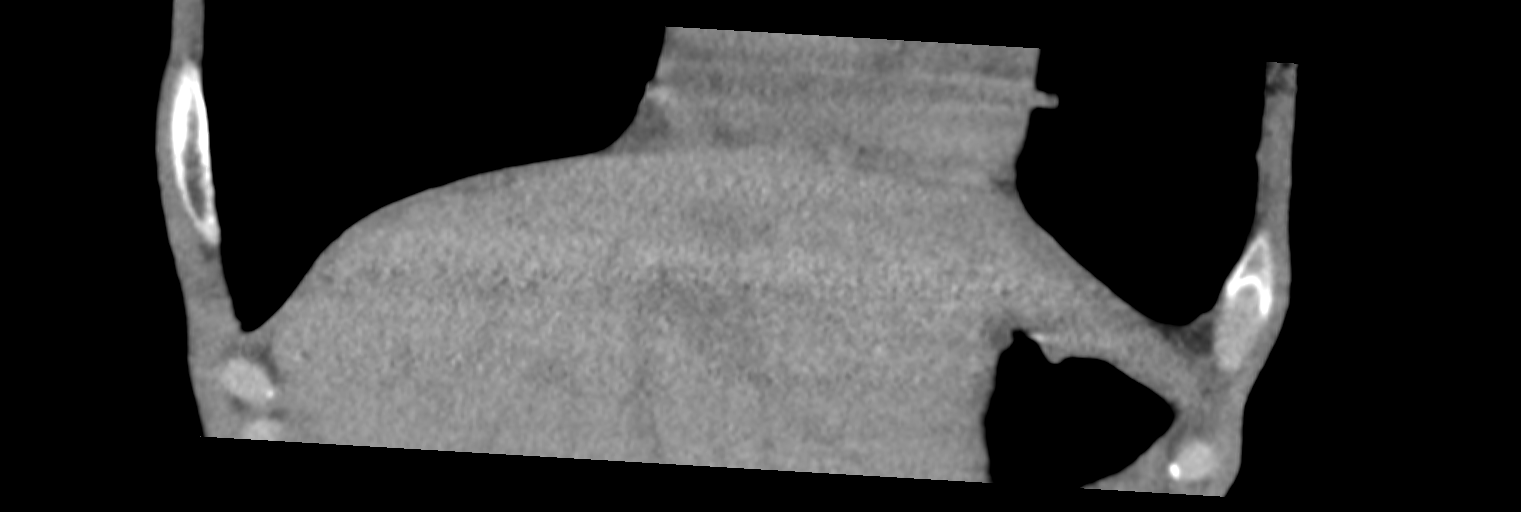
[im 48/95  soft-tissue]
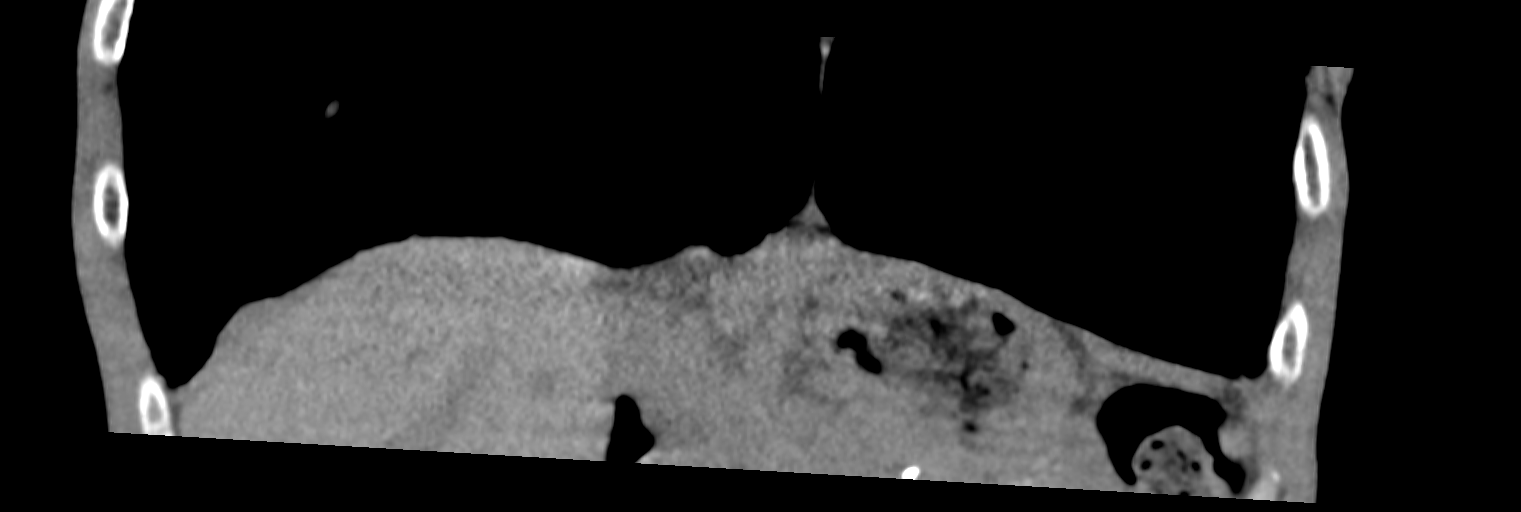
[im 71/95  soft-tissue]
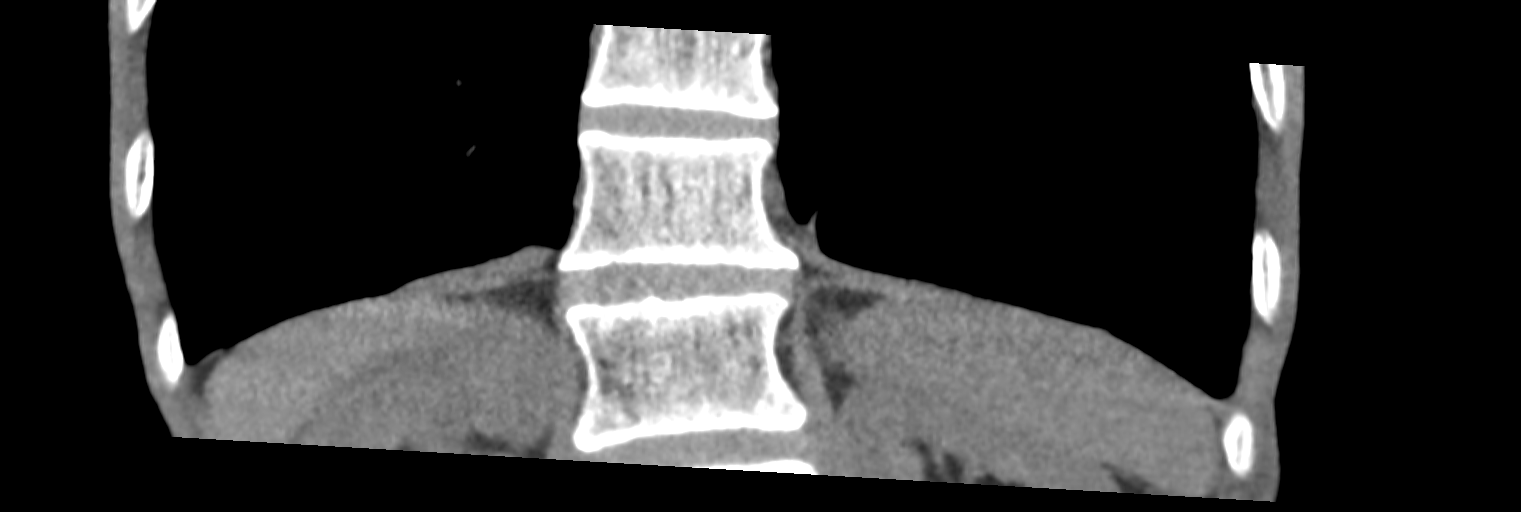

[11 of 46 positions shown; findings below may reference images not displayed]

FINDINGS: Lower chest: Severe emphysematous changes seen at the lung bases.
Visualized lungs are otherwise clear.

Hepatobiliary: Limited noncontrast evaluation of the liver is
unremarkable. Gallbladder within normal limits. No biliary
dilatation.

Pancreas: Limited noncontrast evaluation of the pancreas grossly
unremarkable.

Spleen: Spleen within normal limits.

Adrenals/Urinary Tract: Adrenal glands grossly unremarkable. Kidneys
equal in size. 7 mm nonobstructive stone present at the upper pole
the right kidney. Additional punctate 3 mm nonobstructive calculus
present at the lower pole right kidney. No other radiopaque calculi
identified within either kidney. No stones seen along the course of
either renal collecting system. No hydroureter.

Bladder largely decompressed. Large calculus measuring 14 x 11 mm
present within the right bladder lumen (series 2, image 57).
Circumferential bladder wall thickening likely related incomplete
distension, although superimposed infection could be considered as
well.

Stomach/Bowel: Stomach within normal limits. No evidence for bowel
obstruction. No acute inflammatory changes seen about the bowels.
Moderate retained stool diffusely throughout the colon.

Vascular/Lymphatic: Moderate aortic atherosclerosis. Intra-abdominal
aneurysm measures up to 3.4 cm in maximal diameter. No appreciable
adenopathy identified on this noncontrast examination.

Reproductive: Prostate mildly enlarged measuring 4.9 cm in
transverse diameter.

Other: No free air or fluid.

Musculoskeletal: No acute osseus abnormality. No worrisome lytic or
blastic osseous lesions.
IMPRESSION: 1. 14 x 11 mm calculus within the bladder lumen. Surrounding bladder
wall thickening may be related incomplete distension,
irritation/inflammation, or possibly acute infection. Correlation
with urinalysis recommended.
2. Additional nonobstructive right renal nephrolithiasis measuring
up to 7 mm. No obstructive uropathy.
3. Severe emphysema. Intra-abdominal aortic aneurysm measuring up to
3.4 cm in diameter. Recommend followup by ultrasound in 3 years.
This recommendation follows ACR consensus guidelines: White Paper of
the ACR Incidental Findings Committee II on Vascular Findings. [HOSPITAL] 0892; [DATE]

## 2019-12-28 ENCOUNTER — Emergency Department: Payer: Medicare Other

## 2019-12-28 ENCOUNTER — Emergency Department
Admission: EM | Admit: 2019-12-28 | Discharge: 2019-12-28 | Payer: Medicare Other | Attending: Emergency Medicine | Admitting: Emergency Medicine

## 2019-12-28 ENCOUNTER — Emergency Department
Admission: EM | Admit: 2019-12-28 | Discharge: 2019-12-28 | Disposition: A | Discharge status: Home / Self Care | Payer: Medicare Other | Attending: Emergency Medicine | Admitting: Emergency Medicine

## 2019-12-28 ENCOUNTER — Telehealth (INDEPENDENT_AMBULATORY_CARE_PROVIDER_SITE_OTHER): Payer: Self-pay | Admitting: Nurse Practitioner

## 2019-12-28 DIAGNOSIS — N210 Calculus in bladder: Secondary | ICD-10-CM

## 2019-12-28 DIAGNOSIS — Z9079 Acquired absence of other genital organ(s): Secondary | ICD-10-CM | POA: Insufficient documentation

## 2019-12-28 DIAGNOSIS — N401 Enlarged prostate with lower urinary tract symptoms: Secondary | ICD-10-CM | POA: Insufficient documentation

## 2019-12-28 DIAGNOSIS — N200 Calculus of kidney: Secondary | ICD-10-CM

## 2019-12-28 DIAGNOSIS — I714 Abdominal aortic aneurysm, without rupture, unspecified: Secondary | ICD-10-CM

## 2019-12-28 DIAGNOSIS — J439 Emphysema, unspecified: Secondary | ICD-10-CM | POA: Insufficient documentation

## 2019-12-28 DIAGNOSIS — R338 Other retention of urine: Secondary | ICD-10-CM

## 2019-12-28 DIAGNOSIS — F17210 Nicotine dependence, cigarettes, uncomplicated: Secondary | ICD-10-CM | POA: Insufficient documentation

## 2019-12-28 DIAGNOSIS — I10 Essential (primary) hypertension: Secondary | ICD-10-CM

## 2019-12-28 DIAGNOSIS — R339 Retention of urine, unspecified: Secondary | ICD-10-CM | POA: Insufficient documentation

## 2019-12-28 HISTORY — DX: Emphysema, unspecified: J43.9

## 2019-12-28 HISTORY — DX: Benign prostatic hyperplasia without lower urinary tract symptoms: N40.0

## 2019-12-28 HISTORY — DX: Other pulmonary collapse: J98.19

## 2019-12-28 LAB — COMPREHENSIVE METABOLIC PANEL
ALT: 13 U/L (ref 0–55)
AST (SGOT): 21 U/L (ref 5–34)
Albumin/Globulin Ratio: 1.3 (ref 0.9–2.2)
Albumin: 4.1 g/dL (ref 3.5–5.0)
Alkaline Phosphatase: 73 U/L (ref 38–106)
Anion Gap: 8 (ref 5.0–15.0)
BUN: 15 mg/dL (ref 9–28)
Bilirubin, Total: 0.7 mg/dL (ref 0.2–1.2)
CO2: 28 mEq/L (ref 22–29)
Calcium: 9.7 mg/dL (ref 7.9–10.2)
Chloride: 102 mEq/L (ref 100–111)
Creatinine: 0.7 mg/dL (ref 0.7–1.3)
Globulin: 3.1 g/dL (ref 2.0–3.6)
Glucose: 87 mg/dL (ref 70–100)
Potassium: 4.7 mEq/L (ref 3.5–5.1)
Protein, Total: 7.2 g/dL (ref 6.0–8.3)
Sodium: 138 mEq/L (ref 136–145)

## 2019-12-28 LAB — URINALYSIS REFLEX TO MICROSCOPIC EXAM - REFLEX TO CULTURE
Bilirubin, UA: NEGATIVE
Glucose, UA: NEGATIVE
Ketones UA: NEGATIVE
Nitrite, UA: NEGATIVE
Protein, UR: 30 — AB
Specific Gravity UA: 1.008 (ref 1.001–1.035)
Urine pH: 7 (ref 5.0–8.0)
Urobilinogen, UA: NEGATIVE mg/dL (ref 0.2–2.0)

## 2019-12-28 LAB — CBC AND DIFFERENTIAL
Absolute NRBC: 0 10*3/uL (ref 0.00–0.00)
Basophils Absolute Automated: 0.05 10*3/uL (ref 0.00–0.08)
Basophils Automated: 0.8 %
Eosinophils Absolute Automated: 0.03 10*3/uL (ref 0.00–0.44)
Eosinophils Automated: 0.5 %
Hematocrit: 42.6 % (ref 37.6–49.6)
Hgb: 13.6 g/dL (ref 12.5–17.1)
Immature Granulocytes Absolute: 0.01 10*3/uL (ref 0.00–0.07)
Immature Granulocytes: 0.2 %
Lymphocytes Absolute Automated: 1.06 10*3/uL (ref 0.42–3.22)
Lymphocytes Automated: 17.2 %
MCH: 28.5 pg (ref 25.1–33.5)
MCHC: 31.9 g/dL (ref 31.5–35.8)
MCV: 89.3 fL (ref 78.0–96.0)
MPV: 9.8 fL (ref 8.9–12.5)
Monocytes Absolute Automated: 0.47 10*3/uL (ref 0.21–0.85)
Monocytes: 7.6 %
Neutrophils Absolute: 4.56 10*3/uL (ref 1.10–6.33)
Neutrophils: 73.7 %
Nucleated RBC: 0 /100 WBC (ref 0.0–0.0)
Platelets: 245 10*3/uL (ref 142–346)
RBC: 4.77 10*6/uL (ref 4.20–5.90)
RDW: 13 % (ref 11–15)
WBC: 6.18 10*3/uL (ref 3.10–9.50)

## 2019-12-28 LAB — GFR: EGFR: >60

## 2019-12-28 MED ORDER — CEFDINIR 300 MG PO CAPS
300.00 mg | ORAL_CAPSULE | Freq: Two times a day (BID) | ORAL | 0 refills | Status: DC
Start: 2019-12-28 — End: 2019-12-28

## 2019-12-28 MED ORDER — LIDOCAINE(URO-JET) 2% JELLY (WRAP)
Freq: Once | CUTANEOUS | Status: AC
Start: 2019-12-28 — End: 2019-12-28
  Filled 2019-12-28: qty 1

## 2019-12-28 MED ORDER — SODIUM CHLORIDE 0.9 % IV BOLUS
1000.00 mL | Freq: Once | INTRAVENOUS | Status: AC
Start: 2019-12-28 — End: 2019-12-28
  Administered 2019-12-28: 15:00:00 1000 mL via INTRAVENOUS

## 2019-12-28 MED ORDER — LABETALOL HCL 5 MG/ML IV SOLN (WRAP)
10.00 mg | Freq: Once | INTRAVENOUS | Status: DC
Start: 2019-12-28 — End: 2019-12-28

## 2019-12-28 MED ORDER — CEFDINIR 300 MG PO CAPS
300.00 mg | ORAL_CAPSULE | Freq: Two times a day (BID) | ORAL | 0 refills | Status: AC
Start: 2019-12-28 — End: 2020-01-04

## 2019-12-28 NOTE — ED Notes (Addendum)
Called [facility] direct admit line again and they have paged [facility] Urologist twice now and they will page them to again      [phone] [facility] called back for Urologist on the line

## 2019-12-28 NOTE — Discharge Instructions (Addendum)
You were seen for dysuria and difficulty urinating.  It appears he have a stone in your bladder as well as in your right kidney.  We have attempted to insert a Foley catheter without success and have referred you to [facility] emergency department.      Please also follow-up up with vascular surgery regarding your abdominal aorta aneurysm.  If symptoms worsen including abdominal pain, chest pain, or obstruction of your Foley catheter, come back to the emergency department.

## 2019-12-28 NOTE — ED Notes (Signed)
NP Murrell Converse and I attempted to insert a 16 foley and a 14 coude without success.  EMT Manson Allan assisted.  Attending MD Imogene Burn notified.

## 2019-12-28 NOTE — ED Notes (Signed)
Called [facility] direct admit line and they will page on call [facility] Urologist and have them call back

## 2019-12-28 NOTE — ED Notes (Signed)
I assisted MD Imogene Burn in an attempted foley insertion but we were unsuccessful.

## 2019-12-28 NOTE — ED Triage Notes (Signed)
Pt ambulatory reporting that he had surgery for prostate 3 years ago so normally has frequency with urination. For the past two weeks, he has pain with urination and dripping instead of a "flow". No blood noted in urine. Last urinated a few minutes ago.

## 2019-12-28 NOTE — Telephone Encounter (Signed)
That's fine

## 2019-12-28 NOTE — ED Notes (Addendum)
Called [facility] direct admit line and they will page the [facility] Urologist again and stated that it was stat due to unable to get foley into the Patient due to blockage    [facility] Urologist from [facility] called back at [phone]

## 2019-12-28 NOTE — Consults (Signed)
CONSULTATION    Date Time: 12/28/19 8:17 PM  Patient Name: [name] [name],[name] [name]  Requesting Physician: No att. providers found       Reason for Consultation:   BPH with LUTS  Urinary Retention  Bladder Stone  Right Kidney Stone    Assessment:   69 y/o M with large bladder stone, irritative voiding symptoms, and urinary retention    Plan:   1. 6F coude catheter was placed at bedside.  Urine draining clear.  Foley position confirmed via sonosite and manual irrigation.  2. Maintain foley to gravity upon discharge.  3. Recommend Omnicef 300mg  twice daily for 7 days  4. Will plan for outpatient cystolitholapaxy next week.  5. Thank you for your assistance with Mr. Benner care.  Please call with questions or concerns.    History:   [name] [name] is a 69 y.o. male who presents to the hospital on 12/28/2019 with a PMH significant for BPH.  Mr. Kris reportedly underwent a suprapubic simple prostatectomy in [place] approximately 3 years ago.  Mr. Linden has noted worsening FOS, difficulty with bladder emptying, and dysuria over the past few weeks.  Mr. Kirkman was seen at [facility] ER today where a CT A/P showed a 10mm R renal stone and 2.3cm bladder stone.  Concern for urinary retention.  Unable to place foley catheters at [facility] ER.  Mr. Sill was sent here for further urologic management.    Past Medical History:     Past Medical History:   Diagnosis Date    Collapsed lung     Emphysema lung     Enlarged prostate      PMH reviewed  Past Surgical History:     Past Surgical History:   Procedure Laterality Date    PROSTATE SURGERY       PSH reviewed  Family History:   No family history on file.    FH reviewed  Social History:     Social History     Socioeconomic History    Marital status: Unknown     Spouse name: Not on file    Number of children: Not on file    Years of education: Not on file    Highest education level: Not on file   Occupational History    Not on file   Social Needs    Financial resource strain: Not on file    Food insecurity     Worry:  Not on file     Inability: Not on file    Transportation needs     Medical: Not on file     Non-medical: Not on file   Tobacco Use    Smoking status: Current Every Day Smoker     Years: 50.00     Types: Cigarettes    Smokeless tobacco: Never Used   Substance and Sexual Activity    Alcohol use: Never     Frequency: Never    Drug use: Never    Sexual activity: Not on file   Lifestyle    Physical activity     Days per week: Not on file     Minutes per session: Not on file    Stress: Not on file   Relationships    Social connections     Talks on phone: Not on file     Gets together: Not on file     Attends religious service: Not on file     Active member of club or organization: Not on file  Attends meetings of clubs or organizations: Not on file     Relationship status: Not on file    Intimate partner violence     Fear of current or ex partner: Not on file     Emotionally abused: Not on file     Physically abused: Not on file     Forced sexual activity: Not on file   Other Topics Concern    Not on file   Social History Narrative    Not on file     SH reviewed  Allergies:   No Known Allergies    Medications:   None    Review of Systems:   History obtained from the patient  General ROS: negative for - chills, fatigue, fever or weight loss  Psychological ROS: negative for - depression, disorientation or memory difficulties  Ophthalmic ROS: negative for - blurry vision or loss of vision  Hematological and Lymphatic ROS: negative for - bleeding problems, night sweats or swollen lymph nodes  Endocrine ROS: negative for - malaise/lethargy, polydipsia/polyuria or skin changes  Respiratory ROS: no cough, shortness of breath, or wheezing  Cardiovascular ROS: no chest pain or dyspnea on exertion  Gastrointestinal ROS: no abdominal pain, change in bowel habits, or black or bloody stools  Genito-Urinary ROS: +dysuria, +weak stream, +urinary retention  Musculoskeletal ROS: negative for - joint pain or muscle  pain  Neurological ROS: negative for - headaches, impaired coordination/balance or numbness/tingling      Physical Exam:     Vitals:    12/28/19 2006   BP: 129/85   Pulse: (!) 102   Resp: 20   Temp: 97.5 F (36.4 C)   SpO2: 98%       Intake and Output Summary (Last 24 hours) at Date Time  No intake or output data in the 24 hours ending 12/28/19 2017    General appearance - alert, well appearing, and in no distress  Mental status - alert, oriented to person, place, and time  Throat - Lips, mucosa, and tongue normal; teeth and gums normal  Neck - Supple, symmetrical, trachea midline, no adenopathy;      Chest - clear to auscultation, no wheezes, rales or rhonchi, symmetric air entry  Heart - normal rate and regular rhythm  Abdomen - soft, nontender, nondistended, no masses or organomegaly  GU  - low midline incision.  Bladder distended, slightly tender, normal phallus, no lesions.  Neurologic -  Normal strength, sensation and reflexes throughout  Musculoskeletal - no joint tenderness, deformity or swelling  Extremities - peripheral pulses normal, no pedal edema, no clubbing or cyanosis      Labs Reviewed:     Results     ** No results found for the last 24 hours. **      WBC 6.18  Cr 0.7  UA trace LE, protein, small RBC  Rads:   Ct Abd/pelvis Without Contrast    Result Date: 12/28/2019  1. Nonobstructive right renal calculi. Large bladder calculus. 2. Aneurysmal dilatation of the abdominal aorta which measures 3.3 cm in diameter. [name] Mu, DO  12/28/2019 2:49 PM      Signed by: Miquel Dunn, MD

## 2019-12-28 NOTE — Discharge Instructions (Signed)
Dear [name]:    Thank you for choosing the [facility] Emergency Department, the premier emergency department in the [place] area.  I hope your visit today was EXCELLENT. You will receive a survey via text message that will give you the opportunity to provide feedback to your team about your visit. Please do not hesitate to reach out with any questions!    Specific instructions for your visit today:      Foley Catheter Care    You have had a Foley catheter placed to drain your urine (pee).    This is most often needed when there is a blockage at the base of the bladder, in the prostate or in the urethra (the tube that carries urine from the bladder to the outside). Usually the tube is only needed for a few days.    The catheter is attached to a bag. You will need to change the bag when it gets full. There are two types of bags: a leg bag, which is easier to wear under clothes, and a larger bag, which may be easier to use at night. The nurse will show you how to properly change the bag.    The catheter is put into the bladder under sterile conditions. However, an infection can still develop. The catheter can also get obstructed (blocked).    Infection is the most common problem with Foley catheters. Some signs & symptoms of infection are:   Fever (temperature higher than 100.38F / 38C), malaise (a feeling of unwellness) and chills. Nausea (feeling sick to the stomach) and vomiting (throwing up) are also symptoms. There may be burning of the urethra, penis/vagina or pubic area.   Urine leaking from around the catheter.   Increased spasms of the legs, abdomen (belly) or bladder. Mild, infrequent spasms are normal.   Sediment (cloudy deposits) or mucus in the urine. There may be cloudy urine, bloody (pink or red) urine or urine that smells very bad.    Use the instructions below for daily Foley catheter care. Doing so will help reduce the risk of infection:   Wash your hands with soap and water before  and after doing any catheter care.   Clean the skin around the catheter insertion site with soap and water. Use a warm washcloth. Gently hold onto the catheter while washing. Remove any crust or debris (waste) on the skin and catheter.   Always keep the catheter bag below the level of the bladder. This keeps urine in the bag from going back into your bladder. This will help to prevent an infection   Drink plenty of fluids. Drink a few glasses of water a day unless your doctor has limited the amount of fluids you can have. If you are thirsty, you havent had enough to drink today!   Empty the urine bag when it is about 2/3 full. Wash your hands with soap and water before you empty the bag. Drain the bag right into the toilet or a container on the floor. Take off the tip cover and open the slide valve. Let the urine flow out. Do not touch the end of the drain spout.    Your doctor has decided that antibiotics will be prescribed to help prevent an infection    YOU SHOULD SEEK MEDICAL ATTENTION IMMEDIATELY, EITHER HERE OR AT THE NEAREST EMERGENCY DEPARTMENT, IF ANY OF THE FOLLOWING OCCURS:   Fever (temperature higher than 100.38F / 38C), flank (side) pain, nausea (sick to your stomach)  or vomiting (throwing up), shaking chills.   Repeated feelings of bladder blockage with no urine coming out, low abdominal (belly) pain or leaking around the tube. This may mean the catheter is blocked or is not draining properly.   Any other worsening symptoms or any other concerns.   The urine is cloudy and smells bad.   Urine leaks around the catheter or the catheter falls out.             {   Specific instructions   :45308::"***"}    IF YOU DO NOT CONTINUE TO IMPROVE OR YOUR CONDITION WORSENS, PLEASE CONTACT YOUR DOCTOR OR RETURN IMMEDIATELY TO THE EMERGENCY DEPARTMENT.    Sincerely,  Aslani-Amoli, Toma Deiters, MD  Attending Emergency Physician  [facility] Emergency Department    ONSITE PHARMACY  Our full service  onsite pharmacy is located in the ER waiting room.  Open 7 days a week from 9 am to 9 pm.  We accept all major insurances and prices are competitive with major retailers.  Ask your provider to print your prescriptions down to the pharmacy to speed you on your way home.    OBTAINING A PRIMARY CARE APPOINTMENT    Primary care physicians (PCPs, also known as primary care doctors) are either internists or family medicine doctors. Both types of PCPs focus on health promotion, disease prevention, patient education and counseling, and treatment of acute and chronic medical conditions.    Call for an appointment with a primary care doctor.  Ask to see who is taking new patients.     [facility]  telephone:  [phone]  [email or web address]    DOCTOR REFERRALS  Call [phone] (available 24 hours a day, 7 days a week) if you need any further referrals and we can help you find a primary care doctor or specialist.  Also, available online at:  [email or web address]    YOUR CONTACT INFORMATION  Before leaving please check with registration to make sure we have an up-to-date contact number.  You can call registration at [phone] to update your information.  For questions about your hospital bill, please call [phone].  For questions about your Emergency Dept Physician bill please call [phone].      FREE HEALTH SERVICES  If you need help with health or social services, please call 2-1-1 for a free referral to resources in your area.  2-1-1 is a free service connecting people with information on health insurance, free clinics, pregnancy, mental health, dental care, food assistance, housing, and substance abuse counseling.  Also, available online at:  http://www.[facility].org    MEDICAL RECORDS AND TESTS  Certain laboratory test results do not come back the same day, for example urine cultures.   We will contact you if other important findings are noted.  Radiology films are often  reviewed again to ensure accuracy.  If there is any discrepancy, we will notify you.      Please call [phone] to pick up a complimentary CD of any radiology studies performed.  If you or your doctor would like to request a copy of your medical records, please call [phone].      ORTHOPEDIC INJURY   Please know that significant injuries can exist even when an initial x-ray is read as normal or negative.  This can occur because some fractures (broken bones) are not initially visible on x-rays.  For this reason, close outpatient follow-up with your primary care doctor or bone specialist (orthopedist) is required.    MEDICATIONS  AND FOLLOWUP  Please be aware that some prescription medications can cause drowsiness.  Use caution when driving or operating machinery.    The examination and treatment you have received in our Emergency Department is provided on an emergency basis, and is not intended to be a substitute for your primary care physician.  It is important that your doctor checks you again and that you report any new or remaining problems at that time.      24 HOUR PHARMACIES  The nearest 24 hour pharmacy is:    [facility]  [address]  [place], [place] [address]  [phone]      ASSISTANCE WITH INSURANCE    Affordable Care Act  [facility])  Call to start or finish an application, compare plans, enroll or ask a question.  [phone]  TTY: 5402975632  Web:  Healthcare.gov    Help Enrolling in [facility]  [employer] [place]  (360)618-9908 (TOLL-FREE)  412-368-3509 (TTY)  Web:  Http://www.coverva.org    Local Help Enrolling in the [facility]  [place] [facility]  3012172158 (MAIN)  Email:  health-help@nvfs .org  Web:  [employer]jackMyths.is  Address:  [address], [address] [place], [place] [address]    SEDATING MEDICATIONS  Sedating medications include strong pain medications (e.g. narcotics), muscle relaxers, benzodiazepines (used for anxiety and as muscle relaxers),  Benadryl/diphenhydramine and other antihistamines for allergic reactions/itching, and other medications.  If you are unsure if you have received a sedating medication, please ask your physician or nurse.  If you received a sedating medication: DO NOT drive a car. DO NOT operate machinery. DO NOT perform jobs where you need to be alert.  DO NOT drink alcoholic beverages while taking this medicine.     If you get dizzy, sit or lie down at the first signs. Be careful going up and down stairs.  Be extra careful to prevent falls.     Never give this medicine to others.     Keep this medicine out of reach of children.     Do not take or save old medicines. Throw them away when outdated.     Keep all medicines in a cool, dry place. DO NOT keep them in your bathroom medicine cabinet or in a cabinet above the stove.    MEDICATION REFILLS  Please be aware that we cannot refill any prescriptions through the ER. If you need further treatment from what is provided at your ER visit, please follow up with your primary care doctor or your pain management specialist.    FREESTANDING EMERGENCY DEPARTMENTS OF [facility]  Did you know [facility] has two freestanding ERs located just a few miles away?  [place] [facility] [place] and [place] [facility] [place] have short wait times, easy free parking directly in front of the building and top patient satisfaction scores - and the same Board Certified Emergency Medicine doctors as [facility].

## 2019-12-28 NOTE — ED Notes (Signed)
I spoke with NP Murrell Converse who said this was a discharge and no EMTALA was necessary.  I also spoke with charge RN Ebbie Latus who said this is done by sometimes.

## 2019-12-28 NOTE — ED Notes (Signed)
Bladder scan , 30 min after voiding in the waiting room

## 2019-12-28 NOTE — ED Provider Notes (Addendum)
[facility] EMERGENCY DEPARTMENT H&P      Visit date: (Not on file)      CLINICAL SUMMARY           Diagnosis:    .     Final diagnoses:   Urinary retention         MDM Notes:    Patient with known bladder stone presents with urinary retention. Was seen in [facility] and unable to insert catheter. Dr. Leonor Liv at bedside and coud catheter 18 [place] in place. Patient started on Omnicef and will follow up as an outpatient.           Disposition:         Discharge               Discharge Prescriptions     Medication Sig Dispense Auth. Provider    cefdinir (OMNICEF) 300 MG capsule  (Status: Discontinued) Take 1 capsule (300 mg total) by mouth 2 (two) times daily for 7 days 14 capsule Aslani-Amoli, Nayelly Laughman, MD    cefdinir (OMNICEF) 300 MG capsule Take 1 capsule (300 mg total) by mouth 2 (two) times daily for 7 days 14 capsule Aslani-Amoli, Toma Deiters, MD                         CLINICAL INFORMATION        HPI:      Chief Complaint: Urinary Retention  .    [name] [name] is a 69 y.o. male who presents with urinary retention. Patient was seen at [facility] and unable to insert catheter, sent here for Foley catheter insertion by urology. Dr. Leonor Liv aware. Patient has a known bladder stone.    History obtained from: patient, review of prior chart          ROS:      Positive and negative ROS elements as per HPI.  All other systems reviewed and negative.      Physical Exam:      Pulse (!) 117   BP (!) 159/105   Resp 20   SpO2 96 %   Temp 97.5 F (36.4 C)    Physical Exam  Vitals signs reviewed.   Constitutional:       Appearance: He is well-developed.   HENT:      Head: Normocephalic and atraumatic.   Eyes:      Conjunctiva/sclera: Conjunctivae normal.      Pupils: Pupils are equal, round, and reactive to light.   Neck:      Musculoskeletal: Normal range of motion and neck supple.   Cardiovascular:      Rate and Rhythm: Normal rate and regular rhythm.   Pulmonary:      Effort: Pulmonary effort is  normal.      Breath sounds: Normal breath sounds.   Abdominal:      General: Bowel sounds are normal.      Palpations: Abdomen is soft.   Musculoskeletal: Normal range of motion.   Skin:     General: Skin is warm and dry.   Neurological:      Mental Status: He is alert and oriented to person, place, and time.                    PAST HISTORY        Primary Care Provider: No primary care provider on file.        PMH/PSH:    .  Past Medical History:   Diagnosis Date    Collapsed lung     Emphysema lung     Enlarged prostate        He has a past surgical history that includes Prostate surgery.      Social/Family History:      He reports that he has been smoking cigarettes. He has smoked for the past 50.00 years. He has never used smokeless tobacco. He reports that he does not drink alcohol or use drugs.    No family history on file.      Listed Medications on Arrival:    .     Home Medications     Med List Status: In Progress Set By: Leron Croak, RN at 12/28/2019  7:49 PM        No Medications         Allergies: He has No Known Allergies.            VISIT INFORMATION        Clinical Course in the ED:                 Medications Given in the ED:    .     ED Medication Orders (From admission, onward)    None            Procedures:      Procedures      Interpretations:      O2 sat-           saturation: 96 %; Oxygen use: room air; Interpretation: Normal    Monitor -         interpreted by me: normal sinus at 98.                 RESULTS        Lab Results:      Results     ** No results found for the last 24 hours. **              Radiology Results:      No orders to display               Scribe Attestation:      No scribe involved in the care of this patient

## 2019-12-28 NOTE — ED Provider Notes (Signed)
EMERGENCY DEPARTMENT NOTE     HISTORY OF PRESENT ILLNESS   Historian:Patient and son  Translator Used: no    Chief Complaint: Dysuria       69 y.o. male with a PMH of emphysema, collapsed lung, and prostate surgery 3 years ago presents with dysuria and difficulty urinating x2 days.  Denies fever chills, nausea vomiting, flank pain, or other symptoms.    1. Location of symptoms: GU  2. Onset of symptoms: 2 weeks  3. What was patient doing when symptoms started (Context): see above  4. Severity: moderate  5. Timing: Intermittent  6. Activities that worsen symptoms: None  7. Activities that improve symptoms: None  8. Quality: Burning, difficulty urinating  9. Radiation of symptoms: no  10. Associated signs and Symptoms: see above  11. Are symptoms worsening? yes  MEDICAL HISTORY     Past Medical History:  Past Medical History:   Diagnosis Date    Collapsed lung     Emphysema lung     Enlarged prostate        Past Surgical History:  Past Surgical History:   Procedure Laterality Date    PROSTATE SURGERY         Social History:  Social History     Socioeconomic History    Marital status: Unknown     Spouse name: Not on file    Number of children: Not on file    Years of education: Not on file    Highest education level: Not on file   Occupational History    Not on file   Social Needs    Financial resource strain: Not on file    Food insecurity     Worry: Not on file     Inability: Not on file    Transportation needs     Medical: Not on file     Non-medical: Not on file   Tobacco Use    Smoking status: Current Every Day Smoker     Years: 50.00     Types: Cigarettes    Smokeless tobacco: Never Used   Substance and Sexual Activity    Alcohol use: Never     Frequency: Never    Drug use: Never    Sexual activity: Not on file   Lifestyle    Physical activity     Days per week: Not on file     Minutes per session: Not on file    Stress: Not on file   Relationships    Social connections     Talks on phone: Not  on file     Gets together: Not on file     Attends religious service: Not on file     Active member of club or organization: Not on file     Attends meetings of clubs or organizations: Not on file     Relationship status: Not on file    Intimate partner violence     Fear of current or ex partner: Not on file     Emotionally abused: Not on file     Physically abused: Not on file     Forced sexual activity: Not on file   Other Topics Concern    Not on file   Social History Narrative    Not on file       Family History:  History reviewed. No pertinent family history.    Outpatient Medication:  There are no discharge medications for this patient.  REVIEW OF SYSTEMS   Review of Systems   Constitutional: Negative for fever.   Gastrointestinal: Negative for abdominal pain, nausea and vomiting.   Genitourinary: Positive for dysuria. Negative for flank pain, frequency, hematuria and urgency.   All other systems reviewed and are negative.      PHYSICAL EXAM     ED Triage Vitals [12/28/19 1239]   Enc Vitals Group      BP (!) 144/91      Heart Rate (!) 109      Resp Rate 18      Temp 97.5 F (36.4 C)      Temp Source Oral      SpO2 96 %      Weight (!) 36.4 kg      Height 1.575 m      Head Circumference       Peak Flow       Pain Score 5      Pain Loc       Pain Edu?       Excl. in GC?      Vitals:    12/28/19 1400 12/28/19 1515 12/28/19 1600 12/28/19 1800   BP: 118/88 (!) 153/92 (!) 163/99 (!) 143/98   Pulse: 93 95 97 99   Resp: 20 20 20 20    Temp:  97.6 F (36.4 C)  97.8 F (36.6 C)   TempSrc:  Oral  Oral   SpO2: 97% 97% 98% 98%   Weight:       Height:         Physical Exam  Vitals signs and nursing note reviewed.   Constitutional:       Appearance: Normal appearance.   HENT:      Head: Normocephalic and atraumatic.   Eyes:      Extraocular Movements: Extraocular movements intact.      Conjunctiva/sclera: Conjunctivae normal.      Pupils: Pupils are equal, round, and reactive to light.   Neck:       Musculoskeletal: Normal range of motion and neck supple.   Cardiovascular:      Rate and Rhythm: Normal rate and regular rhythm.      Heart sounds: Normal heart sounds.   Pulmonary:      Effort: Pulmonary effort is normal.      Breath sounds: Normal breath sounds.   Abdominal:      General: Bowel sounds are normal. There is no distension.      Palpations: Abdomen is soft.      Tenderness: There is no abdominal tenderness. There is no right CVA tenderness or left CVA tenderness.   Musculoskeletal: Normal range of motion.   Skin:     General: Skin is warm.   Neurological:      General: No focal deficit present.      Mental Status: He is alert and oriented to person, place, and time.   Psychiatric:         Mood and Affect: Mood normal.         Behavior: Behavior normal.         Thought Content: Thought content normal.         Judgment: Judgment normal.         MEDICAL DECISION MAKING     DISCUSSION       Patient was able to urinate without difficulty when first arrived in ED. Bladder scan showed after voiding 30 minutes prior.  Small amount of blood noted on  UA. Plan for CT abd/pelvis WO to r/o nephrolithiasis    +nonobstructing nephrolithiasis of R kidney, bladder calculus 2.3cm, and incidental finding of AAA 3.3cm    Spoke with IR, refer patient to vascular surgery for follow up regarding AAA    [facility], [place]. Appropriate for foley catheter and outpatient follow up.    Three attempts to place foley catheter, including a coude, by RN, myself, and Dr. Imogene Burn without success.     Spoke with Dr. Leonor Liv. Will send patient to [facility] ED for foley insertion for urinary retention with urology consult if necessary.   Spoke with Dr. Daron Offer at [facility] ED. She is aware of patient's arrival POV.     Results and plan of care discussed with patient and son. Recommended to go to [facility] ED and appropriate follow ups regarding findings. Strict return precautions given. Discharge instructions were reviewed with patient and and they were  given the opportunity to ask any questions regarding their care today. Patient and son understood and agreed with treatment and plan of care. Stable to discharge.     DDx: urinary retention, BPH, nephrolithiasis, acute cystitis, bladder calculus       The patient is NOT septic.  All labs and vital signs from the current visit have been   reviewed and any abnormality that is present is not due to sepsis.    Vital Signs: Reviewed the patients vital signs.   Nursing Notes: Reviewed and utilized available nursing notes.  Medical Records Reviewed: Reviewed available past medical records.  Counseling: The emergency provider has spoken with the patient and discussed todays findings, in addition to providing specific details for the plan of care.  Questions are answered and there is agreement with the plan.        RADIOLOGY IMAGING STUDIES      CT Abd/Pelvis without Contrast   Final Result      1. Nonobstructive right renal calculi. Large bladder calculus.   2. Aneurysmal dilatation of the abdominal aorta which measures 3.3 cm in   diameter.       Fonnie Mu, DO    12/28/2019 2:49 PM            PULSE OXIMETRY    Oxygen Saturation by Pulse Oximetry: 98%  Interventions: none  Interpretation:  Stable on RA    EMERGENCY DEPT. MEDICATIONS      ED Medication Orders (From admission, onward)    Start Ordered     Status Ordering Provider    12/28/19 1748 12/28/19 1747    Once     Route: Intravenous  Ordered Dose: 10 mg     Discontinued Roneshia Drew D    12/28/19 1657 12/28/19 1656  lidocaine (URO-JET) 2% jelly  Once in ED     Route: Topical     Last MAR action: Given Sydney Azure D    12/28/19 1344 12/28/19 1343  sodium chloride 0.9 % bolus 1,000 mL  Once     Route: Intravenous  Ordered Dose: 1,000 mL     Last MAR action: Stopped Kiyonna Tortorelli D          LABORATORY RESULTS    Ordered and independently interpreted AVAILABLE laboratory tests. Please see results section in chart for full details.  Results for orders placed or  performed during the hospital encounter of 12/28/19   UA Reflex to Micro - Reflex to Culture    Specimen: Urine, Clean Catch   Result Value Ref Range    Urine  Type Urine, Clean Ca     Color, UA Yellow Colorless - Yellow    Clarity, UA Clear Clear - Hazy    Specific Gravity UA 1.008 1.001 - 1.035    Urine pH 7.0 5.0 - 8.0    Leukocyte Esterase, UA Trace (A) Negative    Nitrite, UA Negative Negative    Protein, UR 30 (A) Negative    Glucose, UA Negative Negative    Ketones UA Negative Negative    Urobilinogen, UA Negative 0.2 - 2.0 mg/dL    Bilirubin, UA Negative Negative    Blood, UA Small (A) Negative    RBC, UA 3 - 5 0 - 5 /hpf    WBC, UA 0 - 5 0 - 5 /hpf   CBC and differential   Result Value Ref Range    WBC 6.18 3.10 - 9.50 x10 3/uL    Hgb 13.6 12.5 - 17.1 g/dL    Hematocrit 16.1 09.6 - 49.6 %    Platelets 245 142 - 346 x10 3/uL    RBC 4.77 4.20 - 5.90 x10 6/uL    MCV 89.3 78.0 - 96.0 fL    MCH 28.5 25.1 - 33.5 pg    MCHC 31.9 31.5 - 35.8 g/dL    RDW 13 11 - 15 %    MPV 9.8 8.9 - 12.5 fL    Neutrophils 73.7 None %    Lymphocytes Automated 17.2 None %    Monocytes 7.6 None %    Eosinophils Automated 0.5 None %    Basophils Automated 0.8 None %    Immature Granulocytes 0.2 None %    Nucleated RBC 0.0 0.0 - 0.0 /100 WBC    Neutrophils Absolute 4.56 1.10 - 6.33 x10 3/uL    Lymphocytes Absolute Automated 1.06 0.42 - 3.22 x10 3/uL    Monocytes Absolute Automated 0.47 0.21 - 0.85 x10 3/uL    Eosinophils Absolute Automated 0.03 0.00 - 0.44 x10 3/uL    Basophils Absolute Automated 0.05 0.00 - 0.08 x10 3/uL    Immature Granulocytes Absolute 0.01 0.00 - 0.07 x10 3/uL    Absolute NRBC 0.00 0.00 - 0.00 x10 3/uL   Comprehensive metabolic panel   Result Value Ref Range    Glucose 87 70 - 100 mg/dL    BUN 15 9 - 28 mg/dL    Creatinine 0.7 0.7 - 1.3 mg/dL    Sodium 045 409 - 811 mEq/L    Potassium 4.7 3.5 - 5.1 mEq/L    Chloride 102 100 - 111 mEq/L    CO2 28 22 - 29 mEq/L    Calcium 9.7 7.9 - 10.2 mg/dL    Protein, Total 7.2 6.0  - 8.3 g/dL    Albumin 4.1 3.5 - 5.0 g/dL    AST (SGOT) 21 5 - 34 U/L    ALT 13 0 - 55 U/L    Alkaline Phosphatase 73 38 - 106 U/L    Bilirubin, Total 0.7 0.2 - 1.2 mg/dL    Globulin 3.1 2.0 - 3.6 g/dL    Albumin/Globulin Ratio 1.3 0.9 - 2.2    Anion Gap 8.0 5.0 - 15.0   GFR   Result Value Ref Range    EGFR >60.0        CRITICAL CARE/PROCEDURES    Procedures    DIAGNOSIS      Diagnosis:  Final diagnoses:   Acute urinary retention   Bladder calculus   Right nephrolithiasis   Abdominal aortic aneurysm (AAA)  without rupture   Hypertension, unspecified type       Disposition:  ED Disposition     ED Disposition Condition Date/Time Comment    Discharge  Fri Dec 28, 2019  5:56 PM [facility] discharge to home/self care.    Condition at disposition: Stable            Prescriptions:  There are no discharge medications for this patient.      This note was generated by the Epic EMR system/ Dragon speech recognition and may contain inherent errors or omissions not intended by the user. Grammatical errors, random word insertions, deletions and pronoun errors  are occasional consequences of this technology due to software limitations. Not all errors are caught or corrected. If there are questions or concerns about the content of this note or information contained within the body of this dictation they should be addressed directly with the author for clarification.     Sheldon Silvan, FNP  12/28/19 1858

## 2019-12-28 NOTE — Telephone Encounter (Signed)
Appt scheduled 01-01-2020@9 :30am, son informed

## 2020-01-01 ENCOUNTER — Ambulatory Visit (INDEPENDENT_AMBULATORY_CARE_PROVIDER_SITE_OTHER): Payer: Medicare Other | Admitting: Urology

## 2020-01-03 ENCOUNTER — Ambulatory Visit: Payer: Medicare Other | Attending: Urology

## 2020-01-03 ENCOUNTER — Other Ambulatory Visit: Payer: Self-pay | Admitting: Urology

## 2020-01-03 DIAGNOSIS — Z01818 Encounter for other preprocedural examination: Secondary | ICD-10-CM

## 2020-01-03 DIAGNOSIS — Z03818 Encounter for observation for suspected exposure to other biological agents ruled out: Secondary | ICD-10-CM | POA: Insufficient documentation

## 2020-01-03 LAB — COVID-19 (SARS-COV-2): SARS CoV 2 Overall Result: NEGATIVE

## 2020-01-04 ENCOUNTER — Encounter
Admission: RE | Disposition: A | Discharge status: Home / Self Care | Payer: Self-pay | Source: Ambulatory Visit | Attending: Urology

## 2020-01-04 ENCOUNTER — Ambulatory Visit: Payer: Medicare Other

## 2020-01-04 ENCOUNTER — Ambulatory Visit
Admission: RE | Admit: 2020-01-04 | Discharge: 2020-01-04 | Disposition: A | Discharge status: Home / Self Care | Payer: Medicare Other | Source: Ambulatory Visit | Attending: Urology | Admitting: Urology

## 2020-01-04 DIAGNOSIS — N210 Calculus in bladder: Secondary | ICD-10-CM | POA: Insufficient documentation

## 2020-01-04 DIAGNOSIS — N200 Calculus of kidney: Secondary | ICD-10-CM | POA: Insufficient documentation

## 2020-01-04 DIAGNOSIS — I714 Abdominal aortic aneurysm, without rupture: Secondary | ICD-10-CM | POA: Insufficient documentation

## 2020-01-04 DIAGNOSIS — F17210 Nicotine dependence, cigarettes, uncomplicated: Secondary | ICD-10-CM | POA: Insufficient documentation

## 2020-01-04 DIAGNOSIS — J439 Emphysema, unspecified: Secondary | ICD-10-CM | POA: Insufficient documentation

## 2020-01-04 SURGERY — CYSTOSCOPY, LITHOLAPAXY
Anesthesia: Anesthesia General | Site: Pelvis | Wound class: Clean Contaminated

## 2020-01-04 MED ORDER — FAMOTIDINE 10 MG/ML IV SOLN (WRAP)
INTRAVENOUS | Status: DC | PRN
Start: 2020-01-04 — End: 2020-01-04
  Administered 2020-01-04: 20 mg via INTRAVENOUS

## 2020-01-04 MED ORDER — LABETALOL HCL 5 MG/ML IV SOLN (WRAP)
INTRAVENOUS | Status: AC
Start: 2020-01-04 — End: ?
  Filled 2020-01-04: qty 4

## 2020-01-04 MED ORDER — CEFTRIAXONE SODIUM 1 G IJ SOLR
INTRAMUSCULAR | Status: DC
Start: 2020-01-04 — End: 2020-01-04
  Filled 2020-01-04: qty 1000

## 2020-01-04 MED ORDER — GLYCOPYRROLATE 0.2 MG/ML IJ SOLN (WRAP)
INTRAMUSCULAR | Status: AC
Start: 2020-01-04 — End: ?
  Filled 2020-01-04: qty 1

## 2020-01-04 MED ORDER — EPHEDRINE SULFATE 50 MG/ML IJ/IV SOLN (WRAP)
Status: DC | PRN
Start: 2020-01-04 — End: 2020-01-04
  Administered 2020-01-04: 10 mg via INTRAVENOUS

## 2020-01-04 MED ORDER — PHENYLEPHRINE 100 MCG/ML IV SOSY (WRAP)
PREFILLED_SYRINGE | INTRAVENOUS | Status: AC
Start: 2020-01-04 — End: ?
  Filled 2020-01-04: qty 10

## 2020-01-04 MED ORDER — CEFTRIAXONE SODIUM 1 G IJ SOLR
1.0000 g | Freq: Once | INTRAMUSCULAR | Status: AC
Start: 2020-01-04 — End: 2020-01-04
  Administered 2020-01-04: 17:00:00 1 g via INTRAVENOUS

## 2020-01-04 MED ORDER — PROPOFOL 10 MG/ML IV EMUL (WRAP)
INTRAVENOUS | Status: DC | PRN
Start: 2020-01-04 — End: 2020-01-04
  Administered 2020-01-04: 70 mg via INTRAVENOUS
  Administered 2020-01-04 (×3): 30 mg via INTRAVENOUS

## 2020-01-04 MED ORDER — LIDOCAINE HCL (PF) 2 % IJ SOLN
INTRAMUSCULAR | Status: AC
Start: 2020-01-04 — End: ?
  Filled 2020-01-04: qty 5

## 2020-01-04 MED ORDER — PHENAZOPYRIDINE HCL 200 MG PO TABS
200.0000 mg | ORAL_TABLET | Freq: Once | ORAL | Status: AC
Start: 2020-01-04 — End: 2020-01-04

## 2020-01-04 MED ORDER — OXYBUTYNIN CHLORIDE 5 MG PO TABS
5.0000 mg | ORAL_TABLET | Freq: Once | ORAL | Status: AC
Start: 2020-01-04 — End: 2020-01-04

## 2020-01-04 MED ORDER — PHENYLEPHRINE 100 MCG/ML IV SOSY (WRAP)
PREFILLED_SYRINGE | INTRAVENOUS | Status: DC | PRN
Start: 2020-01-04 — End: 2020-01-04
  Administered 2020-01-04: 100 ug via INTRAVENOUS

## 2020-01-04 MED ORDER — ONDANSETRON HCL 4 MG/2ML IJ SOLN
INTRAMUSCULAR | Status: AC
Start: 2020-01-04 — End: ?
  Filled 2020-01-04: qty 2

## 2020-01-04 MED ORDER — PHENAZOPYRIDINE HCL 200 MG PO TABS
200.0000 mg | ORAL_TABLET | Freq: Three times a day (TID) | ORAL | 0 refills | Status: DC | PRN
Start: 2020-01-04 — End: 2022-09-07

## 2020-01-04 MED ORDER — FENTANYL CITRATE (PF) 50 MCG/ML IJ SOLN (WRAP)
INTRAMUSCULAR | Status: AC
Start: 2020-01-04 — End: ?
  Filled 2020-01-04: qty 2

## 2020-01-04 MED ORDER — FENTANYL CITRATE (PF) 50 MCG/ML IJ SOLN (WRAP)
INTRAMUSCULAR | Status: DC | PRN
Start: 2020-01-04 — End: 2020-01-04
  Administered 2020-01-04 (×4): 25 ug via INTRAVENOUS

## 2020-01-04 MED ORDER — ONDANSETRON HCL 4 MG/2ML IJ SOLN
INTRAMUSCULAR | Status: DC | PRN
Start: 2020-01-04 — End: 2020-01-04
  Administered 2020-01-04: 4 mg via INTRAVENOUS

## 2020-01-04 MED ORDER — LACTATED RINGERS IV SOLN
INTRAVENOUS | Status: DC | PRN
Start: 2020-01-04 — End: 2020-01-04

## 2020-01-04 MED ORDER — ESMOLOL HCL 100 MG/10ML IV SOLN
INTRAVENOUS | Status: AC
Start: 2020-01-04 — End: ?
  Filled 2020-01-04: qty 10

## 2020-01-04 MED ORDER — OXYBUTYNIN CHLORIDE 5 MG PO TABS
ORAL_TABLET | ORAL | Status: AC
Start: 2020-01-04 — End: 2020-01-04
  Administered 2020-01-04: 5 mg via ORAL
  Filled 2020-01-04: qty 1

## 2020-01-04 MED ORDER — OXYCODONE HCL 5 MG PO TABS
5.0000 mg | ORAL_TABLET | ORAL | 0 refills | Status: DC | PRN
Start: 2020-01-04 — End: 2022-05-07

## 2020-01-04 MED ORDER — LIDOCAINE HCL 2 % IJ SOLN
INTRAMUSCULAR | Status: DC | PRN
Start: 2020-01-04 — End: 2020-01-04
  Administered 2020-01-04: 40 mg via INTRAVENOUS

## 2020-01-04 MED ORDER — LABETALOL HCL 5 MG/ML IV SOLN (WRAP)
INTRAVENOUS | Status: DC | PRN
Start: 2020-01-04 — End: 2020-01-04
  Administered 2020-01-04: 2 mg via INTRAVENOUS

## 2020-01-04 MED ORDER — ESMOLOL HCL 100 MG/10ML IV SOLN
INTRAVENOUS | Status: DC | PRN
Start: 2020-01-04 — End: 2020-01-04
  Administered 2020-01-04 (×2): 10 mg via INTRAVENOUS

## 2020-01-04 MED ORDER — FAMOTIDINE 20 MG/2ML IV SOLN
INTRAVENOUS | Status: AC
Start: 2020-01-04 — End: ?
  Filled 2020-01-04: qty 2

## 2020-01-04 MED ORDER — PROPOFOL 10 MG/ML IV EMUL (WRAP)
INTRAVENOUS | Status: AC
Start: 2020-01-04 — End: ?
  Filled 2020-01-04: qty 20

## 2020-01-04 MED ORDER — SODIUM CHLORIDE 0.9% BAG (IRRIGATION USE)
INTRAVENOUS | Status: DC | PRN
Start: 2020-01-04 — End: 2020-01-04
  Administered 2020-01-04 (×4): 6000 mL

## 2020-01-04 MED ORDER — PHENAZOPYRIDINE HCL 200 MG PO TABS
ORAL_TABLET | ORAL | Status: AC
Start: 2020-01-04 — End: 2020-01-04
  Administered 2020-01-04: 18:00:00 200 mg via ORAL
  Filled 2020-01-04: qty 1

## 2020-01-04 SURGICAL SUPPLY — 33 items
CATH FOLEY COUDE 5CC 20F (Catheter Urine) ×1
CATHETER URETHRAL OD20 FR 5 CC FOLEY 2 (Catheter Urine) ×1
CATHETER URETHRAL OD20 FR 5 CC FOLEY 2 WAY 1 DRAINAGE EYE MEDIUM OLIVE (Catheter Urine) IMPLANT
CATHETER URTH BACTI-GRD RBR DDRGL 5CC (Catheter Urine) ×1
EVACUATOR BLADDER COMPLETE STERILE LATEX DISPOSABLE ELLIK SURGICAL (Catheter Miscellaneous) IMPLANT
EVACUATOR ELLIK BLADDER (Catheter Micellaneous)
EVACUATOR SRG ELLIK LTX STRL CMPLT BLDR (Catheter Micellaneous)
FIBER FLEXIVA HI-PWR LASER 550 (Laser Supplies)
FIBER LASER 550 UM HIGH POWER POLISH (Laser Supplies)
FIBER LASER 550 UM HP POLISH OUTPUT TIP NONTAPER GD FLEXIVA FLEXSHIELD (Laser Supplies) IMPLANT
FIBER LASER 80HZ 120W 6J 550 UM MOSES (Laser Supplies) ×1
FIBER LASER 80HZ 120W 6J 550 UM MOSES D/F/L LITHOTRIPSY (Laser Supplies) IMPLANT
FIBER LASER LITHO MOSES 550 (Laser Supplies) ×1
FIBER LSR 80HZ 120W 6J 550UM MOSES D/F/L (Laser Supplies) ×1
FIBER LSR FLEXSHIELD 550UM FLEXIVA LF HI (Laser Supplies)
GLOVE 7.5 PI ULTRA TOUCH (Glove) ×1
GLOVE SRG PLISPRN 7.5 BGL PI ULTRATOUCH (Glove) ×1
GLOVE SURGICAL 7 1/2 BIOGEL PI (Glove) ×1
GLOVE SURGICAL 7 1/2 BIOGEL PI ULTRATOUCH G POWDER FREE ROUGH BEAD (Glove) ×1 IMPLANT
SET IRR 300- MMHG LVL 1 NORMOFLO LF STRL (Urology Supply) ×1
SET IRRIGATION 300- MMHG AUTOMATIC GAS (Urology Supply) ×1
SET IRRIGATION 300- MMHG AUTOMATIC GAS VENT 2 SPIKE SETUP (Urology Supply) ×1 IMPLANT
SET SRGBSN LF STRL TRNSF DISP (Kits) ×1
SET SURGICAL BASIN TRANSFER (Kits) ×1
SET SURGICAL BASIN TRANSFER MEDLINE INDUSTRIES, INC. (Kits) ×1 IMPLANT
SET TRANSFER BASIN (Kits) ×1
SOL NACL 0.9% IRR 3000CC (Irrigation Solutions) ×2
SOLUTION IRR 0.9% NACL 3L URMTC LF PLS (Irrigation Solutions) ×4
SOLUTION IRRIGATION 0.9% SODIUM CHLORIDE 3000 ML PLASTIC CONTAINER (Irrigation Solutions) ×2 IMPLANT
TRAY CYSTOSCOPY FFX (Pack) ×1
TRAY SRG CYSTOSCOPY IFMC (Pack) ×2
TRAY SURGICAL CYSTOSCOPY (Pack) ×1 IMPLANT
TUBES WARMING CYST IRRIGATION (Urology Supply) ×1

## 2020-01-04 NOTE — H&P (Signed)
ADMISSION HISTORY AND PHYSICAL EXAM    Date Time: 01/04/20 12:23 PM  Patient Name: [name] [name],[name] [name]  Attending Physician: Miquel Dunn, MD    Assessment:   Bladder Stone    Plan:   To OR for Cystolitholapaxy    History of Present Illness:   [name] [name] is a 69 y.o. male who presents to the hospital with a recent bout of urinary retention.  CT showed a 2.3cm bladder stone.  [name] [name] presents for cystolitholapaxy.    Past Medical History:     Past Medical History:   Diagnosis Date    Collapsed lung     Emphysema lung     Enlarged prostate        Past Surgical History:     Past Surgical History:   Procedure Laterality Date    PROSTATE SURGERY         Family History:   No family history on file.    Social History:     Social History     Socioeconomic History    Marital status: Unknown     Spouse name: Not on file    Number of children: Not on file    Years of education: Not on file    Highest education level: Not on file   Occupational History    Not on file   Social Needs    Financial resource strain: Not on file    Food insecurity     Worry: Not on file     Inability: Not on file    Transportation needs     Medical: Not on file     Non-medical: Not on file   Tobacco Use    Smoking status: Current Every Day Smoker     Years: 50.00     Types: Cigarettes    Smokeless tobacco: Never Used   Substance and Sexual Activity    Alcohol use: Never     Frequency: Never    Drug use: Never    Sexual activity: Not on file   Lifestyle    Physical activity     Days per week: Not on file     Minutes per session: Not on file    Stress: Not on file   Relationships    Social connections     Talks on phone: Not on file     Gets together: Not on file     Attends religious service: Not on file     Active member of club or organization: Not on file     Attends meetings of clubs or organizations: Not on file     Relationship status: Not on file    Intimate partner violence     Fear of current or ex partner: Not on file      Emotionally abused: Not on file     Physically abused: Not on file     Forced sexual activity: Not on file   Other Topics Concern    Not on file   Social History Narrative    Not on file       Allergies:   No Known Allergies    Medications:     Medications Prior to Admission   Medication Sig    cefdinir (OMNICEF) 300 MG capsule Take 1 capsule (300 mg total) by mouth 2 (two) times daily for 7 days       Review of Systems:   A comprehensive review of systems was: Negative    Physical Exam:  There were no vitals filed for this visit.    Intake and Output Summary (Last 24 hours) at Date Time  No intake or output data in the 24 hours ending 01/04/20 1223    AFVSS  NAD, A/O x 3  RRR  CTAB  Abdomen benign    Labs:     Results     ** No results found for the last 24 hours. **          Recent CBC No results for input(s): RBC, HGB, HCT, MCV, MCH, MCHC, RDW, MPV, LABPLAT in the last 24 hours.    Invalid input(s): WHITEBLOODCE,  NRBCA,  REFLX,  ANRBA  Recent BMP No results for input(s): GLU, BUN, CREAT, CA, NA, K, CL, CO2 in the last 24 hours.    Invalid input(s): AGAP  Recent CMP No results for input(s): GLU, BUN, CREAT, NA, CK, CO2, CA, ALB, AST, ALT, ALP, GLOB in the last 24 hours.    Invalid input(s):  CL, TP, BILIT, AG,  AGAP  Recent URINALYSIS Invalid input(s): UMACB, UTYP, UCOLR, UAPP, USPG, UPH, ULEU, UNIT, UPRO, UGLU, UKET, UURO, UBIL, UBLD, UTYPP  Recent BLOOD CULTURE No results for input(s): CXBLD in the last 24 hours.  Recent URINE CULTURE Invalid input(s): CXURN  Recent PT/PTT No results for input(s): INR, PTT in the last 24 hours.    Invalid input(s): PTI, COUM, COUMP, ACOAG, ACOAP  Recent PT/INR No results for input(s): INR in the last 24 hours.    Invalid input(s): PTI, COUM, COUMP    Rads:   CT ABDOMEN AND PELVIS WITHOUT CONTRAST    CLINICAL STATEMENT:  nephrolithiasis    COMPARISON: No prior studies are available for comparison.     TECHNIQUE: Helical axial imaging from above the domes of  diaphragm  through the pubic symphysis was performed without the administration of  intravenous contrast and without  intraluminal bowel contrast. Sagittal  and coronal reconstructions were obtained.     Note that CT scanning at this site utilizes multiple dose reduction  techniques including automatic exposure control, adjustment of the MAS  and/or KVP according to patient size, and use of iterative  reconstruction technique.    FINDINGS: There is severe centrilobular emphysema of the lung bases.    ABDOMEN AND PELVIS:    The study is performed without intravenous contrast therefore evaluation  of the solid parenchymal organs and vascular structures is limited.     Hepatobiliary: The unenhanced liver grossly unremarkable appearance.     Gallbladder: The gallbladder is unremarkable in appearance.      Spleen: The unenhanced spleen is unremarkable in appearance.    Pancreas: Limited evaluation of the unenhanced pancreas is unremarkable.    Adrenal glands: Unremarkable.     Kidneys, ureters and urinary bladder:   Right kidney: 10 mm nonobstructive calculus is identified of the upper  pole the right kidney. A few punctate calculi are also identified of the  right kidney. No acute abnormality can be visualized of the unenhanced  kidney. No obstructive calculus can be visualized. There is no  hydronephrosis. No significant perinephric stranding.     Left kidney: No acute abnormality can be visualized of the unenhanced  kidney. No obstructive calculus can be visualized. There is no  hydronephrosis. No significant perinephric stranding.    Urinary bladder: A ovoid 2.3 cm calculus is identified dependently  within the urinary bladder.  Mild trabeculation of the bladder wall. The  median lobe of the prostate indents the posterior  bladder wall.    Lymph Nodes: No abdominal or pelvic lymphadenopathy.    Hollow viscus:    The study is performed without intraluminal bowel contrast therefore  evaluation of the  hollow viscus and the intra-abdominal structures is  limited. The large and small bowel are normal in caliber. The appendix  cannot be visualized.     Peritoneal cavity: No ascites is present. There is no free air.    Vasculature: Moderate atherosclerotic calcification and tortuosity is  noted of the abdominal aorta and its branches. There is fusiform  aneurysmal dilatation of the proximal abdominal aorta which measures 3.3  cm in AP dimension.     Miscellaneous pelvis: The prostate is lobular, inhomogeneous and  enlarged indenting the posterior bladder wall.     Musculoskeletal: There is a mild lumbar sclerotic curvature with  convexity to the left.     IMPRESSION:     1. Nonobstructive right renal calculi. Large bladder calculus.  2. Aneurysmal dilatation of the abdominal aorta which measures 3.3 cm in  diameter.     Fonnie Mu, DO   12/28/2019 2:49 PM    Signed by: Miquel Dunn

## 2020-01-04 NOTE — Anesthesia Preprocedure Evaluation (Signed)
Anesthesia Evaluation    AIRWAY    Mallampati: II    TM distance: >3 FB  Neck ROM: full  Mouth Opening:full  Planned to use difficult airway equipment: No CARDIOVASCULAR    cardiovascular exam normal       DENTAL           PULMONARY    pulmonary exam normal     OTHER FINDINGS                  Relevant Problems   GU/RENAL   (+) Right kidney stone               Anesthesia Plan    ASA 3     general                     intravenous induction   Detailed anesthesia plan: general LMA      Post Op: plan for postoperative opioid use    Post op pain management: per surgeon    informed consent obtained    Plan discussed with CRNA.      pertinent labs reviewed             Signed by: Cyril Loosen 01/04/20 1:16 PM

## 2020-01-04 NOTE — Anesthesia Postprocedure Evaluation (Signed)
Anesthesia Post Evaluation    Patient: [facility]    Procedure(s) with comments:  CYSTOSCOPY, LITHOLAPAXY - CYSTOLITHOLAPAXY    Anesthesia type: general    Last Vitals:   Vitals Value Taken Time   BP 153/86 01/04/20 1820   Temp 36.4 C (97.5 F) 01/04/20 1830   Pulse 79 01/04/20 1830   Resp 17 01/04/20 1830   SpO2 95 % 01/04/20 1830       Anesthesia Post Evaluation:     Patient Evaluated: PACU    Level of Consciousness: awake and alert    Pain Management: adequate    Airway Patency: patent    Anesthetic complications: No      PONV Status: none    Cardiovascular status: acceptable  Respiratory status: acceptable  Hydration status: acceptable        Not Complete    Signed by: Cyril Loosen, 01/04/2020 8:28 PM

## 2020-01-04 NOTE — Discharge Instr - AVS First Page (Addendum)
Understanding Bladder Stones  Bladder stones are small deposits of crystals made from minerals and proteins that form in the urine. They may form when a small amount of urine stays in your bladder after urinating. Urine contains minerals such as calcium oxalate and uric acid that can build up and harden, forming stones.   Bladder stones can vary in size and shape. They are more likely to occur in men.     What causes bladder stones?  An enlarged prostate in men is the most common cause of bladder stones. The prostate is a small gland located near the urethra. The urethra is the tube that empties urine from the body. As men age, the prostate tends to become larger and it pushes against the urethra. That may cause the bladder to not empty completely.   Other possible causes of bladder stones are:   Diet problems, such as a lack of water (dehydration)   Urinary tract infection   Pelvic organ prolapse in women   Injury to the spinal cord that affects how you urinate   Bladder surgery   Foreign object in the bladder, such as sutures from a medical procedure or a catheter  Symptoms of bladder stones  Some bladder stones cause no problems. But if they do, you may have these symptoms:   Sudden urge to urinate   Need to urinate more often   Unable to hold your urine (incontinence)   Pain while urinating   Blood in your urine   Stomach pain  Treatment for bladder stones  In some cases, you may not need any treatment for a bladder stone. It may eventually pass out of your body when you use the toilet. For larger stones, treatment may include:    Changes in diet. Drinking more water each day may help the stone leave your body. You may feel some pain when the stone passes. You may also need to cut back on salt and fat to stop more stones from forming.   Surgery. Several different procedures are available to treat bladder stones. Some stones may be removed through the urethra with a small tube with a  camera on it (cystoscope). A healthcare provider may use the scope to break up the stone with lasers or shock waves. Large stones may be removed through a cut near the pelvis.  Possible complications of bladder stones   More bladder stones   Urinary tract infection   Damage to the bladder or urethra   Inability to urinate    When to call your healthcare provider  Call your healthcare provider right away if you have any of these:    Fever of 100.28F (38C) or higher, or as directed by your healthcare provider   Redness, swelling, or fluid leaking from your incision that gets worse   Pain that gets worse   Blood in the urine   Cant urinate   Symptoms that dont get better, or get worse   New symptoms  StayWell last reviewed this educational content on 05/30/2019   2000-2020 [employer], [place]. [address] [place], [place] [address]. All rights reserved. This information is not intended as a substitute for professional medical care. Always follow your healthcare professional's instructions.      Discharge Instructions: After Your Surgery  Youve just had surgery. During surgery, you were given medicine called anesthesia to keep you relaxed and free of pain. After surgery, you may have some pain or nausea. This is common. Here  are some tips for feeling better and getting well after surgery.     Stay on schedule with your medicine.   Going home  Your healthcare provider will show you how to take care of yourself when you go home. He or she will also answer your questions. Have an adult family member or friend drive you home. For the first 24 hours after your surgery:   Don't drive or use heavy equipment.   Don't make important decisions or sign legal papers.   Don't drink alcohol.   Have someone stay with you, if needed. He or she can watch for problems and help keep you safe.  Be sure to go to all follow-up visits with your healthcare provider. And rest after your surgery for as long as your  healthcare provider tells you to.  Coping with pain  If you have pain after surgery, pain medicine will help you feel better. Take it as told, before pain becomes severe. Also, ask your healthcare provider or pharmacist about other ways to control pain. This might be with heat, ice, or relaxation. And follow any other instructions your surgeon or nurse gives you.  Tips for taking pain medicine  To get the best relief possible, remember these points:   Pain medicines can upset your stomach. Taking them with a little food may help.   Most pain relievers taken by mouth need at least 20 to 30 minutes to start to work.   Don't wait till your pain becomes severe before you take your medicine. Try to time your medicine so that you can take it before starting an activity. This might be before you get dressed, go for a walk, or sit down for dinner.   Constipation is a common side effect of pain medicines. Call your healthcare provider before taking any medicines such as laxatives or stool softeners to help ease constipation. Also ask if you should skip any foods. Drinkinglots of fluids andeating foodssuch as fruits and vegetables that are high in fiber can also help. Remember, don't take laxatives unless your surgeon has prescribed them.   Drinking alcohol and taking pain medicine can cause dizziness and slow your breathing. It can even be deadly. Don't drink alcohol while taking pain medicine.   Pain medicine can make you react more slowly to things. Don't drive or run machinery while taking pain medicine.  Your healthcare providermay tell you to take acetaminophen to help ease your pain. Ask him or her how much you are supposed to take each day. Acetaminophen or other pain relievers may interact with your prescription medicines or other over-the-counter (OTC) medicines. Some prescription medicines have acetaminophen and other ingredients.Using both prescription and OTC acetaminophenfor paincan cause you to  overdose. Readthe labels on your OTC medicineswith care. This will help youto clearly know the list of ingredients, how much to take, and anywarnings. It may also help you not take too muchacetaminophen.If you have questions or don't understand the information, ask your pharmacist or healthcare provider to explain it to you before you take the OTC medicine.  Managing nausea  Some people have an upset stomach after surgery. This is often because of anesthesia, pain, or pain medicine, or the stress of surgery. These tips will help you handle nausea and eat healthy foods as you get better. If you were on a special food plan before surgery, ask your healthcare provider if you should follow it while you get better. These tips may help:   Don't push yourself  to eat. Your body will tell you when to eat and how much.   Start off with clear liquids and soup. They are easier to digest.   Next try semi-solid foods, such as mashed potatoes, applesauce, and gelatin, as you feel ready.   Slowly move to solid foods. Dont eat fatty, rich, or spicy foods at first.   Don't force yourself to have 3 large meals a day. Instead eat smaller amounts more often.   Take pain medicines with a small amount of solid food, such as crackers or toast, to prevent nausea.  When to call your healthcare provider  Call your healthcare provider if:   You still have intolerable pain an hour after taking medicine. The medicine may not be strong enough.   You feel too sleepy, dizzy, or groggy. The medicine may be too strong.   You have side effects such as nausea or vomiting, or skin changes such as rash, itching, or hives.Your healthcare provider may suggest other medicines to control side effects.  Rash, itching, or hives may mean you have an allergic reaction. Report this right away. If you have trouble breathing or facial swelling, call 911 right away.  If you have obstructive sleep apnea  You were given anesthesia medicine during  surgery to keep you comfortable and free of pain. After surgery, you may have more apnea spells because of this medicine and other medicines you were given. The spells may last longer than usual.  At home:   Keep using the continuous positive airway pressure (CPAP) device when you sleep. Unless your healthcare provider tells you not to, use it when you sleep, day or night. CPAP is a common device used to treat obstructive sleep apnea.   Talk with your provider before taking any pain medicine, muscle relaxants, or sedatives. Your provider will tell you about the possible dangers of taking these medicines.  StayWell last reviewed this educational content on 01/27/2018   2000-2020 [employer], [place]. [address] [place], [place] [address]. All rights reserved. This information is not intended as a substitute for professional medical care. Always follow your healthcare professional's instructions.      Gy m (Anesthesia): Sau ca ph?u thu?t c?a qu v?  Qu v? v?a ???c ph?u thu?t. Trong khi ph?u thu?t, m?t bi?n php y t? ???c ti?n hnh ??i v?i qu v? g?i l gy m (anesthesia) nh?m gip qu v? c?m th?y tho?i mi v khng ?au. Sau khi ph?u thu?t, qu v? c th? c?m th?y h?i ?au ho?c bu?n nn. ?y l ?i?u bnh th??ng. Sau ?y l m?t s? m?o gip qu v? c?m th?y t?t h?n v h?i ph?c sau ph?u thu?t.     Stay on schedule with your medication.   V? nh  Bc s? ho?c y t c?a qu v? s? h??ng d?n qu v? cch t? ch?m sc b?n thn khi v? nh. H? c?ng s? tr? l?i cc cu h?i c?a qu v?. Nh? m?t ng??i thn ho?c b?n ? tr??ng thnh ch? qu v? v? nh. Trong 24 gi? ??u tin sau ca ph?u thu?t c?a qu v?:   Khng ???c li xe hay s? d?ng thi?t b? n?ng.   Khng ???c ??a ra cc quy?t ??nh quan tr?ng hay k k?t ti li?u php l.   Trnh u?ng r??u.   C ai ? ? nh cng qu v? n?u c?n. H? c th? quan st xem c v?n ?? g khng v gip ??m b?o qu v? ???c  an ton.  Nh? ph?i ??n ??y ?? cc bu?i h?n g?p bc s? sau ?Seth Bake ph?i ngh?  ng?i sau khi ph?u thu?t cho t?i khi no bc s? b?o thi.  ?ng ph v?i c?n ?au  N?u qu v? th?y ?au sau khi ph?u thu?t, thu?c gi?m ?au s? gip qu v? th?y t?t h?n. U?ng thu?c theo ch? d?n tr??c khi c?n ?au tr? nn nghim tr?ng. ??ng th?i hy h?i bc s? ho?c d??c s? v? cc cch khc ?? ki?m sot c?n ?au, ch?ng h?n nh? dng nhi?t, ? hay xoa bp. Qu v? c?ng ph?i nh? lm theo m?i h??ng d?n khc m bc s? ph?u thu?t ho?c y t c?a qu v? ??a ra.  Cc m?o khi u?ng thu?c gi?m ?au  ?? ??t hi?u qu? gi?m ?au t?t nh?t c th?, hy nh? cc ?i?m sau:   Thu?c gi?m ?au c th? khi?n d? dy c?a qu v? kh ch?u. U?ng thu?c cng t th?c ?n c th? gip ch.   H?u h?t cc lo?i thu?c gi?m ?au dng qua ???ng mi?ng c?n t nh?t 20 ??n 30 pht m?i c tc d?ng.   U?ng thu?c theo l?ch c th? gip qu v? nh? khi no ph?i u?ng. C? g?ng b? tr l?ch u?ng thu?c sao cho qu v? c th? u?ng thu?c tr??c khi b?t ??u m?t ho?t ??ng nh? m?c qu?n o, ?i b? hay ng?i xu?ng ?n t?i.   To bn l m?t trong cc tc d?ng ph? th??ng th?y khi dng thu?c gi?m ?au. Hy lin h? bc s? c?a qu v? tr??c khi dng b?t k? bi?n php y t? no nh? thu?c nhu?n trng hay thu?c ch?ng to bn ?? gip gi?m to bn. ??ng th?i, hy h?i v? cc h?n ch? trong ch? ?? ?n, b?i vi?c u?ng nhi?u n??c v ?n nhi?u th?c ?n nh? rau qu? c l??ng ch?t s? cao c?ng c th? gip ch. Nh? r?ng khng ???c dng thu?c nhu?n trng tr? khi bc s? ph?u thu?t c?a qu v? k ??n chng.   Tr?n l?n r??u v?i thu?c gi?m ?au c th? khi?n qu v? chng m?t v lm ch?m nh?p th?i c?a qu v?. Th?m ch n c th? gy ch?t ng??i. Khng ???c u?ng r??u trong khi ?ang dng thu?c gi?m ?au.   Thu?c gi?m ?au c th? lm ch?m kh? n?ng ph?n x? c?a qu v?. Khng ???c li xe hay v?n hnh my mc trong khi ?ang dng thu?c gi?m ?au.  N?u nhn vin y t? c?a qu v? [place] qu v? u?ng acetaminophen, tn th??ng g?i c?a Tylenol v cc lo?i thu?c gi?m ?au chnh hi?u khc, ?? gip gi?m c?n ?au c?a qu v?, hy h?i xin li?u u?ng hng ngy.  Nh? r?ng acetaminophen v cc lo?i thu?c gi?m ?au khc c th? ph?n ?ng v?i cc lo?i thu?c k ??n ho?c thu?c bn qua qu?y (over-the-counter, OTC) khc. FDA khuy?n co hy ??c k? nhn thu?c OTC ?? hi?u r danh sch cc thnh ph?n ho?t tnh, ch? d?n v bi?n php phng ng?a ?? gip qu v? trnh u?ng qu li?u acetaminophen. N?u qu v? c cu h?i, hy h?i d??c s? ho?c nhn vin y t? c?a mnh.  Ki?m sot c?n bu?n nn  M?t s? ng??i c?m th?y b?ng bu?n nn sau khi ph?u thu?t. Nguyn nhn th??ng l do gy m, ?au, thu?c gi?m ?au ho?c stress sau ph?u thu?t. Cc m?o sau s? gip qu v? ki?m sot c?n bu?n nn v h?p th? ??  dinh d??ng khi h?i ph?c. N?u tr??c khi ph?u thu?t, qu v? ?ang trong ch? ?? ?n king ??c bi?t, hy h?i bc s? xem li?u qu v? c nn tun th? ch? ?? ? trong khi ph?c h?i hay khng. Cc m?o ny c th? gip ch:   ??ng p b?n thn ?n. C? th? c?a qu v? s? b?o cho qu v? bi?t ph?i ?n g v ?n khi no.   B?t ??u v?i t n??c canh khng v sp. Chng l cc ?? d? tiu ha nh?t.   Sau ? chuy?n sang ?? ??c v?a (khoai ty nghi?n, n??c s?t to v gelatin) khi qu v? c?m th?y s?n sng.   Ch?m ch?m chuy?n sang th?c ?n ??c. ??ng ?n ?? ?n giu ch?t bo, nhi?u dinh d??ng ho?c ??c v? tr??c.   ??ng p b?n thn ?n ba b?a chnh trong m?t ngy. Thay vo ?, hy ?n thnh nhi?u b?a nh? h?n.   Dng thu?c gi?m ?au cng m?t l??ng nh? th?c ?n ??c, ch?ng h?n nh? bnh quy hay bnh m n??ng ?? trnh bu?n nn.  G?i cho bc s? ph?u thu?t c?a qu v? n?u...   Qu v? v?n c?m th?y ?au sau m?t gi? k? t? khi dng thu?c (c th? ch?a ?? li?u).   Qu v? c?ng c?m th?y bu?n ng?, chng m?t ho?c ch?nh chong (c th? do qu li?u).   Qu v? g?p cc tc d?ng ph? nh? bu?n nn, nn m?a ho?c thay ??i da (pht ban, ng?a ho?c m?n ??).    2000-2020 [employer], [place]. [address] [place], [place] [address]. T?t c? cc quy?n ???c b?o l?u. Thng tin ny khng nh?m thay th? cho d?ch v? ch?m sc y t? mang tnh chuyn mn. C?n lun tun theo s?  ch? d?n t? chuyn gia ch?m sc s?c kho? c?a qu v?.

## 2020-01-04 NOTE — Brief Op Note (Signed)
BRIEF OP NOTE    Date Time: 01/04/20 5:44 PM    Patient Name:   [name] [name]    Date of Operation:   01/04/2020    Providers Performing:   Surgeon(s):  Miquel Dunn, MD    Assistant (s):   Circulator: Ivor Messier, RN  Laser Nurse: Lubertha Sayres, RN  Scrub Person: Danne Harbor    Operative Procedure:   Procedure(s):  CYSTOSCOPY, [facility]    Preoperative Diagnosis:   Pre-Op Diagnosis Codes:     * Bladder stone [N21.0]    Postoperative Diagnosis:   Post-Op Diagnosis Codes:     * Bladder stone [N21.0]    Anesthesia:   General    Estimated Blood Loss:    * No values recorded between 01/04/2020  4:20 PM and 01/04/2020  5:44 PM *    Implants:   * No implants in log *    Drains:   Drains: 72F coude to leg bag    Specimens:   * No specimens in log *     SPECIMENS (last 24 hours)      Pathology Specimens     Row Name 01/04/20 1600                Specimen Information    Specimen Testing Required  Routine Pathology       Specimen ID   A       Specimen Description  bladder stone             Findings:   As dictated    Complications:   None      Signed by: Miquel Dunn, MD                                                                           [place] [facility]

## 2020-01-04 NOTE — Transfer of Care (Signed)
Anesthesia Transfer of Care Note    Patient: [name] [name]    Procedures performed: Procedure(s) with comments:  CYSTOSCOPY, LITHOLAPAXY - CYSTOLITHOLAPAXY    Anesthesia type: General LMA    Patient location:Phase I PACU    Last vitals:   Vitals:    01/04/20 1759   BP: 153/89   Pulse: 86   Resp: 20   Temp: 36.2 C (97.2 F)   SpO2: 98%       Post pain: Patient not complaining of pain, continue current therapy      Mental Status:awake    Respiratory Function: tolerating room air    Cardiovascular: stable    Nausea/Vomiting: patient not complaining of nausea or vomiting    Hydration Status: adequate    Post assessment: no apparent anesthetic complications and no reportable events    Signed by: Alycia Patten  01/04/20 6:00 PM

## 2020-01-05 NOTE — Op Note (Signed)
Procedure Date: 01/04/2020     Patient Type: A     SURGEON: Miquel Dunn MD  ASSISTANT:       PREOPERATIVE DIAGNOSIS:  Bladder stone.     POSTOPERATIVE DIAGNOSIS:  Bladder stone.     TITLE OF PROCEDURE:  1.  Cystourethroscopy.  2.  Cystolitholapaxy greater than 2.5 cm.     ANESTHESIA:  General.     INTRAVENOUS FLUIDS:  Crystalloid.     ESTIMATED BLOOD LOSS:  Minimal.     SPECIMENS:  Bladder stone for analysis.     DRAINS:  A 20-French coude catheter to leg bag.     COMPLICATIONS:  None.     INDICATIONS FOR PROCEDURE:  [name] [name] is a 69 year old male with a past medical history significant for  BPH status post prostate surgery many years ago.  [name] [name] was seen and  evaluated with urinary retention.  A CT scan showed a bladder stone  estimated at 2.3 cm.  Due to the size of stone, the recommendation was made  to proceed with the above procedure.  The risks, details, benefits were  discussed and all questions answered.  A conscious decision was made to  proceed.     DESCRIPTION OF PROCEDURE:  After the proper operative and anesthesia consents were obtained, [name] [name]  was taken to the operating room to undergo the above procedure.  The  patient was placed on the operating table in supine position.  SCDs were  placed on both lower extremities for DVT prophylaxis.  The patient was  given 1 gram of Rocephin perioperatively.  General anesthesia was induced,  LMA was placed.  The patient was placed in dorsal lithotomy position with  all pressure points padded.  The genitalia were prepped and draped in  standard sterile fashion.  A surgical pause was performed.  Once the team  was in agreement, a 22-French 30-degree rigid cystoscope was passed in  bladder without difficulty.  The anterior urethra was normal in length and  caliber with no evidence of stricture or lesion.  The prostatic urethra  showed obstructing lateral lobes and a slight median bar.  Upon entering  the bladder, pancystoscopy was performed showing both the left  and right  ureteral orifices in orthotopic position.  There was no evidence of bladder  tumors.  There was mild diffuse trabeculation with erythematous changes  consistent with irritation.  At the base of the bladder  there was large  bladder stones seen that was actually measuring closer to 3 cm.  There were  no other stones seen.  At this point, an attempt was made to pass a  26-French continuous-flow resectoscope into the urethra.  However, the  urethral meatus would not accommodate this.  Sissy Hoff sounds were used to  dilate from a 22-French to 30-French.  At this point, the scope would  remove approximately halfway down the urethra, but the caliber of the scope  was too large.  At this point, the laser cystoscope set up was obtained  with a 30-degree lens with continuous flow output and this was passed into  the bladder without difficulty.  The obturator was exchanged for a laser  bridge.  With  continuous-flow on a 550 micron holmium laser fiber was  passed through the scope and extensive lithotripsy was performed until the  stone had been dusted.  An Ellik evacuator was used to remove the stone  fragments, which were sent for analysis.  Repeat cystoscopy did not  show  any residual bladder stone.  There was active bleeding and as stated, there  were no urothelial lesions.  At this point, [name] [name] bladder was left full  and the scope removed.  A 20-French coude catheter was placed with return  of clear to light pink urine and balloon inflated with 10 mL of sterile  water and left to gravity drainage to a leg bag.  The procedure was  completed without complication.  Surgical counts correct at the end the  case.  [name] [name] tolerated the procedure well.  The patient was brought out  of anesthesia and taken to recovery room in stable and satisfactory  condition.  [name] [name] will be discharged home with Foley catheter in place.   He will complete his course of antibiotics as previously prescribed.  We  will plan for a  voiding trial early next week.           D:  01/04/2020 17:51 PM by Dr. Cleotilde Neer. Leonor Liv, MD ([id number])  T:  01/05/2020 05:15 AM by NTS      (Conf: [id number]) (Doc ID: [id number])

## 2020-01-07 ENCOUNTER — Ambulatory Visit: Payer: Self-pay

## 2020-01-09 ENCOUNTER — Ambulatory Visit (INDEPENDENT_AMBULATORY_CARE_PROVIDER_SITE_OTHER): Payer: Medicare Other

## 2020-01-09 NOTE — Progress Notes (Deleted)
Patient is here  for a postoperative visit s/p Cystourethroscopy & Cystolitholapaxy greater than 2.5 cm. by Dr Leonor Liv on 01/04/2020.    Patient reports that he is feeling well--Denies N/V/F/C, abdominal or pelvic pain, or other symptom of systemic infection.  Reports mimimal pain that is managed well.  Patients appetite is returning to normal, hes eating and drinking well, and is having regular bowel movements on stool softeners.  Foley catheter has been draining well; clear yellow urine is collecting in the catheter drainage bag.    Removed patient's 18 Fr Foley catheter and leg drainage bag without difficulty.  Patient tolerated catheter removal well.    Patient understands to increase fluids today, return to office if has not voided within 5-6 hours, or if having difficulty voiding.  Contact doctor if N/V/F/C, worsening gross hematuria, dysuria, new pain or other concern for infection.    May drive now that Foley catheter has been removed.  Do daily walking for exercise.  No heavy lifting for 3 more weeks, increase loads gradually.    Care of this patient reviewed by Dr Dimple Casey, the supervising physician.

## 2020-01-12 LAB — STONE ANALYSIS

## 2020-02-19 ENCOUNTER — Encounter (INDEPENDENT_AMBULATORY_CARE_PROVIDER_SITE_OTHER): Payer: Self-pay

## 2020-02-23 ENCOUNTER — Ambulatory Visit (INDEPENDENT_AMBULATORY_CARE_PROVIDER_SITE_OTHER): Payer: Medicare Other

## 2020-02-23 DIAGNOSIS — Z23 Encounter for immunization: Secondary | ICD-10-CM

## 2020-03-15 ENCOUNTER — Encounter (INDEPENDENT_AMBULATORY_CARE_PROVIDER_SITE_OTHER): Payer: Self-pay

## 2020-03-28 ENCOUNTER — Encounter (INDEPENDENT_AMBULATORY_CARE_PROVIDER_SITE_OTHER): Payer: Self-pay

## 2020-03-29 ENCOUNTER — Ambulatory Visit (INDEPENDENT_AMBULATORY_CARE_PROVIDER_SITE_OTHER): Payer: Medicare Other

## 2020-03-29 DIAGNOSIS — Z23 Encounter for immunization: Secondary | ICD-10-CM

## 2022-05-01 ENCOUNTER — Inpatient Hospital Stay
Admission: EM | Admit: 2022-05-01 | Discharge: 2022-05-07 | DRG: 190 | Disposition: A | Discharge status: Home Health | Payer: Medicare Other | Attending: Internal Medicine | Admitting: Internal Medicine

## 2022-05-01 ENCOUNTER — Inpatient Hospital Stay: Payer: Medicare Other

## 2022-05-01 ENCOUNTER — Emergency Department: Payer: Medicare Other

## 2022-05-01 DIAGNOSIS — T380X5A Adverse effect of glucocorticoids and synthetic analogues, initial encounter: Secondary | ICD-10-CM | POA: Diagnosis present

## 2022-05-01 DIAGNOSIS — R739 Hyperglycemia, unspecified: Secondary | ICD-10-CM | POA: Diagnosis present

## 2022-05-01 DIAGNOSIS — J439 Emphysema, unspecified: Principal | ICD-10-CM | POA: Diagnosis present

## 2022-05-01 DIAGNOSIS — R339 Retention of urine, unspecified: Secondary | ICD-10-CM

## 2022-05-01 DIAGNOSIS — J9620 Acute and chronic respiratory failure, unspecified whether with hypoxia or hypercapnia: Principal | ICD-10-CM | POA: Diagnosis present

## 2022-05-01 DIAGNOSIS — Z681 Body mass index (BMI) 19 or less, adult: Secondary | ICD-10-CM

## 2022-05-01 DIAGNOSIS — Z87442 Personal history of urinary calculi: Secondary | ICD-10-CM

## 2022-05-01 DIAGNOSIS — R338 Other retention of urine: Secondary | ICD-10-CM | POA: Diagnosis present

## 2022-05-01 DIAGNOSIS — R627 Adult failure to thrive: Secondary | ICD-10-CM | POA: Diagnosis present

## 2022-05-01 DIAGNOSIS — J939 Pneumothorax, unspecified: Secondary | ICD-10-CM

## 2022-05-01 DIAGNOSIS — D72829 Elevated white blood cell count, unspecified: Secondary | ICD-10-CM | POA: Diagnosis present

## 2022-05-01 DIAGNOSIS — F17210 Nicotine dependence, cigarettes, uncomplicated: Secondary | ICD-10-CM | POA: Diagnosis present

## 2022-05-01 DIAGNOSIS — R64 Cachexia: Secondary | ICD-10-CM | POA: Diagnosis present

## 2022-05-01 DIAGNOSIS — Z66 Do not resuscitate: Secondary | ICD-10-CM | POA: Diagnosis present

## 2022-05-01 DIAGNOSIS — R0602 Shortness of breath: Secondary | ICD-10-CM

## 2022-05-01 DIAGNOSIS — J449 Chronic obstructive pulmonary disease, unspecified: Secondary | ICD-10-CM

## 2022-05-01 DIAGNOSIS — E43 Unspecified severe protein-calorie malnutrition: Secondary | ICD-10-CM | POA: Diagnosis present

## 2022-05-01 DIAGNOSIS — Z20822 Contact with and (suspected) exposure to covid-19: Secondary | ICD-10-CM | POA: Diagnosis present

## 2022-05-01 DIAGNOSIS — J9621 Acute and chronic respiratory failure with hypoxia: Secondary | ICD-10-CM | POA: Diagnosis present

## 2022-05-01 DIAGNOSIS — R000 Tachycardia, unspecified: Secondary | ICD-10-CM | POA: Diagnosis present

## 2022-05-01 DIAGNOSIS — J9312 Secondary spontaneous pneumothorax: Secondary | ICD-10-CM | POA: Diagnosis present

## 2022-05-01 DIAGNOSIS — N401 Enlarged prostate with lower urinary tract symptoms: Secondary | ICD-10-CM | POA: Diagnosis present

## 2022-05-01 DIAGNOSIS — Z515 Encounter for palliative care: Secondary | ICD-10-CM

## 2022-05-01 LAB — CBC AND DIFFERENTIAL
Absolute NRBC: 0 10*3/uL (ref 0.00–0.00)
Basophils Absolute Automated: 0.09 10*3/uL — ABNORMAL HIGH (ref 0.00–0.08)
Basophils Automated: 0.9 %
Eosinophils Absolute Automated: 0.18 10*3/uL (ref 0.00–0.44)
Eosinophils Automated: 1.9 %
Hematocrit: 44.5 % (ref 37.6–49.6)
Hgb: 13.5 g/dL (ref 12.5–17.1)
Immature Granulocytes Absolute: 0.04 10*3/uL (ref 0.00–0.07)
Immature Granulocytes: 0.4 %
Instrument Absolute Neutrophil Count: 5.25 10*3/uL (ref 1.10–6.33)
Lymphocytes Absolute Automated: 3.36 10*3/uL — ABNORMAL HIGH (ref 0.42–3.22)
Lymphocytes Automated: 34.6 %
MCH: 28 pg (ref 25.1–33.5)
MCHC: 30.3 g/dL — ABNORMAL LOW (ref 31.5–35.8)
MCV: 92.3 fL (ref 78.0–96.0)
MPV: 9.4 fL (ref 8.9–12.5)
Monocytes Absolute Automated: 0.78 10*3/uL (ref 0.21–0.85)
Monocytes: 8 %
Neutrophils Absolute: 5.25 10*3/uL (ref 1.10–6.33)
Neutrophils: 54.2 %
Nucleated RBC: 0 /100 WBC (ref 0.0–0.0)
Platelets: 330 10*3/uL (ref 142–346)
RBC: 4.82 10*6/uL (ref 4.20–5.90)
RDW: 12 % (ref 11–15)
WBC: 9.7 10*3/uL — ABNORMAL HIGH (ref 3.10–9.50)

## 2022-05-01 LAB — COMPREHENSIVE METABOLIC PANEL
ALT: 17 U/L (ref 0–55)
AST (SGOT): 30 U/L (ref 5–41)
Albumin/Globulin Ratio: 1.1 (ref 0.9–2.2)
Albumin: 3.8 g/dL (ref 3.5–5.0)
Alkaline Phosphatase: 86 U/L (ref 37–117)
Anion Gap: 8 (ref 5.0–15.0)
BUN: 16 mg/dL (ref 9.0–28.0)
Bilirubin, Total: 0.3 mg/dL (ref 0.2–1.2)
CO2: 28 mEq/L (ref 17–29)
Calcium: 9.1 mg/dL (ref 7.9–10.2)
Chloride: 106 mEq/L (ref 99–111)
Creatinine: 0.9 mg/dL (ref 0.5–1.5)
Globulin: 3.6 g/dL (ref 2.0–3.6)
Glucose: 232 mg/dL — ABNORMAL HIGH (ref 70–100)
Potassium: 4.2 mEq/L (ref 3.5–5.3)
Protein, Total: 7.4 g/dL (ref 6.0–8.3)
Sodium: 142 mEq/L (ref 135–145)
eGFR: >60 mL/min/{1.73_m2} (ref >=60)

## 2022-05-01 LAB — GLUCOSE WHOLE BLOOD - POCT
Whole Blood Glucose POCT: 171 mg/dL — ABNORMAL HIGH (ref 70–100)
Whole Blood Glucose POCT: 143 mg/dL — ABNORMAL HIGH (ref 70–100)
Whole Blood Glucose POCT: 141 mg/dL — ABNORMAL HIGH (ref 70–100)
Whole Blood Glucose POCT: 251 mg/dL — ABNORMAL HIGH (ref 70–100)

## 2022-05-01 LAB — ECG 12-LEAD
Atrial Rate: 112 {beats}/min
P Axis: 87 degrees
P-R Interval: 152 ms
Q-T Interval: 350 ms
QRS Duration: 72 ms
QTC Calculation (Bezet): 477 ms
R Axis: 84 degrees
T Axis: 90 degrees
Ventricular Rate: 112 {beats}/min

## 2022-05-01 LAB — COVID-19 (SARS-COV-2) & INFLUENZA  A/B, NAA (ROCHE LIAT)
Influenza A: NOT DETECTED
Influenza B: NOT DETECTED
SARS CoV 2 Overall Result: NOT DETECTED

## 2022-05-01 LAB — HIGH SENSITIVITY TROPONIN-I: hs Troponin-I: <2.7 ng/L

## 2022-05-01 LAB — LACTIC ACID, PLASMA: Lactic Acid: 1.7 mmol/L (ref 0.2–2.0)

## 2022-05-01 LAB — PROCALCITONIN: Procalcitonin: 0.03 ng/ml (ref 0.00–0.10)

## 2022-05-01 MED ORDER — MORPHINE SULFATE 2 MG/ML IJ/IV SOLN (WRAP)
1.0000 mg | Status: DC | PRN
Start: 2022-05-01 — End: 2022-05-03
  Administered 2022-05-01 (×2): 1 mg via INTRAVENOUS
  Filled 2022-05-01 (×2): qty 1

## 2022-05-01 MED ORDER — LIDOCAINE HCL 1 % IJ SOLN
10.0000 mL | Freq: Once | INTRAMUSCULAR | Status: AC
Start: 2022-05-01 — End: 2022-05-01
  Administered 2022-05-01: 10 mL via INTRADERMAL
  Filled 2022-05-01: qty 20

## 2022-05-01 MED ORDER — INSULIN LISPRO 100 UNIT/ML SOLN (WRAP)
1.0000 [IU] | Freq: Three times a day (TID) | Status: DC
Start: 2022-05-01 — End: 2022-05-07
  Administered 2022-05-02 – 2022-05-07 (×2): 2 [IU] via SUBCUTANEOUS
  Filled 2022-05-01 (×2): qty 6

## 2022-05-01 MED ORDER — DEXAMETHASONE SODIUM PHOSPHATE 4 MG/ML IJ SOLN (WRAP)
8.0000 mg | Freq: Two times a day (BID) | INTRAMUSCULAR | Status: DC
Start: 2022-05-01 — End: 2022-05-03
  Administered 2022-05-01 – 2022-05-03 (×5): 8 mg via INTRAVENOUS
  Filled 2022-05-01 (×5): qty 5

## 2022-05-01 MED ORDER — STERILE WATER FOR INJECTION IJ/IV SOLN (WRAP)
1.0000 g | INTRAMUSCULAR | Status: DC
Start: 2022-05-01 — End: 2022-05-02
  Administered 2022-05-01 – 2022-05-02 (×2): 1 g via INTRAVENOUS
  Filled 2022-05-01 (×2): qty 1000

## 2022-05-01 MED ORDER — ALBUTEROL SULFATE (2.5 MG/3ML) 0.083% IN NEBU
2.5000 mg | INHALATION_SOLUTION | Freq: Once | RESPIRATORY_TRACT | Status: AC
Start: 2022-05-01 — End: 2022-05-01
  Administered 2022-05-01: 2.5 mg via RESPIRATORY_TRACT
  Filled 2022-05-01: qty 3

## 2022-05-01 MED ORDER — NICOTINE 21 MG/24HR TD PT24
1.0000 | MEDICATED_PATCH | Freq: Every day | TRANSDERMAL | Status: DC
Start: 2022-05-01 — End: 2022-05-07
  Administered 2022-05-02 – 2022-05-07 (×6): 1 via TRANSDERMAL
  Filled 2022-05-01 (×6): qty 1

## 2022-05-01 MED ORDER — METHYLPREDNISOLONE SODIUM SUCC 125 MG IJ SOLR (WRAP)
125.0000 mg | Freq: Once | INTRAMUSCULAR | Status: AC
Start: 2022-05-01 — End: 2022-05-01
  Administered 2022-05-01: 125 mg via INTRAVENOUS
  Filled 2022-05-01: qty 2

## 2022-05-01 MED ORDER — SENNOSIDES-DOCUSATE SODIUM 8.6-50 MG PO TABS
1.0000 | ORAL_TABLET | Freq: Two times a day (BID) | ORAL | Status: DC
Start: 2022-05-01 — End: 2022-05-07
  Administered 2022-05-01 – 2022-05-07 (×12): 1 via ORAL
  Filled 2022-05-01 (×12): qty 1

## 2022-05-01 MED ORDER — HEPARIN SODIUM (PORCINE) 5000 UNIT/ML IJ SOLN
5000.0000 [IU] | Freq: Three times a day (TID) | INTRAMUSCULAR | Status: DC
Start: 2022-05-01 — End: 2022-05-07
  Administered 2022-05-01 – 2022-05-07 (×18): 5000 [IU] via SUBCUTANEOUS
  Filled 2022-05-01 (×17): qty 1

## 2022-05-01 MED ORDER — ACETAMINOPHEN 325 MG PO TABS
650.0000 mg | ORAL_TABLET | Freq: Four times a day (QID) | ORAL | Status: DC | PRN
Start: 2022-05-01 — End: 2022-05-07
  Administered 2022-05-01: 650 mg via ORAL
  Filled 2022-05-01: qty 2

## 2022-05-01 MED ORDER — IPRATROPIUM BROMIDE 0.02 % IN SOLN
0.5000 mg | Freq: Once | RESPIRATORY_TRACT | Status: AC
Start: 2022-05-01 — End: 2022-05-01
  Administered 2022-05-01: 0.5 mg via RESPIRATORY_TRACT
  Filled 2022-05-01: qty 2.5

## 2022-05-01 MED ORDER — AZITHROMYCIN 500 MG IN 250 ML NS IVPB VIAL-MATE (CNR)
500.0000 mg | INTRAVENOUS | Status: DC
Start: 2022-05-01 — End: 2022-05-03
  Administered 2022-05-01 – 2022-05-03 (×3): 500 mg via INTRAVENOUS
  Filled 2022-05-01 (×3): qty 500

## 2022-05-01 MED ORDER — MORPHINE SULFATE 2 MG/ML IJ/IV SOLN (WRAP)
2.0000 mg | Freq: Once | Status: AC
Start: 2022-05-01 — End: 2022-05-01
  Administered 2022-05-01: 2 mg via INTRAVENOUS
  Filled 2022-05-01: qty 1

## 2022-05-01 MED ORDER — ALBUTEROL-IPRATROPIUM 2.5-0.5 (3) MG/3ML IN SOLN
3.0000 mL | RESPIRATORY_TRACT | Status: DC
Start: 2022-05-01 — End: 2022-05-07
  Administered 2022-05-01 – 2022-05-07 (×37): 3 mL via RESPIRATORY_TRACT
  Filled 2022-05-01 (×27): qty 3

## 2022-05-01 MED ORDER — BUDESONIDE 0.5 MG/2ML IN SUSP
0.5000 mg | Freq: Two times a day (BID) | RESPIRATORY_TRACT | Status: DC
Start: 2022-05-01 — End: 2022-05-03
  Administered 2022-05-01 – 2022-05-03 (×4): 0.5 mg via RESPIRATORY_TRACT
  Filled 2022-05-01 (×4): qty 2

## 2022-05-01 MED ORDER — INSULIN LISPRO 100 UNIT/ML SOLN (WRAP)
1.0000 [IU] | Freq: Every evening | Status: DC
Start: 2022-05-01 — End: 2022-05-07
  Administered 2022-05-01: 2 [IU] via SUBCUTANEOUS
  Administered 2022-05-02: 1 [IU] via SUBCUTANEOUS
  Filled 2022-05-01: qty 3
  Filled 2022-05-01 (×2): qty 6
  Filled 2022-05-01: qty 3

## 2022-05-01 MED ORDER — ALBUTEROL-IPRATROPIUM 2.5-0.5 (3) MG/3ML IN SOLN
3.0000 mL | Freq: Four times a day (QID) | RESPIRATORY_TRACT | Status: DC
Start: 2022-05-01 — End: 2022-05-01

## 2022-05-01 NOTE — H&P (Addendum)
[name] [name]  Medical Critical Care Service [facility]) Progress Note    Date / Time: 05/01/2310:24 AM  Room: M605/M605-01  Patient:   [name],[name]  Admitted: 05/01/2022  Attending:   Luz Brazen, MD  Status: Full Code     Hospital Day /  LOS: 0 days      ICU admission/transfer date: 05/01/22     Assessment    [name] [name] is a 71 y.o. male with hx of active smoking, severe pulmonary emphysema who presented with acute onset of dyspnea overnight.  Per medic report patient's POX was 86%, tachypneic, tachycardic 130-140s.  No fevers.  Per son, he was at his baseline yesterday.     In ED, he was placed on 5 LPM via NC, subsequently switched to BIPAP 10/5, 40%.  CXR with evidence of R small to moderate PTX.  CT chest with moderate PTX, severe pulmonary bullous emphysema.   Negative troponin, negative lactate. Got nebs and iv steroids.     Does not take any medications at baseline.  Continues to smoke.          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  Off of BIPAP.  Diet started.         Plan     Critical Care services by organ system    NEURO:  # at risk for delirium  Delirium precautions including maintenance of day/night wake cycles, frequent reorientation as needed        PULM:  # AE-COPD  # R secondary spontaneous PTX in setting of severe pulmonary bullous emphysema   IV steroids, bronchodilators, empiric abx, respiratory cx if able   Pulmicort neb bid  Chest tube to -20 cmH2O  IS  Minimize positive pressure ventilation; off of bipap now   Smoking cessation  Serial CXR        CARDIAC:  # stable hemodynamics  Defend MAP > 65.         GI:  # NAI  Bowel Regimen: peri colace   GI  prophylaxis: n/a   Nutrition: regular diet        RENAL:  # No acute issues   Monitor lytes        HEME/ONC:  # at risk for VTE  SCDs  Pharmacologic prophylaxis: sqh      ID:  # AE-COPD  Empiric CTX and azithromycin  PCT- added on  Respiratory Cx if able           ENDO/RHEUM:  # no known hx of diabetes  Glucose goal: 100-180; lispro correction if elevated in setting of iv steroids          iMED CONSENTS: view in Chart Review under Consents tab  ICU bundled consent obtained - n/a    Transfusion consent obtained - n/a        FAMILY/GOALS OF CARE: Full Code  Plan of care d/w son and patient extensively.  Code status d/w patient, at this time wishes to be full code.  Informed son that given severity of emphysema/end stage COPD, will not be able  to come off the ventilator if ever needed.  Further GOC discussions as needed.  In addition, if he has cardiac arrest, in all likelihood would be due to pulmonary issues, strongly recommended against CPR.  For now, patient wishes to remain full code.  Palliative care consult appropriate.        DISPO:   ICU admission 6/3.       Checklist updated on 6/3  Last CPOT:    Last RASS: RASS Score: Alert and Calm  Delirium: Positive or Negative for Delirium: Negative  Mobility: PMP Activity: Step 3 - Bed Mobility (05/01/2022 10:00 AM)      Analgesia & Sedation    Goals -  [] N/A [x] CPOT [x] RASS   Restraint(s) -  [] N/A [] Discontinue [] Start/Continue (risk of self-harm)   Monitor CAM-ICU -    [] N/A [] Discontinue [x] Start/Continue CAM-ICU   Activity (PT/OT) -    [] Bedrest [x] Sitting (bedside/chair) []  Ambulate       Respiratory    Intubation -  [x] N/A [] ETT [] Trach []  Mechanical Ventilation   (SAT/SBT) -  [] N/A [] No  [] Yes  [] Ready to Extubate   Tracheostomy -  [] N/A [] Yes (assessment)   Gasterointestinal    Ulcer Prophylaxis -  [x] Not indicated [] Discontinue [] Start/Continue   Bowel Regimen -  [x] Yes []  Not indicated   Rectal tube -  [x] Not indicated [] Discontinue [] Start/Continue    Hospital-Acquired Infections    BSI Prevention -  [x] N/A [] Discontinue [] Start/Continue   CAUTI Prevention -  [x] N/A [] Discontinue [] Start/Continue    Other lines/Devices -  [] N/A [x] See LDA below for further detail   Abx Stewardship -  [] N/A [] Discontinue [x] Start/Continue (see MAR)   VTE Prevention    Mechanical -   [x] SCD ordered [] SCD contraindicated   Chemical -  [x] Ppx ordered  [] Therapeutic AC []  Held/Deferred    Goals of Care    Code Status -   Full Code   POA  Identified -  [x]  Yes []  No   Family Updated -  [x]  Yes []  No         Physical Exam   BP 116/67   Pulse 91   Temp (!) 96.8 F (36 C) (Axillary)   Resp 21   Ht (!) 0.053 m (2.09")   Wt (!) 30.8 kg (67 lb 14.4 oz)   SpO2 97%   BMI 10960.73 kg/m      General: mild tachypnea, otherwise cooperative. Extremely cachectic.   Neuro: AOx3, grossly non focal motor, MAE x4  Heart: RRR  Lungs: diminished BS bilaterally.   Abdomen: soft, NT, ND.    Extremities: no edema, thin extremities   Skin: no rash.      Data / Meds / Labs / Rads     Patient Lines/Drains/Airways Status       Active Lines, Drains and Airways       Name Placement date Placement time Site Days    Peripheral IV 05/01/22 18 G Right Antecubital 05/01/22  0514  Antecubital  less than 1    Peripheral IV 05/01/22 20 G Left Antecubital 05/01/22  0630  Antecubital  less than 1                   SCHEDULED MEDICATIONS: PRN MEDS:   Current Facility-Administered Medications   Medication Dose Route Frequency    albuterol-ipratropium  3 mL Nebulization Q4H SCH    azithromycin  500 mg Intravenous Q24H    budesonide  0.5 mg Nebulization BID    cefTRIAXone  1 g  Intravenous Q24H    dexAMETHasone  8 mg Intravenous Q12H SCH    heparin (porcine)  5,000 Units Subcutaneous Q8H SCH    insulin lispro  1-3 Units Subcutaneous QHS    insulin lispro  1-5 Units Subcutaneous TID AC    nicotine  1 patch Transdermal Daily    senna-docusate  1 tablet Oral Q12H SCH    acetaminophen, morphine   INFUSION MEDS:           Intake/Output Summary (Last 24 hours) at 05/01/2022 1124  Last data filed at 05/01/2022 1006  Gross per 24 hour   Intake 30 ml   Output --   Net 30 ml     Recent Labs     05/01/22  0516   WBC 9.70*   Hgb 13.5   Hematocrit 44.5     Recent Labs     05/01/22  0516   Sodium 142   Potassium 4.2   Chloride 106   CO2 28   BUN 16.0   Creatinine 0.9   Glucose 232*   Calcium 9.1     No results for input(s): PT, INR, PTT in the last 72 hours.  XR Chest AP Portable   Final Result       1. Status post chest tube placement as described. There is a suspected residual right apical pneumothorax noted.      Lorenda Peck, MD   05/01/2022 9:41 AM      CT Chest without Contrast   Final Result    Extensive emphysematous changes and parenchymal scarring. There is a moderate size right-sided pneumothorax. The lungs however remain relatively well expanded. Continued follow-up is recommended.            Note: A combination of automatic exposure control, adjustment of the MA and/or KV according to patient size and/or use of iterative reconstructive technique was utilized.         Lorenda Peck, MD   05/01/2022 7:27 AM      XR Chest  AP Portable   Final Result         Small right pneumothorax. Emphysema.      Aldean Ast, MD   05/01/2022 5:54 AM      XR Chest AP Portable    (Results Pending)      _____________________________________________________________________    My cumulative time taking care of this critically ill patient is 105 minutes. This time is exclusive of teaching, billable procedures, and does not overlap with any other providers.  This patient has a high probability of sudden clinically significant deterioration which requires the highest level of physician preparedness to intervene urgently. I managed/supervised life or organ supporting interventions that required frequent physician assessments. I devoted my full attention to the direct care of this patient for this period of time.    [name] Brazen, MD  Critical Care Medicine

## 2022-05-01 NOTE — UM Notes (Signed)
Admit to Inpatient (Order #[id number]) on 05/01/22        PATIENT NAME: [name] [name],[name] [name]   DOB: 1951-03-06     Reason for Admission  71 y.o. male with hx of active smoking, severe pulmonary emphysema who presented with acute onset of dyspnea overnight.  Per medic report patient's POX was 86%, tachypneic, tachycardic 130-140s.  No fevers.  Per son, he was at his baseline yesterday.   In ED, he was placed on 5 LPM via NC, subsequently switched to BIPAP 10/5, 40%.  CXR with evidence of R small to moderate PTX.  CT chest with moderate PTX, severe pulmonary bullous emphysema.    Temp:  [96 F (35.6 C)-96.8 F (36 C)] 96.6 F (35.9 C)  Heart Rate:  [86-134] 86  Resp Rate:  [17-38] 19  BP: (105-186)/(64-104) 111/64  FiO2:  [40 %] 40 %     FiO2:  [40 %] 40 %  S RR:  [12] 12  PEEP/EPAP:  [5 cm H20] 5 cm H20     Meds Given:   methylPREDNISolone sodium succinate (Solu-MEDROL) injection 125 mg  Start: 05/01/22 0521     dexAMETHasone (DECADRON) injection 8 mg  Start: 05/01/22 0900     morphine injection 2 mg  Start: 05/01/22 0900     cefTRIAXone (ROCEPHIN) 1 g in sterile water (preservative free) 10 mL IV push injection  Start: 05/01/22 1030  200 mL/hr   azithromycin (ZITHROMAX) 500 mg in sodium chloride 0.9 % 250 mL IVPB  Start: 05/01/22 1030  0 mL/hr   morphine injection 1 mg  Start: 05/01/22 1007         Scheduled Meds:  Current Facility-Administered Medications   Medication Dose Route Frequency    albuterol-ipratropium  3 mL Nebulization Q4H SCH    azithromycin  500 mg Intravenous Q24H    budesonide  0.5 mg Nebulization BID    cefTRIAXone  1 g Intravenous Q24H    dexAMETHasone  8 mg Intravenous Q12H SCH    heparin (porcine)  5,000 Units Subcutaneous Q8H SCH    insulin lispro  1-3 Units Subcutaneous QHS    insulin lispro  1-5 Units Subcutaneous TID AC    nicotine  1 patch Transdermal Daily    senna-docusate  1 tablet Oral Q12H SCH     Continuous Infusions:  PRN Meds:.acetaminophen, morphine     Abnormal Labs WBC 9.7, glucose 232,      Imaging   CXR: Small right pneumothorax. Emphysema  CT Chest: Extensive emphysematous changes and parenchymal scarring. There is a moderate size right-sided pneumothorax. The lungs however remain relatively well expanded. Continued follow-up is recommended  CXR: 1. Status post chest tube placement as described. There is a suspected residual right apical pneumothorax noted.    Exam:  General: mild tachypnea, otherwise cooperative. Extremely cachectic.   Neuro: AOx3, grossly non focal motor, MAE x4  Heart: RRR  Lungs: diminished BS bilaterally    Plan of Care  PULM:   AE-COPD   R secondary spontaneous PTX in setting of severe pulmonary bullous emphysema   IV steroids, bronchodilators, empiric abx, respiratory cx if able   Pulmicort neb bid  Chest tube to -20 cmH2O  IS  Minimize positive pressure ventilation; off of bipap now   Smoking cessation  Serial CXR  CARDIAC:  stable hemodynamics  Defend MAP > 65.   GI:  NAI  Bowel Regimen: peri colace   GI prophylaxis: n/a   Nutrition: regular diet  ID:   AE-COPD  Empiric CTX and azithromycin  PCT- added on  Respiratory Cx if able    ENDO/RHEUM:   no known hx of diabetes  Glucose goal: 100-180; lispro correction if elevated in setting of iv steroids  FAMILY/GOALS OF CARE: Full Code    UTILIZATION REVIEW CONTACT: Faythe DingwallElaine Firman-Garcia  RN, BSN  Utilization Review   [facility]  [address]  [address]  [place], [place] [id number]  Phone: [phone] (Voice Mail Only)  Email: [email or web address]  Tax :  [phone]:  [phone]         NOTES TO REVIEWER:    This clinical review is based on/compiled from documentation provided by the treatment team within the patient's medical record.

## 2022-05-01 NOTE — Progress Notes (Signed)
EICU note  Call received by Dr. Harrel Lemon ED Attending, for patient with emphysema, acute shortness of breath and hypoxemia. CXR showed a small rt pneumothorax. The patient is on Bipap. I requested that he undergoes  imaging with chest CT to further evaluate the size of the PTX as he has bullous lesions on the left. CT chest seen- he has a significant right pneumothorax. He would need drainage -by CT/or pigtail catheter if he is receiving positive pressure.  Pt is in the ED and was not seen. Case signed out to Dr. Leron Croak.

## 2022-05-01 NOTE — ED Provider Notes (Signed)
EMERGENCY DEPARTMENT NOTE     Patient initially seen and examined at   ED PHYSICIAN ASSIGNED       Date/Time Event User Comments    05/01/22 0502 Physician Assigned Norberto Sorenson. Norberto Sorenson, DO assigned as Attending           ED MIDLEVEL (APP) ASSIGNED       None            HISTORY OF PRESENT ILLNESS   Independent Historian:EMS and family provide history of present illness.  Translator Used: mvtranslator: no    Chief Complaint: Respiratory Distress       71 y.o. male comes in from home for evaluation of shortness of breath.  Son states earlier tonight the patient had an episode of shortness of breath which resolved on its own.  He then had a second episode which appeared more severe associated with diaphoresis so he called the ambulance.  Patient room air sat 85% he was given a DuoNeb prior to arrival with his saturation improved to 95% and greater.  Patient denies chest pain.  No cough or recent illness.  He has a known history of emphysema and has a known collapsed L lung at baseline.      MEDICAL HISTORY     Past Medical History:  Past Medical History:   Diagnosis Date    Collapsed lung     Emphysema lung     Enlarged prostate        Past Surgical History:  Past Surgical History:   Procedure Laterality Date    CYSTOSCOPY, LITHOLAPAXY N/A 01/04/2020    Procedure: Gabriel Rainwater;  Surgeon: Miquel Dunn, MD;  Location: [facility];  Service: Urology;  Laterality: N/A;  CYSTOLITHOLAPAXY    PROSTATE SURGERY         Social History:  Social History     Socioeconomic History    Marital status: Unknown   Tobacco Use    Smoking status: Every Day     Years: 50.00     Types: Cigarettes    Smokeless tobacco: Never   Vaping Use    Vaping status: Never Used   Substance and Sexual Activity    Alcohol use: Never    Drug use: Never       Family History:  No family history on file.    Outpatient Medication:  Current Discharge Medication List        CONTINUE these medications which have NOT CHANGED     Details   oxyCODONE (ROXICODONE) 5 MG immediate release tablet Take 1 tablet (5 mg total) by mouth every 4 (four) hours as needed for Pain  Qty: 15 tablet, Refills: 0      phenazopyridine (PYRIDIUM) 200 MG tablet Take 1 tablet (200 mg total) by mouth 3 (three) times daily as needed for Pain  Qty: 15 tablet, Refills: 0               REVIEW OF SYSTEMS   Review of Systems   Constitutional:  Negative for fever.   HENT: Negative.     Eyes: Negative.    Respiratory:  Positive for shortness of breath and wheezing.    Cardiovascular:  Negative for chest pain.   Gastrointestinal: Negative.    Genitourinary: Negative.    Musculoskeletal: Negative.    Neurological: Negative.        PHYSICAL EXAM     ED Triage Vitals   Enc Vitals Group  BP       Pulse       Resp       Temp       Temp src       SpO2       Weight       Height       Head Circumference       Peak Flow       Pain Score       Pain Loc       Pain Edu?       Excl. in GC?        CONSTITUTIONAL:  moderate resp distress  EYES:  no scleral icterus  HEAD:  atraumatic  ENT:  mucus membranes moist  NECK:  supple, trachea midline  CARDIOVASCULAR:  tachycardic  PULMONARY/CHEST: Patient with retractions, he is tripoding.  He has absent breath sounds on the left, inspiratory and expiratory wheezing on the right.  ABDOMEN:  softr  MUSCULOSKELETAL:  no swelling, tenderness or deformity  SKIN:  warm and dry  NEURO:  AOX4  PSYCH:  appropriate behavior    MEDICAL DECISION MAKING     PRIMARY PROBLEM LIST             Acute illness/injury with risk to life or bodily function (based on differential diagnosis or evaluation) DIAGNOSIS:pneumothorax, copd  Chronic Illness Impacting Care of the above problem: Advanced age Increases the risk of severe disease  Differential Diagnosis (included, but not limited to: shortness of breath: COPD exacerbation, CHF exacerbation, Pulmonary Embolism, Pneumonia, Pneumothorax, Pulmonary edema, COVID19     DISCUSSION            If patient is being  hospitalized is severe sepsis or septic shock suspected?: N/A      Was management discussed with a consultant?: Yes (explain)Dr. John admitting eICU for above dx    Was the decision around the need for surgery discussed with consultant: N/A    External Records Reviewed?: EMS Record review of prehospital care    Diagnostic test considered and not performed: N/A    Prescription medications considered and not given: N/A    N/A    Social Determinants of Health Considerations: N/A    Was there decision to not resuscitate or to de-escalate care due to poor prognosis?: N/A    ED Course as of 05/01/22 2134   Sat May 01, 2022   0554 Placed on BiPap; improved [NH]      ED Course User Index  [NH] Norberto Sorenson, DO         Vital Signs: Reviewed the patient's vital signs.   Nursing Notes: Reviewed and utilized available nursing notes.  Medical Records Reviewed: Reviewed available past medical records.  Counseling: The emergency provider has spoken with the patient and discussed today's findings, in addition to providing specific details for the plan of care.  Questions are answered and there is agreement with the plan.      MIPS DOCUMENTATION                CARDIAC STUDIES    The following cardiac studies were independently interpreted by the Emergency Medicine Physician.  For full cardiac study results please see chart.    Monitor Strip  Interpreted by ED Physician  Rate: 110  Rhythm: ST  ST Changes: none      EKG Interpretation:  Signed and interpreted by ED Provider   Rate: 112  Rhythm: ST  ST segments: non-specific ST abnormalities  Interpretation: Nonspecific  EKG    EMERGENCY IMAGING STUDIES    The following imagine studies were independently interpreted by me (emergency physician):    Radiology:  Interpreted by me (ED Physician)  Study: Chest Xray   Results: No infiltrate.  Impression: + pneumo  .   CT Chest Interpreted by me (ED Provider)  Comparison: None available  RESULT: Other (explain)R and L  pneumo  IMPRESSION: Other (describe)pneumothorax    RADIOLOGY IMAGING STUDIES      XR Chest AP Portable   Final Result       1. Status post chest tube placement as described. There is a suspected residual right apical pneumothorax noted.      Lorenda Peck, MD   05/01/2022 9:41 AM      CT Chest without Contrast   Final Result    Extensive emphysematous changes and parenchymal scarring. There is a moderate size right-sided pneumothorax. The lungs however remain relatively well expanded. Continued follow-up is recommended.            Note: A combination of automatic exposure control, adjustment of the MA and/or KV according to patient size and/or use of iterative reconstructive technique was utilized.         Lorenda Peck, MD   05/01/2022 7:27 AM      XR Chest  AP Portable   Final Result         Small right pneumothorax. Emphysema.      Aldean Ast, MD   05/01/2022 5:54 AM      XR Chest AP Portable    (Results Pending)       EMERGENCY DEPT. MEDICATIONS      ED Medication Orders (From admission, onward)      Start Ordered     Status Ordering Provider    05/01/22 [phone] 05/01/22 0608  albuterol (PROVENTIL) (2.5 MG/3ML) 0.083% nebulizer solution 2.5 mg  RT - Once        Route: Nebulization  Ordered Dose: 2.5 mg       Last MAR action: Given Broden Holt M    05/01/22 0609 05/01/22 0608  ipratropium (ATROVENT) 0.02 % nebulizer solution 0.5 mg  RT - Once        Route: Nebulization  Ordered Dose: 0.5 mg       Last MAR action: Given Candra Wegner M    05/01/22 0521 05/01/22 0520  methylPREDNISolone sodium succinate (Solu-MEDROL) injection 125 mg  Once        Route: Intravenous  Ordered Dose: 125 mg       Last MAR action: Given Netha Dafoe M            LABORATORY RESULTS    Ordered and independently interpreted AVAILABLE laboratory tests.   Results       Procedure Component Value Units Date/Time    S. PNEUMONIAE, RAPID URINARY ANTIGEN [[id number]] Collected: 05/01/22 1445    Specimen: Urine, Clean Catch Updated: 05/01/22 2102     Narrative:      Rescheduled by [id number] at 05/01/2022 09:47 Reason: urine sterile will be   later  Rescheduled by [id number] at 05/01/2022 09:47 Reason: urine sterile will be   later    Glucose Whole Blood - POCT [[id number]]  (Abnormal) Collected: 05/01/22 1954     Updated: 05/01/22 2002     Whole Blood Glucose POCT 251 mg/dL     MRSA culture [[id number]] Collected: 05/01/22 0944    Specimen: Culturette from Nasal Swab Updated: 05/01/22 1653  MRSA culture [[id number]] Collected: 05/01/22 0946    Specimen: Culturette from Throat Updated: 05/01/22 1653    Procalcitonin [[id number]] Collected: 05/01/22 0516     Updated: 05/01/22 1643     Procalcitonin 0.03 ng/ml     Glucose Whole Blood - POCT [[id number]]  (Abnormal) Collected: 05/01/22 1559     Updated: 05/01/22 1602     Whole Blood Glucose POCT 141 mg/dL     Glucose Whole Blood - POCT [[id number]]  (Abnormal) Collected: 05/01/22 1152     Updated: 05/01/22 1155     Whole Blood Glucose POCT 143 mg/dL     Culture Blood Aerobic and Anaerobic [[id number]] Collected: 05/01/22 0520    Specimen: Blood, Venipuncture Updated: 05/01/22 1034    Narrative:      1 BLUE+1 PURPLE    Culture Blood Aerobic and Anaerobic [[id number]] Collected: 05/01/22 0520    Specimen: Blood, Intravenous Line Updated: 05/01/22 1034    Narrative:      Peripheral Line Draw.  1 BLUE+1 PURPLE    Glucose Whole Blood - POCT [[id number]]  (Abnormal) Collected: 05/01/22 0755     Updated: 05/01/22 0758     Whole Blood Glucose POCT 171 mg/dL     High Sensitivity Troponin-I [[id number]] Collected: 05/01/22 0520    Specimen: Blood Updated: 05/01/22 0614     hs Troponin-I <2.7 ng/L     Comprehensive metabolic panel [[id number]]  (Abnormal) Collected: 05/01/22 0516    Specimen: Blood Updated: 05/01/22 0548     Glucose 232 mg/dL      BUN 18.8 mg/dL      Creatinine 0.9 mg/dL      Sodium 416 mEq/L      Potassium 4.2 mEq/L      Chloride 106 mEq/L      CO2 28 mEq/L      Calcium 9.1 mg/dL      Protein, Total 7.4 g/dL      Albumin  3.8 g/dL      AST (SGOT) 30 U/L      ALT 17 U/L      Alkaline Phosphatase 86 U/L      Bilirubin, Total 0.3 mg/dL      Globulin 3.6 g/dL      Albumin/Globulin Ratio 1.1     Anion Gap 8.0     eGFR >60.0 mL/min/1.73 m2     COVID-19 (SARS-CoV-2) and Influenza A/B, NAA ([facility]) [[id number]] Collected: 05/01/22 0516    Specimen: Culturette from Nasopharyngeal Updated: 05/01/22 0545     Purpose of COVID testing Diagnostic -PUI     SARS-CoV-2 Specimen Source Nasal Swab     SARS CoV 2 Overall Result Not Detected     Influenza A Not Detected     Influenza B Not Detected    Narrative:      o Collect and clearly label specimen type:  o PREFERRED-Upper respiratory specimen: One Nasal Swab in  Transport Media.  o Hand deliver to laboratory ASAP  Diagnostic -PUI    CBC and differential [[id number]]  (Abnormal) Collected: 05/01/22 0516    Specimen: Blood Updated: 05/01/22 0526     WBC 9.70 x10 3/uL      Hgb 13.5 g/dL      Hematocrit 22.0 %      Platelets 330 x10 3/uL      RBC 4.82 x10 6/uL      MCV 92.3 fL      MCH 28.0 pg      MCHC 30.3 g/dL  RDW 12 %      MPV 9.4 fL      Instrument Absolute Neutrophil Count 5.25 x10 3/uL      Neutrophils 54.2 %      Lymphocytes Automated 34.6 %      Monocytes 8.0 %      Eosinophils Automated 1.9 %      Basophils Automated 0.9 %      Immature Granulocytes 0.4 %      Nucleated RBC 0.0 /100 WBC      Neutrophils Absolute 5.25 x10 3/uL      Lymphocytes Absolute Automated 3.36 x10 3/uL      Monocytes Absolute Automated 0.78 x10 3/uL      Eosinophils Absolute Automated 0.18 x10 3/uL      Basophils Absolute Automated 0.09 x10 3/uL      Immature Granulocytes Absolute 0.04 x10 3/uL      Absolute NRBC 0.00 x10 3/uL     Lactic Acid [[id number]][[id number]] Collected: 05/01/22 0516    Specimen: Blood Updated: 05/01/22 0523     Lactic Acid 1.7 mmol/L               CRITICAL CARE/PROCEDURES    Airway Management Interventions    Date/Time: 05/01/2022 5:00 AM    Performed by: Norberto SorensonHolliday, Kasey Ewings M, DO  Authorized by:  Norberto SorensonHolliday, Lindaann Gradilla M, DO  Airway interventions: BiPAP/CPAP      Critical Care    Performed by: Norberto SorensonHolliday, Bettey Muraoka M, DO  Authorized by: Norberto SorensonHolliday, Connie Hilgert M, DO    Critical care provider statement:     Critical care time (minutes): 75.    Critical care time was exclusive of:  Separately billable procedures and treating other patients    Critical care was necessary to treat or prevent imminent or life-threatening deterioration of the following conditions:  Respiratory failure    Critical care was time spent personally by me on the following activities:  Blood draw for specimens, development of treatment plan with patient or surrogate, discussions with consultants, evaluation of patient's response to treatment, examination of patient, obtaining history from patient or surrogate, ordering and performing treatments and interventions, ordering and review of laboratory studies, ordering and review of radiographic studies, review of old charts, pulse oximetry and re-evaluation of patient's condition    I assumed direction of critical care for this patient from another provider in my specialty: no      Care discussed with: admitting provider        DIAGNOSIS      Diagnosis:  Final diagnoses:   Acute on chronic respiratory failure, unspecified whether with hypoxia or hypercapnia   Emphysema with chronic bronchitis   Pneumothorax, unspecified type       Disposition:  ED Disposition       ED Disposition   Admit    Condition   --    Date/Time   Sat May 01, 2022  6:01 AM    Comment   Admitting Physician: Gurnee CrewsBEYZAEI ARANI, [facility] [[id number][facility]   Service:: Medicine [[id number]]   Estimated Length of Stay: > or = to 2 midnights   Tentative Discharge Plan?: Home or Self Care [1]   Does patient need telemetry?: Yes   Is patient 18 yrs or greater?: Yes   Telemetry type (separate Telemetry order is also required):: Adult telemetry                 Prescriptions:  Current Discharge Medication List        CONTINUE these medications which  have NOT CHANGED     Details   oxyCODONE (ROXICODONE) 5 MG immediate release tablet Take 1 tablet (5 mg total) by mouth every 4 (four) hours as needed for Pain  Qty: 15 tablet, Refills: 0      phenazopyridine (PYRIDIUM) 200 MG tablet Take 1 tablet (200 mg total) by mouth 3 (three) times daily as needed for Pain  Qty: 15 tablet, Refills: 0                  Norberto Sorenson, DO  05/01/22 2136

## 2022-05-01 NOTE — ED Notes (Signed)
Bed: E08  Expected date:   Expected time:   Means of arrival:   Comments:  Medic 424

## 2022-05-01 NOTE — Plan of Care (Signed)
Problem: Compromised Tissue integrity  Goal: Damaged tissue is healing and protected  Outcome: Progressing  Flowsheets (Taken 05/01/2022 1524)  Damaged tissue is healing and protected:   Monitor/assess Braden scale every shift   Provide wound care per wound care algorithm   Reposition patient every 2 hours and as needed unless able to reposition self   Increase activity as tolerated/progressive mobility   Relieve pressure to bony prominences for patients at moderate and high risk   Avoid shearing injuries   Keep intact skin clean and dry   Use bath wipes, not soap and water, for daily bathing   Use incontinence wipes for cleaning urine, stool and caustic drainage. Foley care as needed   Monitor external devices/tubes for correct placement to prevent pressure, friction and shearing   Encourage use of lotion/moisturizer on skin   Monitor patient's hygiene practices  Note: Skin assessment done. No open wounds noted. Blanchable redness on heels. Mepilex applied on both heels. Heels elevated off the bed. Non blanchable redness on sacrum. Prophylactic Mepilex applied.   Goal: Nutritional status is improving  Outcome: Progressing  Flowsheets (Taken 05/01/2022 1524)  Nutritional status is improving:   Assist patient with eating   Allow adequate time for meals   Encourage patient to take dietary supplement(s) as ordered   Collaborate with Clinical Nutritionist   Include patient/patient care companion in decisions related to nutrition  Note: Mr. Stegman has low BMI. Appetite is fair. Able to finish half of his lunch. Takes oral tablets without issues. Needs referral to Nutritionist due to low BMI and patient might benefit with dietary supplements.      Problem: Inadequate Gas Exchange  Goal: Adequate oxygenation and improved ventilation  Outcome: Progressing  Flowsheets (Taken 05/01/2022 1524)  Adequate oxygenation and improved ventilation:   Assess lung sounds   Monitor SpO2 and treat as needed   Provide mechanical and oxygen  support to facilitate gas exchange   Position for maximum ventilatory efficiency   Teach/reinforce use of incentive spirometer 10 times per hour while awake, cough and deep breath as needed   Plan activities to conserve energy: plan rest periods   Increase activity as tolerated/progressive mobility   Consult/collaborate with Respiratory Therapy  Note: Received on BiPAP, short of breath evident when talking, patient verbalized " I need to catch my breath first". Around 0900hrs, chest tube inserted by Dr. Leron Croak on R mid axillary region. Chest tube connected to dry suction of -20cmH20. Patient shifted to 02 4LPM via NC, tolerating well. Not in any respiratory distress and reported improvement of his breathing. 02 weaned down to 1LPM at present.

## 2022-05-01 NOTE — Procedures (Signed)
[name] [name] is a 71 y.o. male patient.  Principal Problem:    Acute on chronic respiratory failure, unspecified whether with hypoxia or hypercapnia    Past Medical History:   Diagnosis Date    Collapsed lung     Emphysema lung     Enlarged prostate      Blood pressure 119/70, pulse 96, temperature (!) 96.8 F (36 C), temperature source Axillary, resp. rate 17, height (!) 0.053 m (2.09"), weight (!) 30.8 kg (67 lb 14.4 oz), SpO2 97 %.    Chest Tube Insertion    Date/Time: 05/01/2022 10:48 AM    Performed by: Luz Brazen, MD  Authorized by: Luz Brazen, MD  Consent: Verbal consent obtained. Written consent obtained.  Risks and benefits: risks, benefits and alternatives were discussed  Consent given by: son.  Patient understanding: patient states understanding of the procedure being performed  Patient consent: the patient's understanding of the procedure matches consent given  Procedure consent: procedure consent matches procedure scheduled  Relevant documents: relevant documents present and verified  Test results: test results available and properly labeled  Site marked: the operative site was marked  Imaging studies: imaging studies available  Patient identity confirmed: arm band  Time out: Immediately prior to procedure a "time out" was called to verify the correct patient, procedure, equipment, support staff and site/side marked as required.  Indications: pneumothorax    Sedation:  Patient sedated: no    Anesthesia: local infiltration    Anesthesia:  Local Anesthetic: lidocaine 1% without epinephrine  Anesthetic total: 6 mL  Preparation: skin prepped with Betadine and skin prepped with ChloraPrep  Placement location: right lateral  Scalpel size: 10  Tube size ([place]): 14 Fr pigtail.  Ultrasound guidance: yes  Tension pneumothorax heard: no  Tube connected to: suction  Drainage characteristics: none.  Suture material: 2-0 silk  Dressing: 4x4 sterile gauze and petrolatum-impregnated  gauze  Post-insertion x-ray findings: tube in good position  Patient tolerance: patient tolerated the procedure well with no immediate complications      Additional comment: basilar PTX resolved.  Potential residual right apical PTX (difficult to tell due to lung scarring, etc).  No air-leak after initial air-leak (minimal), continue to maintain chest tube to -20 cmH2O.  Able to come off of BIPAP.  Minimize positive pressure as much as possible.   IS.     Luz Brazen, MD  05/01/2022

## 2022-05-01 NOTE — Nursing Progress Note (Addendum)
Assumed patient care. Received pt. From night shift, Alert and oriented x4. No complaints of pain/chest pain.   Connected to cardiac monitor in ST. HR 118. BP 172/189mmHg. O2 sat of 98% patient on BiPaP with the ff settings, Rate of 12, Fi02 40%, 10/5. [name] [name] is SOB and puffy when talking. IV cannula in situ.Son at the bedside.  Call light within reach.   Will continue to monitor.

## 2022-05-01 NOTE — Progress Notes (Signed)
05/01/22 0700   Vitals   Heart Rate (!) 134   Pulse (SpO2) (!) 134   Resp Rate 22   BP (!) 174/102   MAP (mmHg) (!) 128         Date Time: 05/01/22 7:46 AM    Patient Name: [name] [name],[name] [name]    CCU Admission Notes:    Received patient around 0645 from ED  with C/C of shortness of breath. Per RN report Afrah .   Patient AAO X 4, denies any pain and discomfort. CCU admission care protocol initiated. V/S taken, confirmed in the flowsheet. EKG rhythm showed sinus tachycardia. CHG bath completed. Patient was educated on the importance of safety, use of call bell and phone at bedside. Bed alarm activated.   Family member present at bedside, educated on the visiting hours policy.

## 2022-05-02 ENCOUNTER — Inpatient Hospital Stay: Payer: Medicare Other

## 2022-05-02 DIAGNOSIS — J9312 Secondary spontaneous pneumothorax: Secondary | ICD-10-CM

## 2022-05-02 DIAGNOSIS — R627 Adult failure to thrive: Secondary | ICD-10-CM

## 2022-05-02 DIAGNOSIS — J441 Chronic obstructive pulmonary disease with (acute) exacerbation: Secondary | ICD-10-CM

## 2022-05-02 LAB — GLUCOSE WHOLE BLOOD - POCT
Whole Blood Glucose POCT: 111 mg/dL — ABNORMAL HIGH (ref 70–100)
Whole Blood Glucose POCT: 127 mg/dL — ABNORMAL HIGH (ref 70–100)
Whole Blood Glucose POCT: 128 mg/dL — ABNORMAL HIGH (ref 70–100)
Whole Blood Glucose POCT: 129 mg/dL — ABNORMAL HIGH (ref 70–100)
Whole Blood Glucose POCT: 190 mg/dL — ABNORMAL HIGH (ref 70–100)
Whole Blood Glucose POCT: 228 mg/dL — ABNORMAL HIGH (ref 70–100)

## 2022-05-02 LAB — BASIC METABOLIC PANEL
Anion Gap: 8 (ref 5.0–15.0)
BUN: 21 mg/dL (ref 9.0–28.0)
CO2: 25 mEq/L (ref 17–29)
Calcium: 8.9 mg/dL (ref 7.9–10.2)
Chloride: 109 mEq/L (ref 99–111)
Creatinine: 0.7 mg/dL (ref 0.5–1.5)
Glucose: 133 mg/dL — ABNORMAL HIGH (ref 70–100)
Potassium: 4.3 mEq/L (ref 3.5–5.3)
Sodium: 142 mEq/L (ref 135–145)
eGFR: >60 mL/min/{1.73_m2} (ref >=60)

## 2022-05-02 LAB — CBC
Absolute NRBC: 0 10*3/uL (ref 0.00–0.00)
Hematocrit: 36.6 % — ABNORMAL LOW (ref 37.6–49.6)
Hgb: 11.2 g/dL — ABNORMAL LOW (ref 12.5–17.1)
MCH: 28.2 pg (ref 25.1–33.5)
MCHC: 30.6 g/dL — ABNORMAL LOW (ref 31.5–35.8)
MCV: 92.2 fL (ref 78.0–96.0)
MPV: 9.4 fL (ref 8.9–12.5)
Nucleated RBC: 0 /100 WBC (ref 0.0–0.0)
Platelets: 244 10*3/uL (ref 142–346)
RBC: 3.97 10*6/uL — ABNORMAL LOW (ref 4.20–5.90)
RDW: 13 % (ref 11–15)
WBC: 17.47 10*3/uL — ABNORMAL HIGH (ref 3.10–9.50)

## 2022-05-02 LAB — PHOSPHORUS: Phosphorus: 2.8 mg/dL (ref 2.3–4.7)

## 2022-05-02 LAB — MRSA CULTURE
Culture MRSA Surveillance: NEGATIVE
Culture MRSA Surveillance: NEGATIVE

## 2022-05-02 LAB — MAGNESIUM: Magnesium: 1.7 mg/dL (ref 1.6–2.6)

## 2022-05-02 NOTE — Progress Notes (Addendum)
[facility]  [facility]) Progress Note    Date / Time: 06/04/231:04 PM  Room: M605/M605-01  Patient:   [name] [name],[name] [name]  Admitted: 05/01/2022  Attending:   Luz BrazenBeyzaei Arani, Grayson Pfefferle, MD  Status: Full Code     Hospital Day /  LOS: 1 day      ICU admission/transfer date: 05/01/22     Assessment    [name] [name] is a 71 y.o. male with hx of active smoking, severe pulmonary emphysema who presented with acute onset of dyspnea overnight.  Per medic report patient's POX was 86%, tachypneic, tachycardic 130-140s.  No fevers.  Per son, he was at his baseline yesterday.     In ED, he was placed on 5 LPM via NC, subsequently switched to BIPAP 10/5, 40%.  CXR with evidence of R small to moderate PTX.  CT chest with moderate PTX, severe pulmonary bullous emphysema.   Negative troponin, negative lactate. Got nebs and iv steroids.     Does not take any medications at baseline.  Continues to smoke.          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  Off of BIPAP.  Diet started.     05/02/22:    Mostly on RA and/or 1LPM.  Chest tube without air leak, tidaling well.  AM CXR without obvious PTX.  Clamped for 2 hrs, repeat CXR shows suspected residual R apical PTX.  Chest tube unclamped.  Feels baseline from respiratory standpoint.  PCT negative, Malott'ed CTX, on azithromycin, steroids.         Plan     Critical Care services by organ system    NEURO:  # at risk for delirium  Delirium precautions including maintenance of day/night wake cycles, frequent reorientation as needed        PULM:  # AE-COPD  # R secondary spontaneous PTX in setting of severe pulmonary bullous emphysema   IV  steroids, bronchodilators, empiric abx, respiratory cx if able   Pulmicort neb bid  Chest tube to -20 cmH2O  IS  Minimize positive pressure ventilation; off of bipap now   Smoking cessation  Serial CXR  If concern remains for residual PTX on CXR tomorrow, may consider CT chest.  Patient's chest tube is tidaling well, no air-leak.  Skin fold along severe bullous disease of apex, may make interpretation difficult.  Will keep chest tube for now.   Will need outpatient pulm follow up  Consult pulmonary for chest tube management outside ICU.         CARDIAC:  # stable hemodynamics  No acute issues        GI:  # NAI  Bowel Regimen: peri colace   GI prophylaxis: n/a   Nutrition: regular diet        RENAL:  # No acute issues   Monitor lytes        HEME/ONC:  # at risk for VTE  SCDs  Pharmacologic prophylaxis: sqh      ID:  # [id number]  Empiric CTX and azithromycin (d/c ceftriaxone today 6/4).  PCT negative.   Respiratory Cx if able           ENDO/RHEUM:  # no known hx of diabetes  Glucose goal: 100-180; lispro correction if elevated in setting of iv steroids          iMED CONSENTS: view in Chart Review under Consents tab  ICU bundled consent obtained - n/a    Transfusion consent obtained - n/a        FAMILY/GOALS OF CARE: Full Code  Plan of care d/w son and patient extensively.  Code status d/w patient, at this time wishes to be full code.  Informed son that given severity of emphysema/end stage COPD, will not be able to come off the ventilator if ever needed.  Further GOC discussions as needed.  In addition, if he has cardiac arrest, in all likelihood would be due to pulmonary issues, strongly recommended against CPR.  For now, patient wishes to remain full code.  Palliative care consult appropriate.    Son informed of above discussions again and to further d/w father regarding code status.       DISPO:   ICU admission 6/3.       Checklist updated on 6/4  Last CPOT:    Last RASS: RASS Score: Alert and Calm  Delirium:  Positive or Negative for Delirium: Negative  Mobility: PMP Activity: Step 3 - Bed Mobility (05/02/2022 12:00 PM)      Analgesia & Sedation    Goals -  [] N/A [x] CPOT [x] RASS   Restraint(s) -  [] N/A [] Discontinue [] Start/Continue (risk of self-harm)   Monitor CAM-ICU -    [] N/A [] Discontinue [x] Start/Continue CAM-ICU   Activity (PT/OT) -    [] Bedrest [x] Sitting (bedside/chair) []  Ambulate       Respiratory    Intubation -  [x] N/A [] ETT [] Trach []  Mechanical Ventilation   (SAT/SBT) -  [] N/A [] No  [] Yes  [] Ready to Extubate   Tracheostomy -  [] N/A [] Yes (assessment)   Gasterointestinal    Ulcer Prophylaxis -  [x] Not indicated [] Discontinue [] Start/Continue   Bowel Regimen -  [x] Yes []  Not indicated   Rectal tube -  [x] Not indicated [] Discontinue [] Start/Continue   Hospital-Acquired Infections    BSI Prevention -  [x] N/A [] Discontinue [] Start/Continue   CAUTI Prevention -  [x] N/A [] Discontinue [] Start/Continue    Other lines/Devices -  [] N/A [x] See LDA below for further detail   Abx Stewardship -  [] N/A [] Discontinue [x] Start/Continue (see MAR)   VTE Prevention    Mechanical -   [x] SCD ordered [] SCD contraindicated   Chemical -  [x] Ppx ordered  [] Therapeutic AC []  Held/Deferred    Goals of Care    Code Status -   Full Code   POA  Identified -  [x]  Yes []  No   Family Updated -  [x]  Yes []  No         Physical Exam   BP 110/62   Pulse (!) 108   Temp 97.4 F (36.3 C) (Oral)   Resp 18   Ht 1.6 m (5' 2.99")   Wt (!) 30.8 kg (67 lb 14.4 oz)   SpO2 98%   BMI 12.03 kg/m      General: mild tachypnea, otherwise cooperative. Extremely cachectic.   Neuro: AOx3, grossly non focal motor, MAE x4  Heart: RRR  Lungs: very diminished BS bilaterally.   Abdomen: soft, NT, ND.    Extremities: no edema, thin extremities   Skin: no rash.      Data / Meds / Labs / Rads  Patient Lines/Drains/Airways Status       Active Lines, Drains and Airways       Name Placement date Placement time Site Days    Peripheral IV 05/01/22 18 G Right  Antecubital 05/01/22  0514  Antecubital  1    Peripheral IV 05/01/22 20 G Left Antecubital 05/01/22  0630  Antecubital  1                   SCHEDULED MEDICATIONS: PRN MEDS:   Current Facility-Administered Medications   Medication Dose Route Frequency    albuterol-ipratropium  3 mL Nebulization Q4H SCH    azithromycin  500 mg Intravenous Q24H    budesonide  0.5 mg Nebulization BID    cefTRIAXone  1 g Intravenous Q24H    dexAMETHasone  8 mg Intravenous Q12H SCH    heparin (porcine)  5,000 Units Subcutaneous Q8H SCH    insulin lispro  1-3 Units Subcutaneous QHS    insulin lispro  1-5 Units Subcutaneous TID AC    nicotine  1 patch Transdermal Daily    senna-docusate  1 tablet Oral Q12H SCH    acetaminophen, morphine   INFUSION MEDS:          Intake/Output Summary (Last 24 hours) at 05/02/2022 1304  Last data filed at 05/02/2022 1200  Gross per 24 hour   Intake 1774.17 ml   Output 630 ml   Net 1144.17 ml     Recent Labs     05/02/22  1040 05/01/22  0516   WBC 17.47* 9.70*   Hgb 11.2* 13.5   Hematocrit 36.6* 44.5     Recent Labs     05/02/22  1040 05/01/22  0516   Sodium 142 142   Potassium 4.3 4.2   Chloride 109 106   CO2 25 28   BUN 21.0 16.0   Creatinine 0.7 0.9   Glucose 133* 232*   Calcium 8.9 9.1   Magnesium 1.7  --    Phosphorus 2.8  --      No results for input(s): PT, INR, PTT in the last 72 hours.  XR Chest AP Portable   Final Result    Slightly improved right-sided pneumothorax. The exam is otherwise stable.      Lorenda Peck, MD   05/02/2022 11:41 AM      XR Chest AP Portable   Final Result         Stable right-sided chest tube. No evidence of residual pneumothorax.      Aldean Ast, MD   05/02/2022 6:29 AM      XR Chest AP Portable   Final Result       1. Status post chest tube placement as described. There is a suspected residual right apical pneumothorax noted.      Lorenda Peck, MD   05/01/2022 9:41 AM      CT Chest without Contrast   Final Result    Extensive emphysematous changes and parenchymal scarring. There is a  moderate size right-sided pneumothorax. The lungs however remain relatively well expanded. Continued follow-up is recommended.            Note: A combination of automatic exposure control, adjustment of the MA and/or KV according to patient size and/or use of iterative reconstructive technique was utilized.         Lorenda Peck, MD   05/01/2022 7:27 AM      XR Chest  AP Portable   Final Result  Small right pneumothorax. Emphysema.      Aldean Ast, MD   05/01/2022 5:54 AM      XR Chest AP Portable    (Results Pending)      _____________________________________________________________________    My cumulative time taking care of this critically ill patient is 40 minutes. This time is exclusive of teaching, billable procedures, and does not overlap with any other providers.  This patient has a high probability of sudden clinically significant deterioration which requires the highest level of physician preparedness to intervene urgently. I managed/supervised life or organ supporting interventions that required frequent physician assessments. I devoted my full attention to the direct care of this patient for this period of time.    Luz Brazen, MD  Critical Care Medicine

## 2022-05-02 NOTE — Plan of Care (Signed)
Problem: Compromised Tissue integrity  Goal: Damaged tissue is healing and protected  Outcome: Progressing  Flowsheets (Taken 05/02/2022 2252)  Damaged tissue is healing and protected:   Monitor/assess Braden scale every shift   Provide wound care per wound care algorithm   Reposition patient every 2 hours and as needed unless able to reposition self   Increase activity as tolerated/progressive mobility   Avoid shearing injuries   Keep intact skin clean and dry   Relieve pressure to bony prominences for patients at moderate and high risk   Monitor external devices/tubes for correct placement to prevent pressure, friction and shearing   Use bath wipes, not soap and water, for daily bathing   Use incontinence wipes for cleaning urine, stool and caustic drainage. Foley care as needed   Monitor patient's hygiene practices  Goal: Nutritional status is improving  Outcome: Progressing  Flowsheets (Taken 05/02/2022 2252)  Nutritional status is improving:   Allow adequate time for meals   Include patient/patient care companion in decisions related to nutrition     Problem: Inadequate Gas Exchange  Goal: Adequate oxygenation and improved ventilation  Outcome: Progressing  Flowsheets (Taken 05/02/2022 2252)  Adequate oxygenation and improved ventilation:   Assess lung sounds   Monitor SpO2 and treat as needed   Provide mechanical and oxygen support to facilitate gas exchange   Teach/reinforce use of incentive spirometer 10 times per hour while awake, cough and deep breath as needed   Plan activities to conserve energy: plan rest periods   Increase activity as tolerated/progressive mobility  Goal: Patent Airway maintained  Outcome: Progressing  Flowsheets (Taken 05/02/2022 2252)  Patent airway maintained:   Position patient for maximum ventilatory efficiency   Provide adequate fluid intake to liquefy secretions   Reinforce use of ordered respiratory interventions (i.e. CPAP, BiPAP, Incentive Spirometer, Acapella, etc.)   Reposition  patient every 2 hours and as needed unless able to self-reposition     Problem: Nutrition  Goal: Nutritional intake is adequate  Outcome: Progressing  Flowsheets (Taken 05/02/2022 2252)  Nutritional intake is adequate:   Assist patient with meals/food selection   Allow adequate time for meals   Assess anorexia, appetite, and amount of meal/food tolerated     Problem: Moderate/High Fall Risk Score >5  Goal: Patient will remain free of falls  Outcome: Progressing  Flowsheets (Taken 05/02/2022 2200)  High (Greater than 13): HIGH-Bed alarm on at all times while patient in bed

## 2022-05-02 NOTE — Progress Notes (Signed)
MEDICINE PROGRESS NOTE  [facility], DIVISION OF HOSPITALIST MEDICINE   [facility]   [place] Pager: [phone]      Date Time: 05/02/22 5:55 PM  Patient Name: [name] [name],[name] [name]  Attending Physician: Luz Brazen, MD  Hospital Day: 2  Assessment:     37 y old male who is active smoker with severe emphysema and presented on 6/3 with SOB and was found to have O2 sat in 86% by EMS and tachycardic and tachypneic and in ED was placed on BiPAP and CXR showed right small to moderate PTX and CT chest showed moderate PTX, s/p right Chest tube placement on 6/3 and is being down graded to medical floor.        Plan:     Acute COPD exacerbation/severe emphysema  Acute hypoxemic resp failure  Right sided pneumothorax s/p chest tube placement on 6/3  -c/w Duoneb,decadron 8 mg IV q12h, pulmicort IV bid  -c/w azithromycin  -c/w chest tube  -repeat CXR in am  -consult pulm in am for CT management  -Is use was encouraged  -currently on 2 lits O2 via NC and O2 sat 96%  -smoking cessation was encouraged  -start nicotine patch       Sinus tachycardia   -due to COPD exacerbation      Leucocytosis  -WB 17.47 likely due to steroid      Hyperglycemia due to steroid  -check A1C  -c/w SSI for coverage        Cachexia/underweight/sever malnutrition  -will consult nutritionist    Palliative care consult      DVT EVO:[id number]    I spent 35 minutes on the unit in direct care for this patient  > 50% in counseling and coordination of care for conditions described in my assessment and plan.       Case was discussed with pt and RN and critical care attending and pt's daughter in law      Lines:     Patient Lines/Drains/Airways Status       Active PICC Line / CVC Line / PIV Line / Drain / Airway / Intraosseous Line / Epidural Line / ART Line / Line / Wound / Pressure Ulcer / NG/OG Tube       Name Placement date Placement time Site Days    Peripheral IV 05/01/22 18 G Right Antecubital 05/01/22  0514  Antecubital  1    Peripheral  IV 05/01/22 20 G Left Antecubital 05/01/22  0630  Antecubital  1                       Subjective/ROS/24 hr events:     CC:SOB,pneumothorax     Pt seen and examined at bedside in presence of RN  Denies any CP,SOB is better, no fever,no abd pain,no N/V/D        Physical Exam:     Temp:  [96.6 F (35.9 C)-97.8 F (36.6 C)] 97.4 F (36.3 C)  Heart Rate:  [87-120] 113  Resp Rate:  [14-24] 19  BP: (90-132)/(53-70) 130/68  FiO2:  [35 %] 35 %  Intake and Output Summary-last 24 Hrs:  I/O last 3 completed shifts:  In: 704.17 [P.O.:450; IV Piggyback:254.17]  Out: 250 [Urine:250]    O2 Flow Rate (L/min)  Avg: 2 L/min  Min: 2 L/min  Max: 2 L/min by O2 Device: Nasal cannula     General: AAOX3,on 2 lit O2 via NC,cachectic, in no acute distress,on  Cardiovascular: tachycardia  Lungs: globally reduced air entry b/l lung,+chest tube right lateral chest  Abdomen: soft, non-tender, non-distended; normoactive bowel sounds  Extremities: no edema  Neurological: Alert and oriented X 3, moves all extremities.   Skin:no rash or lesion  Foley: none  Central line:none       Meds:   Medications were reviewed:  Scheduled Meds:  Current Facility-Administered Medications   Medication Dose Route Frequency    albuterol-ipratropium  3 mL Nebulization Q4H SCH    azithromycin  500 mg Intravenous Q24H    budesonide  0.5 mg Nebulization BID    dexAMETHasone  8 mg Intravenous Q12H SCH    heparin (porcine)  5,000 Units Subcutaneous Q8H SCH    insulin lispro  1-3 Units Subcutaneous QHS    insulin lispro  1-5 Units Subcutaneous TID AC    nicotine  1 patch Transdermal Daily    senna-docusate  1 tablet Oral Q12H SCH     Continuous Infusions:  PRN Meds:.acetaminophen, morphine  Labs/Radiology:   Imaging personally reviewed, including: all available   XR Chest AP Portable    Result Date: 05/02/2022   Slightly improved right-sided pneumothorax. The exam is otherwise stable. Lorenda Peck, MD 05/02/2022 11:41 AM    XR Chest AP Portable    Result Date:  05/02/2022  Stable right-sided chest tube. No evidence of residual pneumothorax. Aldean Ast, MD 05/02/2022 6:29 AM     Recent Labs     05/02/22  1545 05/02/22  1152 05/02/22  0759 05/02/22  0541 05/01/22  2357 05/01/22  1954   Whole Blood Glucose POCT 228* 111* 127* 128* 129* 251*     Recent Labs   Lab 05/02/22  1040 05/01/22  0516   Sodium 142 142   Potassium 4.3 4.2   Chloride 109 106   BUN 21.0 16.0   Creatinine 0.7 0.9   eGFR >60.0 >60.0   Glucose 133* 232*   Calcium 8.9 9.1     Recent Labs   Lab 05/02/22  1040 05/01/22  0516   WBC 17.47* 9.70*   Hgb 11.2* 13.5   Hematocrit 36.6* 44.5   Platelets 244 330         Recent Labs   Lab 05/01/22  0516   Alkaline Phosphatase 86   Bilirubin, Total 0.3   ALT 17   AST (SGOT) 30     Recent Labs   Lab 05/01/22  0516   Procalcitonin 0.03         Signed by: Richarda Osmond, MD, FACP

## 2022-05-02 NOTE — Progress Notes (Signed)
Critical Care Medicine Brief Note    08:49:  Lung up.  No air-leak.  Chest tube clamped.  Will check CXR in 2 hrs if no recurrence, will d/c chest tube.   Repeat CXR at 11 am.       Luz Brazen, MD  Critical Care Medicine  Spectralink [phone]

## 2022-05-02 NOTE — Progress Notes (Signed)
RT placed pt on BiPap ~2345.  ~0000 pt requested to remove mask stating that it was uncomfortable. RT made aware. Pt placed back on 2L O2 NC by this RN.  O2 sats remain >96%.

## 2022-05-02 NOTE — Progress Notes (Signed)
Patient received in bed with daughter at bed side, Patient on 2 LPM oxygen per nasal cannula & right chest tube connected to low wall continuous suction. Patient alert & oriented & runs sinus tachy cardia on the monitor at 120's. Charge RN informed Patient & family regarding transfer of Patient to 627-1.  Telephone report given to receiving RN, Jomarie Longs at about 2015. Patient transferred to 627-1  per hospital bed accompanied by Charge RN, Clinical Technician & daughter at about 2120. Patient transferred with documented personal belongings of upper denture, sweater, cell phone & tablet with charger.

## 2022-05-02 NOTE — Plan of Care (Signed)
Pt axox4. Family at bedside. Pt able to make needs known. C/o mild pain on chest tube site, will continue to reassess and manage pain as needed. SR-STon the monitor, HR 90s-100s. Pt on 2L O2 NC, o2 sats remain >96%. All other VSS. Pt in no other distress thus far. Pt repositioned. Chest tube setting on continuous low wall suction -20cmH2o per order, minimal sanguinous output - unable to measure estimate <5cc. Pt reports no respiratory distress thus far. Pt on q4h blood sugar . Pt and pt's family updated with plan of care and verbalized understanding.   Problem: Inadequate Gas Exchange  Goal: Adequate oxygenation and improved ventilation  Outcome: Progressing  Flowsheets   Adequate oxygenation and improved ventilation:   Assess lung sounds   Monitor SpO2 and treat as needed   Provide mechanical and oxygen support to facilitate gas exchange   Position for maximum ventilatory efficiency   Teach/reinforce use of incentive spirometer 10 times per hour while awake, cough and deep breath as needed   Plan activities to conserve energy: plan rest periods   Increase activity as tolerated/progressive mobility   Consult/collaborate with Respiratory Therapy     Problem: Moderate/High Fall Risk Score >5  Goal: Patient will remain free of falls  Outcome: Progressing  Flowsheets   Moderate Risk (6-13):   LOW-Fall Interventions Appropriate for Low Fall Risk   MOD-include family in multidisciplinary POC discussions

## 2022-05-02 NOTE — Plan of Care (Signed)
Pt axox4. Family at bedside. Pt able to make needs known. C/o mild-moderate pain on chest tube insertion site, q4h prn morphine 1mg  administered x1 thus far per pt request, effective with pain mgt, will continue to reassess and manage pain as needed. SR-Stachy on the monitor, HR 90s-100s. Pt on 2L O2 NC, o2 sats remain >96%. All other VSS. Pt in no other distress thus far. Pt repositioned. Chest tube setting on continuous suction -20cmH2o per order, minimal sanguinous output - unable to measure estimate <5cc. Pt reports no respiratory distress thus far. Pt on q4h blood sugar checks, covered x1 thus far this pm shift for BG of 251. Pt and pt's family updated with plan of care and verbalized understanding.   Problem: Inadequate Gas Exchange  Goal: Adequate oxygenation and improved ventilation  Outcome: Progressing  Flowsheets (Taken 05/02/2022 0056)  Adequate oxygenation and improved ventilation:   Assess lung sounds   Monitor SpO2 and treat as needed   Provide mechanical and oxygen support to facilitate gas exchange   Position for maximum ventilatory efficiency   Teach/reinforce use of incentive spirometer 10 times per hour while awake, cough and deep breath as needed   Plan activities to conserve energy: plan rest periods   Increase activity as tolerated/progressive mobility   Consult/collaborate with Respiratory Therapy     Problem: Moderate/High Fall Risk Score >5  Goal: Patient will remain free of falls  Outcome: Progressing  Flowsheets (Taken 05/01/2022 2000)  Moderate Risk (6-13):   LOW-Fall Interventions Appropriate for Low Fall Risk   MOD-include family in multidisciplinary POC discussions

## 2022-05-03 ENCOUNTER — Inpatient Hospital Stay: Payer: Medicare Other

## 2022-05-03 DIAGNOSIS — J9601 Acute respiratory failure with hypoxia: Secondary | ICD-10-CM

## 2022-05-03 DIAGNOSIS — J9311 Primary spontaneous pneumothorax: Secondary | ICD-10-CM

## 2022-05-03 DIAGNOSIS — J439 Emphysema, unspecified: Secondary | ICD-10-CM

## 2022-05-03 DIAGNOSIS — D72829 Elevated white blood cell count, unspecified: Secondary | ICD-10-CM

## 2022-05-03 DIAGNOSIS — R64 Cachexia: Secondary | ICD-10-CM

## 2022-05-03 LAB — GLUCOSE WHOLE BLOOD - POCT
Whole Blood Glucose POCT: 117 mg/dL — ABNORMAL HIGH (ref 70–100)
Whole Blood Glucose POCT: 147 mg/dL — ABNORMAL HIGH (ref 70–100)
Whole Blood Glucose POCT: 177 mg/dL — ABNORMAL HIGH (ref 70–100)
Whole Blood Glucose POCT: 125 mg/dL — ABNORMAL HIGH (ref 70–100)

## 2022-05-03 LAB — CBC
Absolute NRBC: 0 10*3/uL (ref 0.00–0.00)
Hematocrit: 35.5 % — ABNORMAL LOW (ref 37.6–49.6)
Hgb: 10.9 g/dL — ABNORMAL LOW (ref 12.5–17.1)
MCH: 28.1 pg (ref 25.1–33.5)
MCHC: 30.7 g/dL — ABNORMAL LOW (ref 31.5–35.8)
MCV: 91.5 fL (ref 78.0–96.0)
MPV: 9.6 fL (ref 8.9–12.5)
Nucleated RBC: 0 /100 WBC (ref 0.0–0.0)
Platelets: 259 10*3/uL (ref 142–346)
RBC: 3.88 10*6/uL — ABNORMAL LOW (ref 4.20–5.90)
RDW: 13 % (ref 11–15)
WBC: 19.44 10*3/uL — ABNORMAL HIGH (ref 3.10–9.50)

## 2022-05-03 LAB — BASIC METABOLIC PANEL
Anion Gap: 5 (ref 5.0–15.0)
BUN: 22 mg/dL (ref 9.0–28.0)
CO2: 30 mEq/L — ABNORMAL HIGH (ref 17–29)
Calcium: 8.8 mg/dL (ref 7.9–10.2)
Chloride: 109 mEq/L (ref 99–111)
Creatinine: 0.7 mg/dL (ref 0.5–1.5)
Glucose: 116 mg/dL — ABNORMAL HIGH (ref 70–100)
Potassium: 4.5 mEq/L (ref 3.5–5.3)
Sodium: 144 mEq/L (ref 135–145)
eGFR: >60 mL/min/{1.73_m2} (ref >=60)

## 2022-05-03 LAB — PHOSPHORUS: Phosphorus: 3.2 mg/dL (ref 2.3–4.7)

## 2022-05-03 LAB — MAGNESIUM: Magnesium: 1.8 mg/dL (ref 1.6–2.6)

## 2022-05-03 LAB — HEMOGLOBIN A1C
Average Estimated Glucose: 111.2 mg/dL
Hemoglobin A1C: 5.5 % (ref 4.6–5.6)

## 2022-05-03 MED ORDER — AZITHROMYCIN 250 MG PO TABS
250.0000 mg | ORAL_TABLET | Freq: Every day | ORAL | Status: AC
Start: 2022-05-04 — End: 2022-05-05
  Administered 2022-05-04 – 2022-05-05 (×2): 250 mg via ORAL
  Filled 2022-05-03 (×2): qty 1

## 2022-05-03 MED ORDER — DEXAMETHASONE SODIUM PHOSPHATE 4 MG/ML IJ SOLN (WRAP)
8.0000 mg | Freq: Two times a day (BID) | INTRAMUSCULAR | Status: AC
Start: 2022-05-03 — End: 2022-05-03
  Administered 2022-05-03: 8 mg via INTRAVENOUS
  Filled 2022-05-03: qty 5

## 2022-05-03 MED ORDER — PREDNISONE 20 MG PO TABS
60.0000 mg | ORAL_TABLET | Freq: Every morning | ORAL | Status: DC
Start: 2022-05-04 — End: 2022-05-07
  Administered 2022-05-04 – 2022-05-07 (×4): 60 mg via ORAL
  Filled 2022-05-03 (×4): qty 3

## 2022-05-03 MED ORDER — PREDNISONE 20 MG PO TABS
40.0000 mg | ORAL_TABLET | Freq: Every morning | ORAL | Status: DC
Start: 2022-05-04 — End: 2022-05-03

## 2022-05-03 MED ORDER — FLUTICASONE FUROATE-VILANTEROL 100-25 MCG/ACT IN AEPB
1.0000 | INHALATION_SPRAY | Freq: Every morning | RESPIRATORY_TRACT | Status: DC
Start: 2022-05-03 — End: 2022-05-03

## 2022-05-03 MED ORDER — FLUTICASONE FUROATE-VILANTEROL 100-25 MCG/ACT IN AEPB
1.0000 | INHALATION_SPRAY | Freq: Every morning | RESPIRATORY_TRACT | Status: DC
Start: 2022-05-04 — End: 2022-05-07
  Administered 2022-05-05 – 2022-05-07 (×3): 1 via RESPIRATORY_TRACT
  Filled 2022-05-03: qty 14

## 2022-05-03 NOTE — Progress Notes (Signed)
PULSE OXIMETRY TESTING     Patient's SpO2 at rest:   98% on 3 LPM via NC      Patient's SpO2 with exertion (ambulate 100 ft w/ walker):    95% on 2 LPM via NC  HR elevated to 130s                05/03/22 1740 05/03/22 1744   Vital Signs   Patient Position Lying Walking   Oxygen Therapy   SpO2 98 % 95 %   O2 Device Nasal cannula Nasal cannula   O2 Flow Rate (L/min) 3 L/min 2 L/min

## 2022-05-03 NOTE — Plan of Care (Signed)
Problem: Compromised Tissue integrity  Goal: Damaged tissue is healing and protected  Outcome: Progressing  Flowsheets (Taken 05/03/2022 0945)  Damaged tissue is healing and protected:   Monitor/assess Braden scale every shift   Provide wound care per wound care algorithm   Reposition patient every 2 hours and as needed unless able to reposition self   Increase activity as tolerated/progressive mobility   Avoid shearing injuries   Relieve pressure to bony prominences for patients at moderate and high risk   Use bath wipes, not soap and water, for daily bathing   Keep intact skin clean and dry   Use incontinence wipes for cleaning urine, stool and caustic drainage. Foley care as needed   Monitor external devices/tubes for correct placement to prevent pressure, friction and shearing     Problem: Inadequate Gas Exchange  Goal: Adequate oxygenation and improved ventilation  Outcome: Progressing  Flowsheets (Taken 05/03/2022 0945)  Adequate oxygenation and improved ventilation:   Assess lung sounds   Monitor SpO2 and treat as needed   Monitor and treat ETCO2   Position for maximum ventilatory efficiency   Provide mechanical and oxygen support to facilitate gas exchange   Teach/reinforce use of incentive spirometer 10 times per hour while awake, cough and deep breath as needed   Plan activities to conserve energy: plan rest periods   Increase activity as tolerated/progressive mobility   Consult/collaborate with Respiratory Therapy     Problem: Nutrition  Goal: Nutritional intake is adequate  Outcome: Progressing  Flowsheets (Taken 05/03/2022 0945)  Nutritional intake is adequate:   Monitor daily weights   Assist patient with meals/food selection   Allow adequate time for meals   Encourage/perform oral hygiene as appropriate   Encourage/administer dietary supplements as ordered (i.e. tube feed, TPN, oral, OGT/NGT, supplements)   Consult/collaborate with Clinical Nutritionist   Include patient/patient care companion in  decisions related to nutrition   Assess anorexia, appetite, and amount of meal/food tolerated   Consult/collaborate with Speech Therapy (swallow evaluations)     Problem: Moderate/High Fall Risk Score >5  Goal: Patient will remain free of falls  Outcome: Progressing

## 2022-05-03 NOTE — Consults (Signed)
[facility]  INITIAL PULMONARY CONSULTATION  [employer] [employer] Text "[facility]"   [phone] (MD Spectralink #1) / [phone] (MD Spectralink #2) / [phone] (PA Spectralink)      Date Time: 05/03/22 11:25 AM  Patient Name: [name] [name],[name] [name]  Requesting Physician: Richarda Osmond, MD  Consulting Physician: Loman Chroman, MD  Outpatient Pulmonologist (if known):        Reason for Consultation:   PTX      Assessment:     Patient Active Problem List   Diagnosis    Bladder stone    Urinary retention    Benign prostatic hyperplasia with lower urinary tract symptoms    Right kidney stone    Acute on chronic respiratory failure, unspecified whether with hypoxia or hypercapnia       This is a 71 yo male with    COPD with exacerbation  R PTX  S/p chest tube  No PTX on CXR 6/5  Active tobacco use              Recommendations:   OK to d/c chest tube  Can change to prednisone 60 qd  Start Breo 100 qd  Continue to Duonebs  Wean O2        The case was discussed with pt and daughter at baseline.    The patient presents complex issues, as noted above, and requires continued hospitalization and monitoring given the risk of deterioration.        Thank you for allowing [place] to participate in the care of this patient. We will follow along with you.         History:   Reef Achterberg is a 71 y.o. male with hx COPD, emphysema, active tobacco admitted on 05/01/2022.  We have been asked by Richarda Osmond, MD, to provide pulmonary consultation regarding COPD exacerbation.    The patient was admitted with a COPD exacerbation to the ICU on 6/3.  He was found to have a R PTX with CT chest showing severe bullous emphysema.  Chest tube was placed on 6/3.  CXR today shows no PTX.    He is on Duo nebs, Pulmicort nebs, azithro, and dexamethasone.    The patient has COPD but has not been taking inhalers regularly at home per daughter.  He is not on O2.  He has not seen a pulmonologist previously.          Past Medical History:      Past Medical History:   Diagnosis Date    Collapsed lung     Emphysema lung     Enlarged prostate        Past Surgical History:     Past Surgical History:   Procedure Laterality Date    CYSTOSCOPY, LITHOLAPAXY N/A 01/04/2020    Procedure: Gabriel Rainwater;  Surgeon: Miquel Dunn, MD;  Location: [facility];  Service: Urology;  Laterality: N/A;  CYSTOLITHOLAPAXY    PROSTATE SURGERY         Family History:   No family history on file.    Social History:     Social History     Socioeconomic History    Marital status: Unknown     Spouse name: Not on file    Number of children: Not on file    Years of education: Not on file    Highest education level: Not on file   Occupational History    Not on file   Tobacco  Use    Smoking status: Every Day     Years: 50.00     Types: Cigarettes    Smokeless tobacco: Never   Vaping Use    Vaping status: Never Used   Substance and Sexual Activity    Alcohol use: Never    Drug use: Never    Sexual activity: Not on file   Other Topics Concern    Not on file   Social History Narrative    Not on file     Social Determinants of Health     Financial Resource Strain: Not on file   Food Insecurity: Not on file   Transportation Needs: Not on file   Physical Activity: Not on file   Stress: Not on file   Social Connections: Not on file   Intimate Partner Violence: Not on file   Housing Stability: Not on file       Allergies:   No Known Allergies    Medications:     Medications Prior to Admission   Medication Sig    oxyCODONE (ROXICODONE) 5 MG immediate release tablet Take 1 tablet (5 mg total) by mouth every 4 (four) hours as needed for Pain    phenazopyridine (PYRIDIUM) 200 MG tablet Take 1 tablet (200 mg total) by mouth 3 (three) times daily as needed for Pain        Current Facility-Administered Medications   Medication Dose Route Frequency    albuterol-ipratropium  3 mL Nebulization Q4H SCH    azithromycin  500 mg Intravenous Q24H    budesonide  0.5 mg Nebulization BID     dexAMETHasone  8 mg Intravenous Q12H SCH    heparin (porcine)  5,000 Units Subcutaneous Q8H SCH    insulin lispro  1-3 Units Subcutaneous QHS    insulin lispro  1-5 Units Subcutaneous TID AC    nicotine  1 patch Transdermal Daily    senna-docusate  1 tablet Oral Q12H SCH         Review of Systems:    Comprehensive review of systems including constitutional, eyes, ears, nose, mouth, throat, cardiovascular, GI, GU, musculoskeletal, integumentary, respiratory, neurologic, psychiatric, and endocrine is negative other than what is mentioned already in the history of present illness    Physical Exam:     VITAL SIGNS PHYSICAL EXAM   Vitals:    05/03/22 0744   BP: 132/69   Pulse: (!) 104   Resp: 16   Temp: 97.9 F (36.6 C)   SpO2: 99%     Temp (24hrs), Avg:97.6 F (36.4 C), Min:97.4 F (36.3 C), Max:97.9 F (36.6 C)      Intake and Output Summary (Last 24 hours) at Date Time    Intake/Output Summary (Last 24 hours) at 05/03/2022 1125  Last data filed at 05/03/2022 0932  Gross per 24 hour   Intake 1070 ml   Output 700 ml   Net 370 ml           SpO2: 99 % (05/03/2022  7:44 AM)  O2 Device: Nasal cannula (05/03/2022  7:44 AM)  FiO2: 35 % (05/02/2022  6:00 PM)  O2 Flow Rate (L/min): 3 L/min (05/03/2022  7:44 AM)     Physical Exam  General: awake, alert, breathing comfortably, no acute distress  Head: normocephalic  Eyes: EOM's intact  Cardiovascular: regular rate and rhythm, normal S1, S2, no S3, no S4, no murmurs, rubs or gallops  Neck: No JVD  Lungs: decreased BS  Abdomen: soft, non-tender, non-distended; no  palpable masses,  normoactive bowel sounds  Extremities: no edema  Pulse: equal pulses,  Neurological: Alert and oriented X3, mood and affect normal  Musculoskeletal: normal strength and tone     Labs Reviewed:     Results       Procedure Component Value Units Date/Time    Culture Blood Aerobic and Anaerobic [[id number]] Collected: 05/01/22 0520    Specimen: Blood, Venipuncture Updated: 05/03/22 1121    Narrative:      ORDER#:  [id number]                                    ORDERED BY: HOLLIDAY, NICOL  SOURCE: Blood, Venipuncture arm                      COLLECTED:  05/01/22 05:20  ANTIBIOTICS AT COLL.:                                RECEIVED :  05/01/22 10:34  Culture Blood Aerobic and Anaerobic        PRELIM      05/03/22 11:21  05/02/22   No Growth after 1 day/s of incubation.  05/03/22   No Growth after 2 day/s of incubation.      Culture Blood Aerobic and Anaerobic [[id number]] Collected: 05/01/22 0520    Specimen: Blood, Intravenous Line Updated: 05/03/22 1121    Narrative:      Peripheral Line Draw.  ORDER#: [id number]                                    ORDERED BY: HOLLIDAY, NICOL  SOURCE: Blood, Intravenous Line PIV                  COLLECTED:  05/01/22 05:20  ANTIBIOTICS AT COLL.:                                RECEIVED :  05/01/22 10:34  Culture Blood Aerobic and Anaerobic        PRELIM      05/03/22 11:21  05/02/22   No Growth after 1 day/s of incubation.  05/03/22   No Growth after 2 day/s of incubation.      Hemoglobin A1C [[id number]] Collected: 05/03/22 0611    Specimen: Blood Updated: 05/03/22 0930     Hemoglobin A1C 5.5 %      Average Estimated Glucose 111.2 mg/dL     Narrative:      This is NOT the correct Test for Patients with  Hemoglobinopathy.    Basic Metabolic Panel [[id number]]  (Abnormal) Collected: 05/03/22 0611    Specimen: Blood Updated: 05/03/22 0736     Glucose 116 mg/dL      BUN 74.2 mg/dL      Creatinine 0.7 mg/dL      Calcium 8.8 mg/dL      Sodium 595 mEq/L      Potassium 4.5 mEq/L      Chloride 109 mEq/L      CO2 30 mEq/L      Anion Gap 5.0     eGFR >60.0 mL/min/1.73 m2     Magnesium [[id number]] Collected: 05/03/22 0611    Specimen: Blood Updated: 05/03/22 0736  Magnesium 1.8 mg/dL     Phosphorus [[id number]] Collected: 05/03/22 0611    Specimen: Blood Updated: 05/03/22 0736     Phosphorus 3.2 mg/dL     CBC without differential [[id number]]  (Abnormal) Collected: 05/03/22 0611    Specimen: Blood Updated:  05/03/22 0715     WBC 19.44 x10 3/uL      Hgb 10.9 g/dL      Hematocrit 37.3 %      Platelets 259 x10 3/uL      RBC 3.88 x10 6/uL      MCV 91.5 fL      MCH 28.1 pg      MCHC 30.7 g/dL      RDW 13 %      MPV 9.6 fL      Nucleated RBC 0.0 /100 WBC      Absolute NRBC 0.00 x10 3/uL     Glucose Whole Blood - POCT [[id number]]  (Abnormal) Collected: 05/03/22 0601     Updated: 05/03/22 0612     Whole Blood Glucose POCT 117 mg/dL     Glucose Whole Blood - POCT [[id number]]  (Abnormal) Collected: 05/02/22 1957     Updated: 05/02/22 2014     Whole Blood Glucose POCT 190 mg/dL     Glucose Whole Blood - POCT [[id number]]  (Abnormal) Collected: 05/02/22 1545     Updated: 05/02/22 1617     Whole Blood Glucose POCT 228 mg/dL     MRSA culture [[id number]] Collected: 05/01/22 0946    Specimen: Culturette from Throat Updated: 05/02/22 1257     Culture MRSA Surveillance Negative for Methicillin Resistant Staph aureus    MRSA culture [[id number]] Collected: 05/01/22 0944    Specimen: Culturette from Nasal Swab Updated: 05/02/22 1257     Culture MRSA Surveillance Negative for Methicillin Resistant Staph aureus    Basic Metabolic Panel [[id number]]  (Abnormal) Collected: 05/02/22 1040    Specimen: Blood Updated: 05/02/22 1203     Glucose 133 mg/dL      BUN 45.0 mg/dL      Creatinine 0.7 mg/dL      Calcium 8.9 mg/dL      Sodium 388 mEq/L      Potassium 4.3 mEq/L      Chloride 109 mEq/L      CO2 25 mEq/L      Anion Gap 8.0     eGFR >60.0 mL/min/1.73 m2     Magnesium [[id number]] Collected: 05/02/22 1040    Specimen: Blood Updated: 05/02/22 1203     Magnesium 1.7 mg/dL     Phosphorus [[id number]] Collected: 05/02/22 1040    Specimen: Blood Updated: 05/02/22 1203     Phosphorus 2.8 mg/dL     Glucose Whole Blood - POCT [[id number]]  (Abnormal) Collected: 05/02/22 1152     Updated: 05/02/22 1157     Whole Blood Glucose POCT 111 mg/dL     CBC without differential [[id number]]  (Abnormal) Collected: 05/02/22 1040    Specimen: Blood Updated: 05/02/22 1138      WBC 17.47 x10 3/uL      Hgb 11.2 g/dL      Hematocrit 75.4 %      Platelets 244 x10 3/uL      RBC 3.97 x10 6/uL      MCV 92.2 fL      MCH 28.2 pg      MCHC 30.6 g/dL      RDW 13 %      MPV 9.4 fL  Nucleated RBC 0.0 /100 WBC      Absolute NRBC 0.00 x10 3/uL             I personally reviewed the labs         Imaging:     Outside imaging and imaging available in Epic were directly reviewed        Radiology Results (24 Hour)       Procedure Component Value Units Date/Time    XR Chest AP Portable [[id number]][[id number]] Collected: 05/03/22 0434    Order Status: Completed Updated: 05/03/22 0439    Narrative:      HISTORY: Follow-up right-sided pneumothorax    EXAMINATION: Portable frontal view of chest    COMPARISON: Radiograph 05/02/2022 and CT 05/01/2022    FINDINGS: Stable position of right-sided pleural catheter with tip overlying the lateral right upper hemithorax. No radiographic evidence of pneumothorax at this time. Cardiac silhouette is stable. Persistent extensive bullous disease in the lower two   thirds of the left chest with scarring in the left upper hemithorax.       Impression:       Stable position of right-sided pleural catheter without radiographic evidence of pneumothorax. Remainder as above.    Ivin PootJennifer Park, MD  05/03/2022 4:37 AM    XR Chest AP Portable [[id number]][[id number]] Collected: 05/02/22 1140    Order Status: Completed Updated: 05/02/22 1143    Narrative:      INDICATION:  Pneumothorax follow-up    TECHNIQUE:  AP portable chest time 1112 hours    COMPARISON:05/02/2022    FINDINGS:     There are again identified to be changes of a right pleural tube. There is identification of a persistent right sided apical pneumothorax. This has slightly improved as compared to the prior study. There is some persistent but diminishing subcutaneous   emphysema noted.    The left hemithorax remains stable.      Impression:       Slightly improved right-sided pneumothorax. The exam is otherwise stable.    Lorenda PeckMark Lopiano,  MD  05/02/2022 11:41 AM                    Signed by: Loman ChromanMark J Georga Stys, MD      [facility]  [facility] Text "[facility]"   Office [phone]

## 2022-05-03 NOTE — Consults (Signed)
Nutrition Support Services  [facility]   Main Office Telephone: [phone]    Nutrition Assessment    [name] KatayamaBinh [name] 71 y.o. male   MRN: [id number]    Summary of Nutrition Recommendations:  Continue current diet  Add ensure hi plus hi protein TID  RN to document daily % po intake    -----------------------------------------------------------------------------------------------------------------    Assessment Data   Reason for Assessment: Consult for low BMI    Subjective Nutrition:  Pt met at bedside with daughter. Reporting good appetite, eating 75% of meals. Daughter reported pt eats one meal a day at home with snacks throughout the day, pt has been losing wt for a while, unsure of amount. Pt does drink ensure at home, will add to meals.   No wt history in chart. NFPE completed, pt with severe muscle and fat loss noted.     Learning Needs:  none at this time    Events of Current Admission:  [age] y old male who is active smoker with severe emphysema and presented on 6/3 with SOB and was found to have O2 sat in 86% by EMS and tachycardic and tachypneic and in ED was placed on BiPAP and CXR showed right small to moderate PTX and CT chest showed moderate PTX, s/p right Chest tube placement on 6/3 and was down graded to medical floor on 6/4.    Medical Hx:  has a past medical history of Collapsed lung, Emphysema lung, and Enlarged prostate.    PSH: has a past surgical history that includes Prostate surgery and CYSTOSCOPY, LITHOLAPAXY (N/A, 01/04/2020).     Wt Readings from Last 30 Encounters:   05/02/22 (!) 30.8 kg (67 lb 14.4 oz)   12/28/19 (!) 35 kg (77 lb 2.6 oz)   12/28/19 (!) 36.4 kg (80 lb 4 oz)       Weight Hx Summary:     ANTHROPOMETRIC  Height: 157.5 cm (5\' 2" )  Weight: (!) 30.8 kg (67 lb 14.4 oz) (PER RECORD)     Weight Change: 0  IBW/kg (Calculated) Male: 53.64 kg  IBW/kg (Calculated) Male: 50 kg        Body mass index is 12.42 kg/m.     Social History     Socioeconomic History    Marital status:  Unknown   Tobacco Use    Smoking status: Every Day     Years: 50.00     Types: Cigarettes    Smokeless tobacco: Never   Vaping Use    Vaping status: Never Used   Substance and Sexual Activity    Alcohol use: Never    Drug use: Never       Active Hospital Problems    Diagnosis    Acute on chronic respiratory failure, unspecified whether with hypoxia or hypercapnia     No Known Allergies    GI Symptoms: good appetite  Skin: n/a per documentation  NFPE:  Head: temple region: hollowing, scooping, depression with little to no muscle tone/resistance (severe muscle loss - temporalis), orbital region: slightly dark circles, somewhat hollow look, some decrease in bounce back of fat pads (moderate fat loss), and buccal region: hollow, sunken, narrow cheeks, prominence of bony structure, minimal to no bounce back of fat pads (severe fat loss)  Upper Body: clavicle bone region: protruding, prominent bone, no bounce back in muscle tone/resistance with striated/stringy feel, fingers able to palpate under clavicle (severe muscle loss - pectoralis major), shoulder and acromion bone region: prominent protrusion of acromion  process and bones, squared off appearance of shoulders, minimal to no muscle tone/resistance (severe muscle loss - deltoid), upper arm region: fingers touch with minimal fat, very little space between (severe fat loss), dorsal hand region: deep depression, minimal to no muscle tone/resistance (severe muscle loss - interosseous), and scapular bone region: prominent, visible bones, depressions around scapula bone with minimal to no resistance in muscles (severe muscle loss - trapezius, supraspinatus, infraspinatus)  Lower Body: anterior thigh and patellar region: noticeable depressions along the thigh, patella prominent, square appearance, minimal to no muscle tone/resistance in quadriceps to patella (severe muscle loss - quadriceps), posterior calf region: absence of bulb shape, minimal to no muscle tone/resistance  (severe muscle loss - gastrocnemius), and edema: no sign of fluid accumulation    Orders Placed This Encounter   Procedures    Diet regular    Ensure Plus Hi Protein Supplement Quantity: A. One; Frequency: Once With lunch        Current Meds:  albuterol-ipratropium, 3 mL, Q4H SCH  [START ON 05/04/2022] azithromycin, 250 mg, Daily  budesonide, 0.5 mg, BID  dexAMETHasone, 8 mg, Q12H SCH  heparin (porcine), 5,000 Units, Q8H SCH  insulin lispro, 1-3 Units, QHS  insulin lispro, 1-5 Units, TID AC  nicotine, 1 patch, Daily  [START ON 05/04/2022] predniSONE, 40 mg, QAM W/BREAKFAST  senna-docusate, 1 tablet, Q12H [facility]          Recent Labs:  Recent Labs   Lab 05/03/22  0611 05/02/22  1040 05/01/22  0516   WBC 19.44* 17.47* 9.70*   Hgb 10.9* 11.2* 13.5   Hematocrit 35.5* 36.6* 44.5   MCV 91.5 92.2 92.3   Platelets 259 244 330     Recent Labs   Lab 05/03/22  0611 05/02/22  1040 05/01/22  0516   Sodium 144 142 142   Potassium 4.5 4.3 4.2   Chloride 109 109 106   CO2 30* 25 28   BUN 22.0 21.0 16.0   Creatinine 0.7 0.7 0.9   Glucose 116* 133* 232*   Calcium 8.8 8.9 9.1   Magnesium 1.8 1.7  --    Phosphorus 3.2 2.8  --    eGFR >60.0 >60.0 >60.0     Recent Labs   Lab 05/01/22  0516   Albumin 3.8       Estimated Nutrition Needs:  Total Daily Energy Needs: 924 to 1078 kcal  Method for Calculating Energy Needs: 30 kcal - 35 kcal per kg  at 30.8 kg (Actual body weight)        Total Daily Protein Needs: 46.2 to 52.36 g  Method for Calculating Protein Needs: 1.5 g - 1.7 g per kg at 30.8 kg (Actual body weight)       Total Daily Fluid Needs: 770 to 770 ml  Method for Calculating Fluid Needs: 25 ml - 25 ml  per kg at 30.8 kg (Actual body weight)  Rationale: or per MD      Nutrition Diagnosis      Severe Malnutrition related to increased protein needs in the context of chronic illness as evidenced by severe muscle loss (temporalis, pectoralis, deltoid, interosseous, trapezius, supraspinatus, infraspinatus, quadriceps, gastrocnemius) and severe  fat loss (buccal, triceps).    Intervention      Please refer to top of note for nutrition interventions and recommendations     Monitoring and Evaluation      Goals: Pt to meet 75% of estimated energy needs during hospital stay.    Monitor: po  intake, labs, GOC  Follow up:  1-4 days    Haze Boyden, MS, RDN  Clinical Dietitian

## 2022-05-03 NOTE — Progress Notes (Addendum)
MEDICINE PROGRESS NOTE  [facility], DIVISION OF HOSPITALIST MEDICINE   [facility]   [place] Pager: [phone]      Date Time: 05/03/22 11:39 AM  Patient Name: [name] [name],[name] [name]  Attending Physician: Richarda Osmond, MD  Hospital Day: 3  Assessment:     41 y old male who is active smoker with severe emphysema and presented on 6/3 with SOB and was found to have O2 sat in 86% by EMS and tachycardic and tachypneic and in ED was placed on BiPAP and CXR showed right small to moderate PTX and CT chest showed moderate PTX, s/p right Chest tube placement on 6/3 and was down graded to medical floor on 6/4.      Plan:     Acute COPD exacerbation/severe emphysema  Acute hypoxemic resp failure  Right sided pneumothorax s/p chest tube placement on 6/3  -c/w Duoneb,and decadron 8 mg IV q12h for today and will change to prednisone 60 mg from am  -stop Pulmicort nebulizer and start on Breo per pulm  -c/w azithromycin,change to po in am for another 2 days (has received 3 days already)  -s/pchest tube,CXR shows resolution of PTX and no output, discussed with pulmonologist who will see pt in consultation,likely to clamp the CT and remove it today  -Is use was encouraged  -currently on 3 lits O2 via NC and O2 sat 99%,need to check O2 sat at rest and on ambulation to see if he needs O2 at home  -smoking cessation was encouraged  -c/w nicotine patch   -PT/OT      Sinus tachycardia   -due to COPD exacerbation      Leucocytosis  -WB 9.7-->17.47-->19 likely due to steroid      Hyperglycemia due to steroid  -A1C 5.5%  -c/w SSI for coverage        Cachexia/underweight/sever malnutrition  -consulted nutritionist      Palliative care consult since he has bad emphysema and there is no advance directive      PT/OT ordered       DVT UEA:[id number] sc    I spent 35 minutes on the unit in direct care for this patient  > 50% in counseling and coordination of care for conditions described in my assessment and plan.       Case was discussed  with pt and RN and pulmonologist and pt's daughter at bedside       Lines:     Patient Lines/Drains/Airways Status       Active PICC Line / CVC Line / PIV Line / Drain / Airway / Intraosseous Line / Epidural Line / ART Line / Line / Wound / Pressure Ulcer / NG/OG Tube       Name Placement date Placement time Site Days    Peripheral IV 05/01/22 18 G Right Antecubital 05/01/22  0514  Antecubital  1    Peripheral IV 05/01/22 20 G Left Antecubital 05/01/22  0630  Antecubital  1                       Subjective/ROS/24 hr events:     CC:SOB,pneumothorax     Pt seen and examined at bedside in presence of RN and pt's daughter,he speak [place] but understand English  Denies any CP,SOB is better, no fever,no abd pain,no N/V/D      Physical Exam:     Temp:  [97.4 F (36.3 C)-97.9 F (36.6 C)] 97.9 F (36.6 C)  Heart  Rate:  [97-127] 115  Resp Rate:  [16-24] 20  BP: (102-153)/(61-82) 102/62  FiO2:  [35 %] 35 %  Intake and Output Summary-last 24 Hrs:  I/O last 3 completed shifts:  In: 1580 [P.O.:1320; IV Piggyback:260]  Out: 880 [Urine:880]    O2 Flow Rate (L/min)  Avg: 2.3 L/min  Min: 2 L/min  Max: 3 L/min by O2 Device: Nasal cannula     General: AAOX3,on 3 lit O2 via NC,cachectic, in no acute distress,  Cardiovascular: tachycardia  Lungs: globally reduced air entry b/l lung but better today than yesterday,+chest tube right lateral chest  Abdomen: soft, non-tender, non-distended; normoactive bowel sounds  Extremities: no edema  Neurological: Alert and oriented X 3, moves all extremities.   Skin:no rash or lesion  Foley: none  Central line:none       Meds:   Medications were reviewed:  Scheduled Meds:  Current Facility-Administered Medications   Medication Dose Route Frequency    albuterol-ipratropium  3 mL Nebulization Q4H SCH    [START ON 05/04/2022] azithromycin  250 mg Oral Daily    budesonide  0.5 mg Nebulization BID    dexAMETHasone  8 mg Intravenous Q12H SCH    heparin (porcine)  5,000 Units Subcutaneous Q8H SCH     insulin lispro  1-3 Units Subcutaneous QHS    insulin lispro  1-5 Units Subcutaneous TID AC    nicotine  1 patch Transdermal Daily    [START ON 05/04/2022] predniSONE  40 mg Oral QAM W/BREAKFAST    senna-docusate  1 tablet Oral Q12H SCH     Continuous Infusions:  PRN Meds:.acetaminophen, morphine  Labs/Radiology:   Imaging personally reviewed, including: all available   XR Chest AP Portable    Result Date: 05/03/2022   Stable position of right-sided pleural catheter without radiographic evidence of pneumothorax. Remainder as above. Ivin Poot, MD 05/03/2022 4:37 AM     Recent Labs     05/03/22  0601 05/02/22  1957 05/02/22  1545 05/02/22  1152   Whole Blood Glucose POCT 117* 190* 228* 111*     Recent Labs   Lab 05/03/22  0611 05/02/22  1040 05/01/22  0516   Sodium 144 142 142   Potassium 4.5 4.3 4.2   Chloride 109 109 106   BUN 22.0 21.0 16.0   Creatinine 0.7 0.7 0.9   eGFR >60.0 >60.0 >60.0   Glucose 116* 133* 232*   Calcium 8.8 8.9 9.1     Recent Labs   Lab 05/03/22  0611 05/02/22  1040 05/01/22  0516   WBC 19.44* 17.47* 9.70*   Hgb 10.9* 11.2* 13.5   Hematocrit 35.5* 36.6* 44.5   Platelets 259 244 330         Recent Labs   Lab 05/01/22  0516   Alkaline Phosphatase 86   Bilirubin, Total 0.3   ALT 17   AST (SGOT) 30     Recent Labs   Lab 05/01/22  0516   Procalcitonin 0.03         Signed by: Richarda Osmond, MD, FACP

## 2022-05-03 NOTE — Malnutrition Assessment (Signed)
Lindon Kiel is a 71 y.o. male patient.   [id number]    Malnutrition Assessment   Malnutrition Documentation    Severe Malnutrition related to increased protein needs in the context of chronic illness as evidenced by severe muscle loss (temporalis, pectoralis, deltoid, interosseous, trapezius, supraspinatus, infraspinatus, quadriceps, gastrocnemius) and severe fat loss (buccal, triceps).       Haze Boyden, MS, RDN  Clinical Dietitian    If physician disagrees with this assessment see addendum.

## 2022-05-04 DIAGNOSIS — N401 Enlarged prostate with lower urinary tract symptoms: Secondary | ICD-10-CM

## 2022-05-04 DIAGNOSIS — R5381 Other malaise: Secondary | ICD-10-CM

## 2022-05-04 DIAGNOSIS — Z515 Encounter for palliative care: Secondary | ICD-10-CM

## 2022-05-04 DIAGNOSIS — J9620 Acute and chronic respiratory failure, unspecified whether with hypoxia or hypercapnia: Secondary | ICD-10-CM

## 2022-05-04 DIAGNOSIS — Z7189 Other specified counseling: Secondary | ICD-10-CM

## 2022-05-04 DIAGNOSIS — Z66 Do not resuscitate: Secondary | ICD-10-CM

## 2022-05-04 LAB — CBC WITH MANUAL DIFFERENTIAL
Absolute NRBC: 0 10*3/uL (ref 0.00–0.00)
Band Neutrophils Absolute: 0 10*3/uL (ref 0.00–1.00)
Band Neutrophils: 0 %
Basophils Absolute Manual: 0 10*3/uL (ref 0.00–0.08)
Basophils Manual: 0 %
Cell Morphology: NORMAL
Eosinophils Absolute Manual: 0 10*3/uL (ref 0.00–0.44)
Eosinophils Manual: 0 %
Hematocrit: 37.1 % — ABNORMAL LOW (ref 37.6–49.6)
Hgb: 11.3 g/dL — ABNORMAL LOW (ref 12.5–17.1)
Lymphocytes Absolute Manual: 0.54 10*3/uL (ref 0.42–3.22)
Lymphocytes Manual: 3 %
MCH: 28 pg (ref 25.1–33.5)
MCHC: 30.5 g/dL — ABNORMAL LOW (ref 31.5–35.8)
MCV: 91.8 fL (ref 78.0–96.0)
MPV: 9.5 fL (ref 8.9–12.5)
Monocytes Absolute: 0.54 10*3/uL (ref 0.21–0.85)
Monocytes Manual: 3 %
Neutrophils Absolute Manual: 16.81 10*3/uL — ABNORMAL HIGH (ref 1.10–6.33)
Nucleated RBC: 0 /100 WBC (ref 0.0–0.0)
Platelet Estimate: NORMAL
Platelets: 260 10*3/uL (ref 142–346)
RBC: 4.04 10*6/uL — ABNORMAL LOW (ref 4.20–5.90)
RDW: 13 % (ref 11–15)
Segmented Neutrophils: 94 %
WBC: 17.88 10*3/uL — ABNORMAL HIGH (ref 3.10–9.50)

## 2022-05-04 LAB — BASIC METABOLIC PANEL
Anion Gap: 6 (ref 5.0–15.0)
BUN: 29 mg/dL — ABNORMAL HIGH (ref 9.0–28.0)
CO2: 31 mEq/L — ABNORMAL HIGH (ref 17–29)
Calcium: 8.9 mg/dL (ref 7.9–10.2)
Chloride: 105 mEq/L (ref 99–111)
Creatinine: 0.7 mg/dL (ref 0.5–1.5)
Glucose: 122 mg/dL — ABNORMAL HIGH (ref 70–100)
Potassium: 4.9 mEq/L (ref 3.5–5.3)
Sodium: 142 mEq/L (ref 135–145)
eGFR: >60 mL/min/{1.73_m2} (ref >=60)

## 2022-05-04 LAB — GLUCOSE WHOLE BLOOD - POCT
Whole Blood Glucose POCT: 117 mg/dL — ABNORMAL HIGH (ref 70–100)
Whole Blood Glucose POCT: 117 mg/dL — ABNORMAL HIGH (ref 70–100)
Whole Blood Glucose POCT: 140 mg/dL — ABNORMAL HIGH (ref 70–100)
Whole Blood Glucose POCT: 124 mg/dL — ABNORMAL HIGH (ref 70–100)

## 2022-05-04 NOTE — OT Progress Note (Signed)
Occupational Therapy Screen Note     Patient: [name] [name]  ZOX:[id number]    Unit: MI627/MI627-01    OT eval orders received. Patient not seen for occupational therapy evaluation as pt is at/near functional baseline of indep with all ADL, transfers, mobility per discussion with treating PT. Discussed role of OT with pt and son, reports mod I/indep with ADL tasks and has no concerns regarding performance. D/C recommendation home. No acute OT needs identified, d/c OT. Will need new orders if functional status changes.     Therapist PPE during session procedural mask and gloves      Signature:   Jearld Lesch Dinah Lupa, OT  05/04/2022  12:05 PM  Phone: [phone]    (For scheduling questions, please contact rehab tech [phone])

## 2022-05-04 NOTE — Progress Notes (Signed)
MEDICINE PROGRESS NOTE  [facility], DIVISION OF HOSPITALIST MEDICINE   [facility]   [place] Pager: [phone]      Date Time: 05/04/22 6:09 PM  Patient Name: [name],[name]  Attending Physician: Laretta Bolster, MD  Hospital Day: 4  Assessment:     88 y old male who is active smoker with severe emphysema and presented on 6/3 with SOB and was found to have O2 sat in 86% by EMS and tachycardic and tachypneic and in ED was placed on BiPAP and CXR showed right small to moderate PTX and CT chest showed moderate PTX, s/p right Chest tube placement on 6/3 and was down graded to medical floor on 6/4.    Plan:     Acute COPD exacerbation/severe emphysema  Acute hypoxemic resp failure  Right sided pneumothorax s/p chest tube placement on 6/3  -c/w Duoneb,and decadron 8 mg IV q12h for today and will change to prednisone 60 mg from am  -stop Pulmicort nebulizer and start on Breo per pulm  -c/w azithromycin,change to po in am for another 2 days (has received 3 days already)  -s/pchest tube,CXR shows resolution of PTX and no output, discussed with pulmonologist by previous attending  -Is use was encouraged  -currently on 3 lits O2 via NC and O2 sat 99%,need to check O2 sat at rest and on ambulation to see if he needs O2 at home  -smoking cessation was encouraged  -Reached out to pulmonologist today regarding removal of chest tube, cleared to remove-will reach out to IR regarding removal of chest tube.  -c/w nicotine patch   -PT/OT      Sinus tachycardia   -Significantly tachycardic today in 120s  -- Deconditioning also playing a big role      Leucocytosis  -WBC 9.7-->17.47-->19, down to 17.8 likely due to steroids      Hyperglycemia due to steroid  -A1C 5.5%  -c/w SSI for coverage        Cachexia/underweight/sever malnutrition  -consulted nutritionist      Palliative care consult since he has bad emphysema and there is no advance directive      PT/OT ordered       DVT VOH:[id number] sc    I spent 35 minutes on the  unit in direct care for this patient  > 50% in counseling and coordination of care for conditions described in my assessment and plan.       Case was discussed with pt and RN  Discussed patient's son at bedside.      Lines:     Patient Lines/Drains/Airways Status       Active PICC Line / CVC Line / PIV Line / Drain / Airway / Intraosseous Line / Epidural Line / ART Line / Line / Wound / Pressure Ulcer / NG/OG Tube       Name Placement date Placement time Site Days    Peripheral IV 05/01/22 18 G Right Antecubital 05/01/22  0514  Antecubital  1    Peripheral IV 05/01/22 20 G Left Antecubital 05/01/22  0630  Antecubital  1                       Subjective/ROS/24 hr events:     CC:SOB,pneumothorax     I seen and examined the patient.  Patient's son is at bedside.  Patient is able to understand English however patient's son is available to interpret as per preference of the patient.  Patient's heart  rate noted to be elevated.  Otherwise patient is comfortable sitting out of bed in chair.  Chest tube in place.  No chest pain.  Mild shortness of breath.      Physical Exam:     Temp:  [97.2 F (36.2 C)-97.9 F (36.6 C)] 97.2 F (36.2 C)  Heart Rate:  [101-120] 120  Resp Rate:  [14-20] 20  BP: (114-146)/(68-86) 130/82  Intake and Output Summary-last 24 Hrs:  I/O last 3 completed shifts:  In: 100 [P.O.:100]  Out: 2100 [Urine:2100]    O2 Flow Rate (L/min)  Avg: 1.4 L/min  Min: 1 L/min  Max: 2 L/min by O2 Device: None (Room air)     General: AAOX3,on 3 lit O2 via NC,cachectic, in no acute distress,  Cardiovascular: tachycardia  Lungs: globally reduced air entry b/l lung but better today than yesterday,+chest tube right lateral chest  Abdomen: soft, non-tender, non-distended; normoactive bowel sounds  Extremities: no edema  Neurological: Alert and oriented X 3, moves all extremities.   Skin:no rash or lesion  Foley: none  Central line:none       Meds:   Medications were reviewed:  Scheduled Meds:  Current  Facility-Administered Medications   Medication Dose Route Frequency    albuterol-ipratropium  3 mL Nebulization Q4H SCH    azithromycin  250 mg Oral Daily    fluticasone furoate-vilanterol  1 puff Inhalation QAM    heparin (porcine)  5,000 Units Subcutaneous Q8H SCH    insulin lispro  1-3 Units Subcutaneous QHS    insulin lispro  1-5 Units Subcutaneous TID AC    nicotine  1 patch Transdermal Daily    predniSONE  60 mg Oral QAM W/BREAKFAST    senna-docusate  1 tablet Oral Q12H SCH     Continuous Infusions:  PRN Meds:.acetaminophen  Labs/Radiology:   Imaging personally reviewed, including: all available   No results found.    Recent Labs     05/04/22  1627 05/04/22  1159 05/04/22  0626 05/03/22  2057   Whole Blood Glucose POCT 140* 117* 117* 125*       Recent Labs   Lab 05/04/22  0608 05/03/22  0611 05/02/22  1040 05/01/22  0516   Sodium 142 144 142 142   Potassium 4.9 4.5 4.3 4.2   Chloride 105 109 109 106   BUN 29.0* 22.0 21.0 16.0   Creatinine 0.7 0.7 0.7 0.9   eGFR >60.0 >60.0 >60.0 >60.0   Glucose 122* 116* 133* 232*   Calcium 8.9 8.8 8.9 9.1       Recent Labs   Lab 05/04/22  0608 05/03/22  0611 05/02/22  1040 05/01/22  0516   WBC 17.88* 19.44* 17.47* 9.70*   Hgb 11.3* 10.9* 11.2* 13.5   Hematocrit 37.1* 35.5* 36.6* 44.5   Platelets 260 259 244 330           Recent Labs   Lab 05/01/22  0516   Alkaline Phosphatase 86   Bilirubin, Total 0.3   ALT 17   AST (SGOT) 30       Recent Labs   Lab 05/01/22  0516   Procalcitonin 0.03           Signed by: Laretta Bolster, MD

## 2022-05-04 NOTE — Progress Notes (Addendum)
Pulmonology note from 05/03/22 states chest tube (CT) can be removed. Pt family reported to RN yesterday that pulmonologist would return a second time to bedside after latest CXR results released. Today, attending asked RN to reach out to pulmonologist as to whether he would come to remove CT. No response via Youth worker or Pulmonology group SecureChat, no pageable number available, and posted number goes to voicemail only. Updated physician, who responded will need to get IR to remove CT. No order presently; contacted MD will pass on to night RN for tomorrow's day RN to remind attending about IR order.    1809: Order for IR consult placed.

## 2022-05-04 NOTE — Consults (Signed)
[facility] Palliative Medicine & Comprehensive Care Follow Up   Service Spectralink: [phone]  Mon-Fri 9a-4p  Xtend Pager: #[phone]    24/7        Palliative Care Consult   Date Time: 05/04/22 2:50 PM   Patient Name: [name] [name],[name] [name]   Location: MI627/MI627-01   Attending Physician: Laretta Bolster, MD   Primary Care Physician: Marisa Sprinkles, MD   Consulting Provider: Lisbeth Renshaw, FNP   Consulting Service: Palliative Medicine and Comprehensive Care  Consult request from Laretta Bolster, MD to see patient regarding:   Reason for Referral: Advance care planning; Clarify goals of care     Palliative Diagnosis Category: Sepsis/infectious disease, Cardiac, and Pulmonary  Patient Type: New    Planssment & Plan  Assessment & Plan   Impression      Ramond Darnell is a 71 y.o. male with significant PMH of active smoker, and severe emphysema. Pt was admitted to [facility] on 05/01/2022 for new onset dyspnea  due to acute COPD and right sided pneumothorax s/p chest tube placed 6/3.  Palliative care services consulted : advance directive and clarify goals of care.      Estimated Prognosis: Unclear  Thank you for allowing [place] to participate in the care of this patient.    Recommendations   1. Goals of Care:      # What matters most to the patient:  Completed medical advance directive and DNR.  Copy in EMR      # Code Status:       - NO CPR - SUPPORT OK     -Patient does not want CPR or intubated and completed DNR     -Discussed Code status in detail with the patient and son  . Introduced cardiopulmonary    resuscitation as an emergency lifesaving procedure performed when the heart stops beating. I explained in depth about possible outcomes during and after CPR with decreased chance of survival as time slip.       -I explained that  the choice about CPR does not affect the care he  will receive or decisions about other treatments.      # Advance Care Planning:       - ACP Validation: Valid advanced care planning documents present on  admission and Valid durable DNR/POST documents present on admission       - Medical Decision Maker: medical POA- first son [place], second  spouse Charlesetta Ivory       - ACP Document: Living will, HCPOA, and DDNR/POST      # Palliative Care:       - Introduced the philosophy of palliative care to the patient and family as care that is aimed  in assisting with symptom management and establishing goals of care with the patient and family. Discussed guidance with medical decision making, diagnosis, past medical history and  current  treatment plans.Provide an  overview of the interdisciplinary team for Palliative Medicine that consist of clinical therapist, nurse practitioner, and chaplain.   Introduced palliative care to the patient and son at bedside.  We discussed medical history and reason for admission   Discussed medical advance directive and code status  Patient completed medical advance directive and completed medical POA.  The son reported that patient never really follow up on his care , and will like a list of [facility] primary care providers so that they can follow up when he is discharged from the hospital.  List provided to the son .  He reported now that the pt lives with him, he will ensure he keeps up his appointments   The patient also promised to quit smoking this time     2. Symptom Management:        # At risk for Delirium:    - Delirium Precautions:           * Frequent reorientation by staff and maintaining appropriate day-night cycles          * Maximize visibility of environmental cues such as clocks, calendars          * Encourage communication and visits with family members and friends          * Allow exposure to natural light during the day, opening curtains to window          * Encourage day time activities, OOB and keeping pt active during day if possible           * Minimize disturbances and reduce light exposure and TV in the evening/night           * Limit non essential care such as blood  draws, imaging, etc          * Group blood draws together to limit frequency of needle sticks           * Optimize nutrition, bladder/bowel regimen. Constipation, urinary retention, dehydration may worsen delirium. Rule out infection such as UTI or PNA which can predispose patients to delirium.          * Minimize medications that may cause/exacerbate delirium: opiates, anticholinergics, antihistamines, benzodiazepines, and other sedatives      # Debility:     # Poor Performance Status:       -Generalized weakness       -Appreciate PT/OT's input    3. Psychosocial: Great family support    4. Spiritual:        # No religion on file     5. Outcomes: Family meetings, Clarified goals of care, Provided advance care planning, Provided psychosocial or spiritual support, Changed CPR status, Completed durable DNR, Linked to palliative longitudinal support, ACP counseling assistance, POST counseling, and POST completion      PC Team follow-up plans: tomorrow      Discharge Disposition: Palliative care sign off (patient is still admitted)      Outpatient Follow Up Recommended: No   Discussed case with: bedside RN, patient's son and attending     80 minutes.  Total time today in care of patient including chart review, evaluation, management, counseling & coordination of care with recommendations above >50% of time on floor.         Lisbeth Renshaw, APRN, FNP-C  Palliative Medicine & Comprehensive Care  Phone Number: MV: [phone], Mon-Fri 9a-4p     History of Presenting Illness   Medical Records Reviewed: [place] Records via Epic (current and past), Outside Records via [employer], Outside Records via paper records, and [employer]  Program ([place] PMP)    Yaniv Lage is a 71 y.o. male with significant PMH of active smoker, and severe emphysema. Pt was admitted to [facility] on 05/01/2022 for new onset dyspnea  due to acute COPD and right sided pneumothorax s/p chest tube placed  6/3.  Palliative care services consulted : advance directive and clarify goals of care    I Introduced the philosophy of palliative care as a patient and family -centered care that  optimizes quality  of life throughout the continuum of illness. Palliative care addresses physical, intellectual, emotional , social , and spiritual needs of the patient      Goals of Care   CURRENT CPR Status: NO CPR - SUPPORT OK       Advanced Care Planning:      Decisional Capacity: yes      Advance Directives have been completed in the past: yes      Advance Directives are available in chart: yes      Discussed this admission: yes      ACP note completed: yes       Date:05/04/22      Advance Care Planning Discussion:   Provided counseling on medical advance directive    I have spent 18 minutes on face-to-face Advance Care Planning Services.    100% of the time was spent on discussion and counseling the patient and son.    No active management of the problems listed above was undertaken during the time reported.  Provided counseling/education regarding [place] Physician Orders for Scope of Treatment (POST) form:     - POST forms tell your health care team what you want. They are used when you cannot communicate and you need medical care.      - POST forms are medical orders and this means that they travel with you. Wherever you are, your POST form tells health care team what treatments you want and your goals of care, even if you transfer from hospital to nursing home, back to your home, or to hospice or another settings.      - POST forms are must be filled out and signed by a healthcare provider (MD/NP/PA).         Palliative Functional and Symptom Assessment      ADLs prior to admission:    Independent Needs assistance Dependent   Ambulation [x]  []  []    Transferring [x]  []  []    Dressing [x]  []  []    Bathing [x]  []  []    Toileting [x]  []  []    Feeding [x]  []  []      Palliative Performance Scale: 70% - Reduced ambulation, unable to do  normal work, some evidence of disease, full self-care, normal or reduced intake, full LOC    FAST Score (Dementia Patients): N/A    ECOG (Cancer Patients): N/A    [place] Symptom Assessment Scale (ESAS): Completed       Review of Systems   [x]  As per HPI and physical exam, otherwise all systems negative    Pain: not applicable  Pain Location and Description: declined pain   General: Fatigue, decreased energy, no F/C/S, no Headache, no insomnia  Eyes: no visual changes, eye pain, eye redness  Mouth:  no oral sores, lesions or thrush   Pulm: right chest tube  Cards: no chest pain or palpitations, no edema  GI: anorexia, early satiety, constipation or diarrhea / last BM 6/2,  no abdominal pain or bloating, no weight loss  GU: no hematuria, dysuria, or incontinence, no urinary retention   Skin: no rash, lesions no skin breakdown, no puritis  MS: no new muscle weakness, joint stiffness, aches, swelling  Neuro: no focal weakness, numbness, tingling, no dizziness/vertigo   Psyche: Mood is calm no history of Anxiety or depression  Heme: no easy bleeding, bruising, petechiae       Past Medical, Surgical and Family History   Past Medical History:  Past Medical History:   Diagnosis Date    Collapsed lung  Emphysema lung     Enlarged prostate         Past Surgical History:  Past Surgical History:   Procedure Laterality Date    CYSTOSCOPY, LITHOLAPAXY N/A 01/04/2020    Procedure: Gabriel Rainwater;  Surgeon: Miquel Dunn, MD;  Location: [facility];  Service: Urology;  Laterality: N/A;  CYSTOLITHOLAPAXY    PROSTATE SURGERY          Past Family History:  No family history on file.   I have reviewed the medical records, current labs, imaging studies, progress notes and consults.  The following aspects of pertinent.   Social History   Substance Use:    reports that he has been smoking cigarettes. He has never used smokeless tobacco.    reports no history of alcohol use.    reports no history of drug use.     Marital  Status: married    Home/Family: spouse and children     Occupation/Hobbies: retired    Insurance risk surveyor Concerns:    Nurse, learning disability needed: []  NO   [x]  YES                                       Language: [place]  -patient prefers his son Mr [name] translate on his behalf    Spirituality and Importance: No religion on file      Medications   Scheduled Meds  Current Facility-Administered Medications   Medication Dose Route Frequency    albuterol-ipratropium  3 mL Nebulization Q4H SCH    azithromycin  250 mg Oral Daily    fluticasone furoate-vilanterol  1 puff Inhalation QAM    heparin (porcine)  5,000 Units Subcutaneous Q8H SCH    insulin lispro  1-3 Units Subcutaneous QHS    insulin lispro  1-5 Units Subcutaneous TID AC    nicotine  1 patch Transdermal Daily    predniSONE  60 mg Oral QAM W/BREAKFAST    senna-docusate  1 tablet Oral Q12H SCH      Drips     PRN Meds  Current Facility-Administered Medications   Medication Dose    acetaminophen  650 mg       Allergies   No Known Allergies    Physical Exam   BP 122/79   Pulse (!) 109   Temp 97.2 F (36.2 C) (Axillary)   Resp 14   Ht 1.575 m (5\' 2" )   Wt (!) 30.8 kg (67 lb 14.4 oz) Comment: PER RECORD  SpO2 97%   BMI 12.42 kg/m    Physical Exam:  General: Chronically ill-appearing , malnourished , no acute distress   HEAD:  Normal with no signs of head trauma.  EYES:  PERRLA, EOMI, conjunctiva normal, no discharge.    EARS:  Hearing grossly intact.  NOSE: Normal.  THROAT:  Oropharynx is normal.   NECK:  Normal range of motion, no tenderness, supple, no lymphadenopathy, No adenopathy, no JVD.     CHEST:  reduced air entry, + chest tube .  CARDIAC:  Regular rate and rhythm.  S1 and S2, without murmurs, gallops, or rubs.  VASCULAR:  No Edema.  Peripheral pulses normal and equal in all extremities.  ABDOMEN: Normal and soft with no tenderness, no masses or pulsatile masses.  GASTROINTESTINAL: Bowel sounds normal  GENITOURINARY: Normal, No tenderness  LYMPATHTIC:  No  lymphadenopathy noted.  MUSCULOSKELETAL:  Good range of motion of all major  joints. Extremities without clubbing, cyanosis or edema.    NEUROLOGICAL:  Alert and oriented x 3.  No focal sensory or strength deficits.   Speech normal.  Follows commands appropriately.  PSYCHIATRIC:  Normal Affect, judgement and mood.  SKIN:  Normal appearance with no rashes or lesions.      Labs / Radiology   Lab and diagnostics: reviewed in Epic  Recent Labs   Lab 05/04/22  0608   WBC 17.88*   Hgb 11.3*   Hematocrit 37.1*   Platelets 260              Recent Labs   Lab 05/04/22  0608   Sodium 142   Potassium 4.9   Chloride 105   CO2 31*   BUN 29.0*   Creatinine 0.7   eGFR >60.0   Glucose 122*   Calcium 8.9     Recent Labs   Lab 05/01/22  0516   Bilirubin, Total 0.3   Protein, Total 7.4   Albumin 3.8   ALT 17   AST (SGOT) 30          XR Chest AP Portable    Result Date: 05/03/2022   Stable position of right-sided pleural catheter without radiographic evidence of pneumothorax. Remainder as above. Ivin Poot, MD 05/03/2022 4:37 AM    XR Chest AP Portable    Result Date: 05/02/2022   Slightly improved right-sided pneumothorax. The exam is otherwise stable. Lorenda Peck, MD 05/02/2022 11:41 AM    XR Chest AP Portable    Result Date: 05/02/2022  Stable right-sided chest tube. No evidence of residual pneumothorax. Aldean Ast, MD 05/02/2022 6:29 AM    XR Chest AP Portable    Result Date: 05/01/2022   1. Status post chest tube placement as described. There is a suspected residual right apical pneumothorax noted. Lorenda Peck, MD 05/01/2022 9:41 AM    CT Chest without Contrast    Result Date: 05/01/2022   Extensive emphysematous changes and parenchymal scarring. There is a moderate size right-sided pneumothorax. The lungs however remain relatively well expanded. Continued follow-up is recommended. Note: A combination of automatic exposure control, adjustment of the MA and/or KV according to patient size and/or use of iterative reconstructive technique was  utilized. Lorenda Peck, MD 05/01/2022 7:27 AM    XR Chest  AP Portable    Result Date: 05/01/2022  Small right pneumothorax. Emphysema. Aldean Ast, MD 05/01/2022 5:54 AM

## 2022-05-04 NOTE — Plan of Care (Addendum)
Pt alert and oriented x 4, chest tube connected to low suction -20, 5mL serosanguinous drainage noted from previous shift, no drainage recorded on current shift. Pt on Oxygen 2L/min NC, HR in the 100s, pt denies any pain and SOB. Both Chest tube insertion site and dressing are clean dry and intact. No bubbling or air leak observed. Will continue to monitor throughout shift.    Problem: Compromised Tissue integrity  Goal: Damaged tissue is healing and protected  Outcome: Progressing  Flowsheets (Taken 05/04/2022 0228)  Damaged tissue is healing and protected:   Monitor/assess Braden scale every shift   Increase activity as tolerated/progressive mobility   Relieve pressure to bony prominences for patients at moderate and high risk     Problem: Inadequate Gas Exchange  Goal: Adequate oxygenation and improved ventilation  05/04/2022 0228 by Zetang Epse Jua, Joline Encalada, RN  Outcome: Progressing  Flowsheets (Taken 05/04/2022 0228)  Adequate oxygenation and improved ventilation:   Assess lung sounds   Monitor SpO2 and treat as needed   Monitor and treat ETCO2   Position for maximum ventilatory efficiency   Provide mechanical and oxygen support to facilitate gas exchange   Teach/reinforce use of incentive spirometer 10 times per hour while awake, cough and deep breath as needed  05/04/2022 0226 by Zetang Epse Jua, Carley Strickling, RN  Outcome: Progressing  Flowsheets (Taken 05/04/2022 0226)  Adequate oxygenation and improved ventilation:   Assess lung sounds   Teach/reinforce use of incentive spirometer 10 times per hour while awake, cough and deep breath as needed   Position for maximum ventilatory efficiency   Monitor SpO2 and treat as needed   Monitor and treat ETCO2  Goal: Patent Airway maintained  05/04/2022 0228 by Dondra Spry, Donne Robillard, RN  Outcome: Progressing  Flowsheets (Taken 05/04/2022 0228)  Patent airway maintained:   Position patient for maximum ventilatory efficiency   Provide adequate fluid intake to liquefy  secretions  05/04/2022 0226 by Zetang Epse Jua, Husam Hohn, RN  Outcome: Progressing  Flowsheets (Taken 05/04/2022 0226)  Patent airway maintained:   Position patient for maximum ventilatory efficiency   Provide adequate fluid intake to liquefy secretions   Suction secretions as needed   Reinforce use of ordered respiratory interventions (i.e. CPAP, BiPAP, Incentive Spirometer, Acapella, etc.)     Problem: Nutrition  Goal: Nutritional intake is adequate  Outcome: Progressing  Flowsheets (Taken 05/04/2022 0228)  Nutritional intake is adequate:   Assist patient with meals/food selection   Allow adequate time for meals   Encourage/perform oral hygiene as appropriate   Monitor daily weights   Encourage/administer dietary supplements as ordered (i.e. tube feed, TPN, oral, OGT/NGT, supplements)     Problem: Moderate/High Fall Risk Score >5  Goal: Patient will remain free of falls  Outcome: Progressing     Problem: Safety  Goal: Patient will be free from injury during hospitalization  Outcome: Progressing  Flowsheets (Taken 05/04/2022 0228)  Patient will be free from injury during hospitalization:   Assess patient's risk for falls and implement fall prevention plan of care per policy   Provide and maintain safe environment   Ensure appropriate safety devices are available at the bedside   Hourly rounding  Goal: Patient will be free from infection during hospitalization  Outcome: Progressing  Flowsheets (Taken 05/04/2022 0228)  Free from Infection during hospitalization:   Assess and monitor for signs and symptoms of infection   Monitor lab/diagnostic results   Monitor all insertion sites (i.e. indwelling lines, tubes, urinary catheters, and drains)  Encourage patient and family to use good hand hygiene technique

## 2022-05-04 NOTE — PT Eval Note (Signed)
Physical Therapy Evaluation  [name]      Post Acute Care Therapy Recommendations:     Discharge Recommendations:  Home with supervision, Home with home health PT        DME needs IF patient is discharging home: Front wheel walker, Grab bars, Shower chair    Therapy discharge recommendations may change with patient status.  Please refer to most recent note for up-to-date recommendations.           [facility]  [address]  [place], [place] [address]  [phone]    Physical Therapy Evaluation    Patient: [name] [name] MRN: [id number]   Unit: [facility] INTERMEDIATE CARE Bed: MI627/MI627-01    Time of Treatment: Time Calculation  PT Received On: 05/04/22  Start Time: 6213  Stop Time: 1025  Time Calculation (min): 30 min    Consult received for [facility] for PT evaluation and treatment.  Patient's medical condition is appropriate for Physical Therapy  intervention at this time.    Interpreter utilized: no, not indicated      Assessment   19 y old male who is active smoker with severe emphysema and presented on 6/3 with SOB and was found to have O2 sat in 86% by EMS and tachycardic and tachypneic and in ED was placed on BiPAP and CXR showed right small to moderate PTX and CT chest showed moderate PTX, s/p right Chest tube placement on 6/3 and was down graded to medical floor on 6/4.  Pt was received semi-supine in bed, his son at [facility]. Pt reports doing well, denied any pain and agreed to PT services. PT educated pt and son regarding PT role in acute care. Pt at baseline is (I) w/ mobility w/o device, (I) w/ ADL's and drives. Pt stays on main floor except when he has to take shower he has to negotiate one FOS to get to shower. He has 20 steps to get to the main level . Pt had ambulated once yesterday with nursing but his HR increased to 130 BPM and he desaturated. Pt performed all bed mobility with SUP A x1, pt is impulsive ans needs to be told to take his time and not to rush. Sat up at side of his bed (I), w/ good  balance, performed STS x3 w/ SBA x1. Pt's functional mobility is impacted by generalized weakness, DOE, low activity tolerance, he has chest tube in place, On O2. Pt will benefit from further PT skilled services during his hospitalization to improve these deficits. Pt can be d/c'd home with supervision of family  and home PT services for home evaluation, he will benefit from having a RW to assist w/ mobility.    Therapeutic Intervention ( 10 Minutes):      Transferred STS w/ SBA x1, pt ambulated 150 ft w/ RW w/ CGA x1 to SBA x1 ( assisting w/ chest tube) , 2nd person assisting with pulse OX and O2 tank. Pt's O2 sat stayed w/in 94-95% , HR never increased above 120 bpm. Pt was left to sit up in the chair w/ son at his side, all needs at reach.       Complexity Level Hx and Co  morbidites Examination Clinical Decision Making Clinical Presentation   Low  no impact 1-2 elements Limited options Stable       PMP - Progressive Mobility Protocol   PMP Activity: Step 7 - Walks out of Room  Distance Walked (ft) (Step 6,7): 100 Feet  Rehabilitation Potential: good       Interdisciplinary Communication: OT, RN      Plan   PT  Plan  Risks/Benefits/POC Discussed with Pt/Family: With patient/family  Treatment/Interventions: Gait training;Stair training;Functional transfer training;Neuromuscular re-education;LE strengthening/ROM;Endurance training  PT Frequency: 3-4x/wk         Medical Diagnosis: 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 male comes in from home for evaluation of shortness of breath.  Son states earlier tonight the patient had an episode of shortness of breath which resolved on its own.  He then had a second episode which appeared more severe associated with diaphoresis so he called the ambulance.  Patient room air sat 85% he was given a  DuoNeb prior to arrival with his saturation improved to 95% and greater.  Patient denies chest pain.  No cough or recent illness.  He has a known history of emphysema and has a known collapsed L lung at baseline.       Patient Active Problem List   Diagnosis    Bladder stone    Urinary retention    Benign prostatic hyperplasia with lower urinary tract symptoms    Right kidney stone    Acute on chronic respiratory failure, unspecified whether with hypoxia or hypercapnia       Past Medical History:   Diagnosis Date    Collapsed lung     Emphysema lung     Enlarged prostate        Past Surgical History:   Procedure Laterality Date    CYSTOSCOPY, LITHOLAPAXY N/A 01/04/2020    Procedure: Gabriel Rainwater;  Surgeon: Miquel Dunn, MD;  [facility];  Service: Urology;  Laterality: N/A;  CYSTOLITHOLAPAXY    PROSTATE SURGERY         Tests/Labs:  Lab Results   Component Value Date/Time    HGB 11.3 (L) 05/04/2022 06:08 AM    HCT 37.1 (L) 05/04/2022 06:08 AM    K 4.9 05/04/2022 06:08 AM    NA 142 05/04/2022 06:08 AM    TROPI <2.7 05/01/2022 05:20 AM         Imaging   XR Chest AP Portable    Result Date: 05/03/2022   Stable position of right-sided pleural catheter without radiographic evidence of pneumothorax. Remainder as above. Ivin Poot, MD 05/03/2022 4:37 AM    XR Chest AP Portable    Result Date: 05/02/2022   Slightly improved right-sided pneumothorax. The exam is otherwise stable. Lorenda Peck, MD 05/02/2022 11:41 AM    XR Chest AP Portable    Result Date: 05/02/2022  Stable right-sided chest tube. No evidence of residual pneumothorax. Aldean Ast, MD 05/02/2022 6:29 AM    XR Chest AP Portable    Result Date: 05/01/2022   1. Status post chest tube placement as described. There is a suspected residual right apical pneumothorax noted. Lorenda Peck, MD 05/01/2022 9:41 AM    CT Chest without Contrast    Result Date: 05/01/2022   Extensive emphysematous changes and parenchymal scarring. There is a moderate size right-sided  pneumothorax. The lungs however remain relatively well expanded. Continued follow-up is recommended. Note: A combination of automatic exposure control, adjustment of the MA and/or KV according to patient size and/or use of iterative reconstructive technique was utilized. Lorenda Peck, MD 05/01/2022 7:27 AM    XR Chest  AP Portable    Result Date: 05/01/2022  Small right pneumothorax. Emphysema. Aldean Ast, MD 05/01/2022 5:54 AM     Social History: (Per family/care provider, if patient is not able to give accurate report)  Lives with son  in a townhouse.  Entry Steps: 20   Rails: N Inside steps: 1 FOS  Rails: Y R side  Equipment at home:  walk in shower  Prior Level of Function:  (I)   Cognition: WFL    Mobility/Locomotion: (I)   Feeding: (I)   Grooming: (I)   Bathing: (I)   Dressing: (I)   Toileting: (I)    Subjective   " I am good"  Patient is agreeable to participation in the therapy session. Nursing clears patient for therapy.  Patient's Goal:  go home  Pain: pt denies pain    Objective     Precautions/ Contraindications:   Precautions  Weight Bearing Status: no restrictions  Other Precautions: chest tube in place, Drop in O2, tachy cardia, fall    Patient is in bed with telemetry, O2 at 1 liters/minute via NC , and chest tube  in place.       Observation of patient/vitals:        Vitals:    05/04/22 0800 05/04/22 1016 05/04/22 1020 05/04/22 1204   BP: 114/73   122/79   Pulse: (!) 101   (!) 109   Resp: 16   14   Temp: 97.2 F (36.2 C)   97.2 F (36.2 C)   TempSrc: Axillary   Axillary   SpO2: 100% 97% 94% 97%   Weight:       Height:           Orientation/Cognition:  Alert and Oriented x 3  Cognition: follows commands      Musculoskeletal Examination:     RLE [facility] WFL   LLE [facility] WFL     Sensation: intact to light touch  Coordination: WFL    Functional Mobility:     Supine to sit: SUPA x1  Scooting: SUPA x1  Sit to Supine: SUPA x1  Sit to stand:SBA x1  Stand to sit: SBA x1    Ambulation:     Weightbearing: FWB   Assistance  level: CGAx1   Distance: 150   Assistive Device: RW   Gait Deviations: slight drifting w/o device      Balance:  Static Sit: good  Dynamic Sit: good  Static Stand: good  Dynamic Stand: good-    Endurance: fair    Participation:  good    Education:  Educated the patient to role of physical therapy, plan of care, goals  of therapy, rationale for progressing mobility and safety with mobility and ADLs.    RN notified of session outcome and that patient was left in chair with all needs met and equipment intact.   Safety measures include: handoff to nurse/clin tech/ unit secretary completed, personal items within reach, assistive device positioned out of reach, and family/caregiver at bedside.   Mobility and ADL status posted at bedside and within E.M.R.               Goals  Goal Formulation: With patient/family  Time for Goal Acheivement: 5 visits  Goals: Select goal  Pt Will Go Supine To Sit: modified independent  Pt Will Perform Sit To Supine: modified independent  Pt Will Perform Sit to Stand: modified independent  Pt Will Transfer Bed/Chair: modified independent  Pt Will Ambulate: > 200 feet;with rolling  walker;modified independent  Pt Will Go Up / Down Stairs: 1 flight;with supervision           Therapist PPE during session procedural mask and gloves     Signature:  Delsa Grana, PT  05/04/2022  3:42 PM  Phone: [phone]     (For scheduling questions, please contact rehab tech [phone])

## 2022-05-04 NOTE — ACP (Advance Care Planning) (Signed)
Patient Care Conference Note  [facility] Medicine & Comprehensive Care Follow Up   Service Spectralink: [phone]  Mon-Fri 9a-4pm    Patient Name: [name] [name]:      Meeting Date: 05/04/22         Purpose of Meeting  Discuss Plan of care  Discuss code status  Other medical power of attorney    Meeting Participants   Patient , Child (ren), and [facility]    Medical Decision Maker      Does [name] [name] have medical decision-making capacity?  Yes   The patient did  participate in the meeting.      Summary of Medical Condition/ Treatment Options/ Prognosis  [name] [name] is a 71 y.o. male with significant PMH of active smoker, and severe emphysema. Pt was [facility] on 05/01/2022 for new onset dyspnea  due to acute COPD and right sided pneumothorax s/p chest tube placed 6/3.  Palliative care services consulted : advance directive and clarify goals of care.      Patient's or Decision Maker's Perspective, Wishes, and Goals for Treatment    Completed DNR, does not want resuscitation. The patient wants supportive care with BIPAP, CIPAP and vasopressors if needed.  Wants first medical POA-son [name] [name] Keeter followed by spouse [name]   Grant agents' authority to make decisions regarding funeral arrangements   Does not want life prolonging treatment if death is imminent and medical treatment will not help recover  Does not want life prolonging treatment if condition make her unaware of self, surrounding and it is reasonably certain that she will never recover this awareness or ability.  Grant agents' authority to make mental health decisions  Does not want Artificial nutrition     Code Status and Medical Interventions Discussed   Code Status (patient has no pulse)  CPR - Comments: No CPR , NO intubation     Advance Directive Documentation  Existing Documents that were Reviewed/Discussed with Patient/Decision Maker:  None  Documents Filled Out as Part of Discussion:                 Durable DO NOT RESUSCITATE, Advance Directive/Living Will, and Power of Attorney for Health Care    Medical Decisions and Plan of Care  No CPR with support  Son and spouse as medical POA  No life prolonging treatment if death is imminent or if condition makes him unaware of surrounding       This information was shared with: Attending physician and bedside nurse    Signed by: Lisbeth RenshawJoysline Josselyne Onofrio, FNP  Capitola Ladson Chancy Milroyhia APRN,MSN, FNP-C  Palliative Medicine & Comprehensive Care  Phone Number: MV: [phone], Mon-Fri 9a-4p    Palliative Care Consult  Advanced Care Planning Face-to-Face    Date/Time: 05/04/22 3:30 PM  Patient Name: [name] [name],[name] [name]  Attending Physician: Laretta BolsterAnand, Kanchan, MD       Attendees: [name] [name]  who was willing to discuss advance care planing.  And son present at bedside  Reason for consult: [name] [name] 's current medical state is active smoker, and severe emphysema. [facility] on 05/01/2022 for new onset dyspnea  due to acute COPD and right sided pneumothorax s/p chest tube placed 6/3.  Medical history: active smoker    Understanding of illness: yes    Goals of Care/Patient Goals:  Completed DNR, does not want resuscitation. The patient wants supportive care with BIPAP, CIPAP and vasopressors  if needed.  Wants first medical POA-son [name] [name] followed by spouse Mrs. Hollace Kinnier Thi Larita Fife agents' authority to make decisions regarding funeral arrangements   Does not want life prolonging treatment if death is imminent and medical treatment will not help recover  Does not want life prolonging treatment if condition make her unaware of self, surrounding and it is reasonably certain that she will never recover this awareness or ability.  Grant agents' authority to make mental health decisions  Does not want Artificial nutrition   Goals/hopes/worries: to quit smoking and established a primary care physician when discharged    Personal preferences/values: continue with  recommended treatment plan    Treatment/intervention preferences: continue with recommended treatment plan    Benefits and burdens:     - Discussed the value and importance of advance care planning   - Discussed experience with loved one who have been seriously ill or have died.   - Exploration of personal, cultural, or spiritual beliefs that might influence medical decisions  - Exploration of goals of care in the event of a sudden injury or illness and with little chance of recovering the ability to know who they are or who they are with.     Capacity: patent  was assessed to have capacity to engage in a discussion about advance care planning, based upon the detailed discussion about the understanding of illness and goals of care that took place and their ability to process and understand the benefits and burdens of the treatment options/interventions that were presented and discussed.    Code Status: NO CPR - SUPPORT OK   Advanced Directives (as present in chart): Advance Directive: Patient does not have advance directive patient completed medical advance directive  Health Care Representative/Surrogate Decision Maker: medical POA first son [name] Carder Yin and second spouse Mrs Charlesetta Ivory    Review/completion of advance directives: Discussed while in the hospital,  Advance directive completed.Changed code status and completed DNR     Outcome/summary:   Advance care planning conversation included a discussion about:  - Identified of a healthcare agent: yes  - Reviewed and updated, or completed of an advance directive.  yes  - Entered a DNR order in the chart and give the patient documentation to take with him at discharge.       Follow-up/recommendations: established and follow up with primary care physician   Documented by Lisbeth Renshaw, FNP  Palliative Medicine & Comprehensive Care  Phone Number: MV: [phone], Mon-Fri 9a-4p

## 2022-05-04 NOTE — Progress Notes (Signed)
PULSE OXIMETRY TESTING     Patient's SpO2 at rest:   97% on oxygen, at rest on 1 LPM via NC     Patient's SpO2 with exertion (ambulate 100 ft w/ walker):    94% on oxygen, at 1 LPM via NC  HR in 110s, no more than 120 entire duration         (improvement f/ yesterday's walking O2 test, see note)                  05/04/22 1016 05/04/22 1020   Vital Signs   Patient Position Lying Walking   Oxygen Therapy   SpO2 97 % 94 %   O2 Device Nasal cannula Nasal cannula   O2 Flow Rate (L/min) 1 L/min 1 L/min

## 2022-05-05 ENCOUNTER — Inpatient Hospital Stay: Payer: Medicare Other

## 2022-05-05 DIAGNOSIS — Z720 Tobacco use: Secondary | ICD-10-CM

## 2022-05-05 LAB — BASIC METABOLIC PANEL
Anion Gap: 6 (ref 5.0–15.0)
BUN: 31 mg/dL — ABNORMAL HIGH (ref 9.0–28.0)
CO2: 34 mEq/L — ABNORMAL HIGH (ref 17–29)
Calcium: 9.2 mg/dL (ref 7.9–10.2)
Chloride: 102 mEq/L (ref 99–111)
Creatinine: 0.7 mg/dL (ref 0.5–1.5)
Glucose: 77 mg/dL (ref 70–100)
Potassium: 4.6 mEq/L (ref 3.5–5.3)
Sodium: 142 mEq/L (ref 135–145)
eGFR: >60 mL/min/{1.73_m2} (ref >=60)

## 2022-05-05 LAB — GLUCOSE WHOLE BLOOD - POCT
Whole Blood Glucose POCT: 83 mg/dL (ref 70–100)
Whole Blood Glucose POCT: 97 mg/dL (ref 70–100)
Whole Blood Glucose POCT: 146 mg/dL — ABNORMAL HIGH (ref 70–100)
Whole Blood Glucose POCT: 144 mg/dL — ABNORMAL HIGH (ref 70–100)

## 2022-05-05 MED ORDER — TAMSULOSIN HCL 0.4 MG PO CAPS
0.4000 mg | ORAL_CAPSULE | Freq: Every day | ORAL | Status: DC
Start: 2022-05-05 — End: 2022-05-07
  Administered 2022-05-05 – 2022-05-07 (×3): 0.4 mg via ORAL
  Filled 2022-05-05 (×3): qty 1

## 2022-05-05 NOTE — Progress Notes (Signed)
-  Patient complaining of urinary retention this am. Patient tried to void in urinal a few times but not able to void. Bladder scan showed 337 mls in bladder. Dr Theadora Rama was notified.    -Straight cath done as ordered; 350 mls removed.    Patient states he feels better.

## 2022-05-05 NOTE — Progress Notes (Addendum)
PULMONARY INPATIENT PROGRESS NOTE  [phone] (MD Spectralink #1) / [phone] (MD Spectralink #2) / [phone] (PA Spectralink)        Date Time: 05/05/22 9:56 AM  Patient Name: Neyra,Viola      Assessment:       Patient Active Problem List   Diagnosis    Bladder stone    Urinary retention    Benign prostatic hyperplasia with lower urinary tract symptoms    Right kidney stone    Acute on chronic respiratory failure, unspecified whether with hypoxia or hypercapnia     This is a 71 yo male with     COPD with exacerbation  R PTX  S/p chest tube  No PTX on CXR 6/5  Chest tube removed 6/7  Active tobacco use         Plan:   Repeat CXR  Continue Breo  Taper prednisone 40 qd x 3 days, 20 qd x 3 days, 10 qd x 3 days  Dispo: Home today if CXR without PTX.  Should see [place] in office in 2-3 weeks          Interval History     Interval History/24 hr. Events: Mild cough.  Feels fine.  Chest tube removed at bedside without obvious complication.      Review of Systems:   Reviewed and no changes      Physical Exam:     VITAL SIGNS PHYSICAL EXAM   Vitals:    05/05/22 0740   BP: 137/86   Pulse: 95   Resp: 14   Temp: 97.5 F (36.4 C)   SpO2: 97%     Temp (24hrs), Avg:97.3 F (36.3 C), Min:97.2 F (36.2 C), Max:97.5 F (36.4 C)      Intake and Output Summary (Last 24 hours) at Date Time    Intake/Output Summary (Last 24 hours) at 05/05/2022 0956  Last data filed at 05/05/2022 0900  Gross per 24 hour   Intake 510 ml   Output 600 ml   Net -90 ml             SpO2: 97 % (05/05/2022  7:40 AM)  O2 Device: Nasal cannula (05/05/2022  7:40 AM)  FiO2: 35 % (05/02/2022  6:00 PM)  O2 Flow Rate (L/min): 2 L/min (05/05/2022  7:40 AM)     Physical Exam  General: awake, alert, breathing comfortably, no acute distress  Cardiovascular: regular rate and rhythm, normal S1, S2, no S3, no S4, no murmurs, rubs or gallops  Neck: No JVD  Lungs: decreased breath sounds  Abdomen: soft, non-tender, non-distended; no palpable masses,  normoactive bowel sounds  Extremities: no  edema  Pulse: equal pulses,  Neurological: Alert and oriented X3, mood and affect normal         Meds:     Current Facility-Administered Medications   Medication Dose Route Frequency    albuterol-ipratropium  3 mL Nebulization Q4H SCH    fluticasone furoate-vilanterol  1 puff Inhalation QAM    heparin (porcine)  5,000 Units Subcutaneous Q8H SCH    insulin lispro  1-3 Units Subcutaneous QHS    insulin lispro  1-5 Units Subcutaneous TID AC    nicotine  1 patch Transdermal Daily    predniSONE  60 mg Oral QAM W/BREAKFAST    senna-docusate  1 tablet Oral Q12H [facility]       Labs:     Recent Labs   Lab 05/05/22  0543 05/04/22  9628 05/03/22  0611 05/02/22  1040  05/01/22  0516   WBC  --  17.88* 19.44* 17.47* 9.70*   RBC  --  4.04* 3.88* 3.97* 4.82   Hgb  --  11.3* 10.9* 11.2* 13.5   Hematocrit  --  37.1* 35.5* 36.6* 44.5   Glucose 77 122* 116* 133* 232*   BUN 31.0* 29.0* 22.0 21.0 16.0   Creatinine 0.7 0.7 0.7 0.7 0.9   Calcium 9.2 8.9 8.8 8.9 9.1   Sodium 142 142 144 142 142   Potassium 4.6 4.9 4.5 4.3 4.2   Chloride 102 105 109 109 106   CO2 34* 31* 30* 25 28           Imaging:         Outside imaging and imaging available in Epic were directly reviewed     Radiology Results (24 Hour)       ** No results found for the last 24 hours. **              Signed by: Loman ChromanMark J Micaiah Litle, MD    [facility]  Spectra [phone]

## 2022-05-05 NOTE — Plan of Care (Addendum)
Pt is alert and oriented x4, Sinus tach on tele with a HR in the 100-120s, otherwise VSS. On NC 1L/min  for comfort, O2 sat 98%. Chest tube in place connected to low suction -20, no drainage noted during shift. Dressing site is clean, dry and intact. Pt denies pain and SOB. Will continue to monitor throughout shift.    .Problem: Compromised Tissue integrity  Goal: Nutritional status is improving  Outcome: Progressing  Flowsheets (Taken 05/05/2022 0319)  Nutritional status is improving:   Assist patient with eating   Encourage patient to take dietary supplement(s) as ordered   Collaborate with Clinical Nutritionist     Problem: Inadequate Gas Exchange  Goal: Adequate oxygenation and improved ventilation  Outcome: Progressing  Flowsheets (Taken 05/05/2022 0319)  Adequate oxygenation and improved ventilation:   Assess lung sounds   Monitor SpO2 and treat as needed   Position for maximum ventilatory efficiency   Teach/reinforce use of incentive spirometer 10 times per hour while awake, cough and deep breath as needed   Plan activities to conserve energy: plan rest periods     Problem: Nutrition  Goal: Nutritional intake is adequate  Outcome: Progressing  Flowsheets (Taken 05/05/2022 0319)  Nutritional intake is adequate:   Assist patient with meals/food selection   Allow adequate time for meals   Encourage/perform oral hygiene as appropriate   Encourage/administer dietary supplements as ordered (i.e. tube feed, TPN, oral, OGT/NGT, supplements)   Assess anorexia, appetite, and amount of meal/food tolerated     Problem: Safety  Goal: Patient will be free from injury during hospitalization  Outcome: Progressing  Flowsheets (Taken 05/05/2022 0319)  Patient will be free from injury during hospitalization:   Assess patient's risk for falls and implement fall prevention plan of care per policy   Provide and maintain safe environment   Hourly rounding   Ensure appropriate safety devices are available at the bedside   Include patient/  family/ care giver in decisions related to safety  Goal: Patient will be free from infection during hospitalization  Outcome: Progressing  Flowsheets (Taken 05/05/2022 0319)  Free from Infection during hospitalization:   Assess and monitor for signs and symptoms of infection   Monitor all insertion sites (i.e. indwelling lines, tubes, urinary catheters, and drains)   Encourage patient and family to use good hand hygiene technique   Monitor lab/diagnostic results

## 2022-05-05 NOTE — Progress Notes (Signed)
MEDICINE PROGRESS NOTE  [facility], DIVISION OF HOSPITALIST MEDICINE   [facility]   [place] Pager: [phone]      Date Time: 05/05/22 2:40 PM  Patient Name: [name] [name],[name] [name]  Attending Physician: Laretta Bolster, MD  Hospital Day: 5  Assessment:     11 y old male who is active smoker with severe emphysema and presented on 6/3 with SOB and was found to have O2 sat in 86% by EMS and tachycardic and tachypneic and in ED was placed on BiPAP and CXR showed right small to moderate PTX and CT chest showed moderate PTX, s/p right Chest tube placement on 6/3 and was down graded to medical floor on 6/4.    Plan:     Acute COPD exacerbation/severe emphysema  Acute hypoxemic resp failure  Right sided pneumothorax s/p chest tube placement on 6/3  -c/w Duoneb,and decadron 8 mg IV q12h for today and will change to prednisone 60 mg from am  -stop Pulmicort nebulizer and start on Breo per pulm  -c/w azithromycin,change to po in am for another 2 days (has received 3 days already)  -s/pchest tube,CXR shows resolution of PTX and no output, discussed with pulmonologist by previous attending  -Is use was encouraged  -currently on 3 lits O2 via NC and O2 sat 99%,need to check O2 sat at -was able to wean off O2 today during rounds, but CXR with apical small PTX, would keep 2-3 L O2 vi NC  -recheck CXR in am  -smoking cessation was encouraged  -Reached out to pulmonologist today regarding removal of chest tube, cleared to remove-will reach out to IR regarding removal of chest tube.  -c/w nicotine patch   -PT/OT    Urinary retention - new today  --patient with new urinary retention today x 2 straight cath.  --start Flomax  --Urology consult      Sinus tachycardia   -Significantly tachycardic 6/6 in 120s  --today HR is better  -- Deconditioning also playing a role      Leucocytosis  -WBC 9.7-->17.47-->19, down to 17.8 likely due to steroids      Hyperglycemia due to steroids- resolved  -A1C 5.5%  -c/w SSI for  coverage      Cachexia/underweight/sever malnutrition  -consulted nutritionist      Palliative care consult since he has bad emphysema and there is no advance directive      PT/OT ordered       DVT QMV:HQIONGE sc    I spent 35 minutes on the unit in direct care for this patient  > 50% in counseling and coordination of care for conditions described in my assessment and plan.       Case was discussed with pt and RN  Discussed patient's dtr at bedside.      Lines:     Patient Lines/Drains/Airways Status       Active PICC Line / CVC Line / PIV Line / Drain / Airway / Intraosseous Line / Epidural Line / ART Line / Line / Wound / Pressure Ulcer / NG/OG Tube       Name Placement date Placement time Site Days    Peripheral IV 05/01/22 18 G Right Antecubital 05/01/22  0514  Antecubital  1    Peripheral IV 05/01/22 20 G Left Antecubital 05/01/22  0630  Antecubital  1                       Subjective/ROS/24 hr events:  CC:SOB,pneumothorax     I seen and examined the patient.  Patient's dtr is at bedside.  Patient is able to understand English however patient's dtr is available to interpret as per preference of the patient.   Pt having issues with urination, retention and s/p straight cath with UO 350 cc.  No chest pain.  Mild shortness of breath.      Physical Exam:     Temp:  [97.2 F (36.2 C)-97.9 F (36.6 C)] 97.9 F (36.6 C)  Heart Rate:  [95-123] 107  Resp Rate:  [14-20] 14  BP: (122-139)/(77-86) 122/82  Intake and Output Summary-last 24 Hrs:  I/O last 3 completed shifts:  In: 450 [P.O.:450]  Out: 1450 [Urine:1450]    O2 Flow Rate (L/min)  Avg: 2 L/min  Min: 2 L/min  Max: 2 L/min by O2 Device: Nasal cannula     General: AAOX3,on 3 lit O2 via NC,cachectic, in no acute distress,  Cardiovascular: tachycardia  Lungs: globally reduced air entry b/l lung but better today than yesterday,+chest tube right lateral chest  Abdomen: soft, non-tender, non-distended; normoactive bowel sounds  Extremities: no edema  Neurological:  Alert and oriented X 3, moves all extremities.   Skin:no rash or lesion  Foley: none  Central line:none       Meds:   Medications were reviewed:  Scheduled Meds:  Current Facility-Administered Medications   Medication Dose Route Frequency    albuterol-ipratropium  3 mL Nebulization Q4H SCH    fluticasone furoate-vilanterol  1 puff Inhalation QAM    heparin (porcine)  5,000 Units Subcutaneous Q8H SCH    insulin lispro  1-3 Units Subcutaneous QHS    insulin lispro  1-5 Units Subcutaneous TID AC    nicotine  1 patch Transdermal Daily    predniSONE  60 mg Oral QAM W/BREAKFAST    senna-docusate  1 tablet Oral Q12H SCH    tamsulosin  0.4 mg Oral Daily after dinner     Continuous Infusions:  PRN Meds:.acetaminophen  Labs/Radiology:   Imaging personally reviewed, including: all available   XR Chest AP Portable    Result Date: 05/05/2022   Small right apical pneumothorax status post chest tube removal Laurena Slimmer, MD 05/05/2022 1:42 PM     Recent Labs     05/05/22  1113 05/05/22  0623 05/04/22  2024 05/04/22  1627   Whole Blood Glucose POCT 97 83 124* 140*       Recent Labs   Lab 05/05/22  0543 05/04/22  0608 05/03/22  0611 05/02/22  1040   Sodium 142 142 144 142   Potassium 4.6 4.9 4.5 4.3   Chloride 102 105 109 109   BUN 31.0* 29.0* 22.0 21.0   Creatinine 0.7 0.7 0.7 0.7   eGFR >60.0 >60.0 >60.0 >60.0   Glucose 77 122* 116* 133*   Calcium 9.2 8.9 8.8 8.9       Recent Labs   Lab 05/04/22  0608 05/03/22  0611 05/02/22  1040 05/01/22  0516   WBC 17.88* 19.44* 17.47* 9.70*   Hgb 11.3* 10.9* 11.2* 13.5   Hematocrit 37.1* 35.5* 36.6* 44.5   Platelets 260 259 244 330           Recent Labs   Lab 05/01/22  0516   Alkaline Phosphatase 86   Bilirubin, Total 0.3   ALT 17   AST (SGOT) 30       Recent Labs   Lab 05/01/22  0516   Procalcitonin 0.03  Signed by: Almeta Monas, MD

## 2022-05-05 NOTE — Plan of Care (Addendum)
-Patient is a minimal assist to chair. Patient walked around the unit. HR in the 130's  with some SOB. Saturation 94% RA.    Patient was retaining. Not able to void had discomfort. Straight cathed x2. Patient continued to retain with discomfort. Foley catheter inserted.    Patient is on 2 LNC post CXR results as ordered by MD.    -Patient A&Ox4, ST on telemetry.    -Has call light within reach, bed on lowest position.  Patient agrees to call for assist. Will continue to monitor patient closely.    Problem: Inadequate Gas Exchange  Goal: Adequate oxygenation and improved ventilation  Outcome: Progressing  Flowsheets (Taken 05/05/2022 1759)  Adequate oxygenation and improved ventilation:   Assess lung sounds   Monitor SpO2 and treat as needed   Position for maximum ventilatory efficiency   Provide mechanical and oxygen support to facilitate gas exchange   Teach/reinforce use of incentive spirometer 10 times per hour while awake, cough and deep breath as needed   Plan activities to conserve energy: plan rest periods   Increase activity as tolerated/progressive mobility   Consult/collaborate with Respiratory Therapy  Goal: Patent Airway maintained  Outcome: Progressing  Flowsheets (Taken 05/05/2022 1759)  Patent airway maintained:   Position patient for maximum ventilatory efficiency   Provide adequate fluid intake to liquefy secretions   Reinforce use of ordered respiratory interventions (i.e. CPAP, BiPAP, Incentive Spirometer, Acapella, etc.)     Problem: Nutrition  Goal: Nutritional intake is adequate  Outcome: Progressing  Flowsheets (Taken 05/05/2022 1759)  Nutritional intake is adequate: Assist patient with meals/food selection     Problem: Safety  Goal: Patient will be free from injury during hospitalization  Outcome: Progressing  Flowsheets (Taken 05/05/2022 1759)  Patient will be free from injury during hospitalization:   Assess patient's risk for falls and implement fall prevention plan of care per policy   Use  appropriate transfer methods   Ensure appropriate safety devices are available at the bedside   Include patient/ family/ care giver in decisions related to safety   Hourly rounding   Provide and maintain safe environment     Problem: Compromised Hemodynamic Status  Goal: Vital signs and fluid balance maintained/improved  Outcome: Progressing  Flowsheets (Taken 05/05/2022 1759)  Vital signs and fluid balance are maintained/improved:   Position patient for maximum circulation/cardiac output   Monitor/assess vitals and hemodynamic parameters with position changes   Monitor/assess lab values and report abnormal values     Problem: Inadequate Airway Clearance  Goal: Normal respiratory rate/effort achieved/maintained  Outcome: Progressing  Flowsheets (Taken 05/05/2022 1759)  Normal respiratory rate/effort achieved/maintained: Plan activities to conserve energy: plan rest periods     Problem: Impaired Mobility  Goal: Mobility/Activity is maintained at optimal level for patient  Outcome: Progressing  Flowsheets (Taken 05/05/2022 1759)  Mobility/activity is maintained at optimal level for patient:   Increase mobility as tolerated/progressive mobility   Maintain proper body alignment   Encourage independent activity per ability   Plan activities to conserve energy, plan rest periods   Assess for changes in respiratory status, level of consciousness and/or development of fatigue   Perform active/passive ROM     Problem: Bladder/Voiding  Goal: Remains continent  Outcome: Progressing  Flowsheets (Taken 05/05/2022 1800)  Remains continent:   Initiate bladder training program   Monitor intake and output   Encourage patient to empty bladder at regular intervals   Utilize bladder scans prior to or post void as appropriate  Encourage patient to call for help when getting up to use the bathroom   Encourage patient to identify medications that aid bladder function prior to administration   Ambulate patient to the bathroom with appropriate  assistance  Goal: Perineal skin integrity is maintained or improved  Outcome: Progressing  Flowsheets (Taken 05/05/2022 1800)  Perineal skin integrity is maintained or improved:   Keep intact skin clean and dry   Apply urinary containment device as appropriate and/or per order   Use protective skin barriers to decrease potential skin breakdown  Goal: Free from infection  Outcome: Progressing  Flowsheets (Taken 05/05/2022 1800)  Free from infection:   Monitor/assess for signs and symptoms of infection   Assess need for indwelling catheter every shift and discuss with LIP  Goal: Patient will experience proper bladder emptying during admission  Outcome: Progressing  Flowsheets (Taken 05/05/2022 1800)  Patient will experience proper bladder emptying during admission:   Monitor intake and output   Encourage patient to empty bladder at regular intervals   Utilize bladder scans prior to or post void as appropriate

## 2022-05-05 NOTE — PT Progress Note (Signed)
Physical Therapy Treatment  [name]  Post Acute Care Therapy Recommendations:     Discharge Recommendations:  Home with supervision, Home with home health PT      DME needs IF patient is discharging home: Front wheel walker, Grab bars, Shower chair    Therapy discharge recommendations may change with patient status.  Please refer to most recent note for up-to-date recommendations.           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    Interpreter utilized: no, not indicated    Assessment   Pt standing at EOB with urinal, as pt has ongoing inability to void voluntarily.  Daughter present throughout session.  Pt ambulates with FWW around RN station and returns to room.  HR 141, increasing as high as 146, post ambulation and O2 91% on room air.  Cues for deep breathing at EOB, after ~8 minutes pt lies down and continues deep breathing.  RN aware of pt's tachycardia.  After ~11 minutes of rest pt's HR in mid 120s.  Pt left with all needs in reach.     Pt fatigues quickly, and requires cues for deep, slow breathing to decrease HR.   PMP - Progressive Mobility Protocol   PMP Activity: Step 7 - Walks out of Room  Distance Walked (ft) (Step 6,7): 220 Feet     Plan     Continue plan of care.    Interdisciplinary Communication: RN.      Subjective   Pt's daughter reporting pt's urinary retention issues came on  quickly this morning.   Patient is agreeable to participation in the therapy session. Family and/or guardian are agreeable to patient's participation in the therapy session. Nursing clears patient for therapy.  Pain: pt denies pain    Vitals: See above for treatment details.  Vitals:    05/05/22 0433 05/05/22 0740 05/05/22 1111 05/05/22 1442   BP: 130/77 137/86 122/82 (!) 164/98   Pulse: (!) 107 95 (!) 107 (!) 134   Resp: 18 14 14     Temp: 97.3 F (36.3 C) 97.5 F (36.4 C) 97.9 F (36.6 C)    TempSrc: Oral Oral Oral    SpO2: 98% 97% 98% 95%   Weight:       Height:           Objective     Precautions/ Contraindications:   Precautions  Weight Bearing Status: no restrictions  Other Precautions: Drop in O2, tachy cardia, fall, new urinary retention    Patient is  standing at EOB  with telemetry and urinal  in place.        Functional Mobility:  Sit to Supine: Independent  Sit to Stand: SUP  Stand to Sit: Independent    Gait:  WB status: FWB  Assistive Device: FWW  Assist Level: CGA/SBA  Distance: 220'  Pattern: Slight listing to R.     Educated the patient to role of physical therapy, plan of care, goals  of therapy, rationale for progressing mobility and safety with mobility and ADLs, energy conservation techniques, and pursed lip breathing.    RN notified of session outcome and that patient was left in bed with all needs met and equipment intact.   Safety measures include: handoff to nurse/clin tech/ unit secretary completed, oriented to call bell and placed within reach, personal items within reach, bed placed in lowest position, and family/caregiver at bedside.   Mobility and ADL status posted at bedside and within E.M.R.    Goals per Eval/ Re-eval:   Goals  Goal Formulation: With patient/family  Time for Goal Acheivement: 5 visits  Goals: Select goal  Pt Will Go Supine To Sit: modified independent  Pt Will Perform Sit To Supine: modified independent  Pt Will Perform Sit to Stand: modified independent  Pt Will  Transfer Bed/Chair: modified independent  Pt Will Ambulate: > 200 feet, with rolling walker, modified independent  Pt Will Go Up / Down Stairs: 1 flight, with supervision        Therapist PPE during session procedural mask and gloves     Signature:  Ladene Artist, LPTA  05/05/2022 3:47 PM     (For scheduling questions, please contact rehab tech [phone])

## 2022-05-05 NOTE — Progress Notes (Signed)
1357 - RN HH liaison notified regarding DCP and therapy recommendations.     Valetta Close, BSN RN  Case Manager  [facility]

## 2022-05-05 NOTE — Progress Notes (Signed)
Dr Illene Bolus removed chest tube at bedside at 10:00 am. Incision site was covered  with Vaseline gauze and dry gauze. Dressing CDI.

## 2022-05-05 NOTE — Progress Notes (Signed)
Initial Case Management Assessment and Discharge Planning  [facility]   Patient Name: [name] [name], [name] [name]   Date of Birth [birth year]   Attending Physician: Laretta Bolster, MD   Primary Care Physician: Pcp, None, MD   Length of Stay 4   Reason for Consult / Chief Complaint Acute on chronic respiratory failure/Discharge planning        Situation   Admission DX:   1. Acute on chronic respiratory failure, unspecified whether with hypoxia or hypercapnia    2. Emphysema with chronic bronchitis    3. Pneumothorax, unspecified type    4. Secondary spontaneous pneumothorax        A/O Status: X 3    LACE Score: 9    Patient admitted from: ER  Admission Status: inpatient    Health Care Agent: Child  Name: Joanthony Hamza (Son)  Phone number: [phone]       Background     Advanced directive:   Received        Code Status:   NO CPR - SUPPORT OK     Residence: Multi-story home    PCP: PCP None, MD  Patient Contact:   [phone] (home)     [phone] (mobile)     Emergency contact:   Extended Emergency Contact Information  Primary Emergency Contact: Samons,Thai  Mobile Phone: [phone]  Relation: Son  Interpreter needed? No      ADL/IADL's: Independent  Previous Level of function: 7 Independent     DME:  Walk-in shower    Pharmacy:   No Pharmacies Listed    Prescription Coverage: Yes    Home Health: The patient is not currently receiving home health services.    Previous SNF/AR: N/A    COVID Vaccine Status: 2 doses    Date First IMM given:   UAI on file?: No  Transport for discharge? Mode of transportation: Sales executive - Family/Friend to drive patient  Agreeable to Home Health Services post-discharge:  Yes     Assessment     BARRIERS TO DISCHARGE: Pending medical stability     Recommendation   D/C Plan A: Home with home health    D/C Plan B: Home with family    D/C Plan C:

## 2022-05-06 ENCOUNTER — Inpatient Hospital Stay: Payer: Medicare Other

## 2022-05-06 DIAGNOSIS — R339 Retention of urine, unspecified: Secondary | ICD-10-CM

## 2022-05-06 DIAGNOSIS — N200 Calculus of kidney: Secondary | ICD-10-CM

## 2022-05-06 DIAGNOSIS — N400 Enlarged prostate without lower urinary tract symptoms: Secondary | ICD-10-CM

## 2022-05-06 LAB — BASIC METABOLIC PANEL
Anion Gap: 10 (ref 5.0–15.0)
BUN: 28 mg/dL (ref 9.0–28.0)
CO2: 33 mEq/L — ABNORMAL HIGH (ref 17–29)
Calcium: 8.5 mg/dL (ref 7.9–10.2)
Chloride: 101 mEq/L (ref 99–111)
Creatinine: 0.7 mg/dL (ref 0.5–1.5)
Glucose: 81 mg/dL (ref 70–100)
Potassium: 4.3 mEq/L (ref 3.5–5.3)
Sodium: 144 mEq/L (ref 135–145)
eGFR: >60 mL/min/{1.73_m2} (ref >=60)

## 2022-05-06 LAB — GLUCOSE WHOLE BLOOD - POCT
Whole Blood Glucose POCT: 83 mg/dL (ref 70–100)
Whole Blood Glucose POCT: 92 mg/dL (ref 70–100)
Whole Blood Glucose POCT: 160 mg/dL — ABNORMAL HIGH (ref 70–100)
Whole Blood Glucose POCT: 117 mg/dL — ABNORMAL HIGH (ref 70–100)

## 2022-05-06 NOTE — PT Progress Note (Signed)
Physical Therapy Note    Patient: [name] [name]  ZOX:[id number]    Unit: MI627/MI627-01    Patient not seen for physical therapy secondary to first atut tempt pt eating lunch. Noted O2 NC 1/2 way in, so repositioned it and educated pt/daughter about optimal NC positioning. Returned following lunch, and pt was just being transported off the unit for CXR. Will continue to follow as pt available.    Chester Holstein, PT  05/06/2022  1:46 PM  [id number]  [phone] for Rehab Tech/scheduling information)

## 2022-05-06 NOTE — Progress Notes (Signed)
PULMONARY INPATIENT PROGRESS NOTE  [phone] (MD Spectralink #1) / [phone] (MD Spectralink #2) / [phone] (PA Spectralink)        Date Time: 05/06/22 4:04 PM  Patient Name: [name] [name],[name] [name]      Assessment:       Patient Active Problem List   Diagnosis    Bladder stone    Urinary retention    Benign prostatic hyperplasia with lower urinary tract symptoms    Right kidney stone    Acute on chronic respiratory failure, unspecified whether with hypoxia or hypercapnia     This is a 71 yo male with     COPD with exacerbation  R PTX  S/p chest tube  No PTX on CXR 6/5  Chest tube removed 6/7 with post removal PTX - stable on 6/8 CXR  Active tobacco use         Plan:   Recommend CT chest to better assess PTX  Continue Breo  Continue prednisone taper  Keep on O2  Encourage IS use, OOB, ambulation            Interval History     Interval History/24 hr. Events:  Chart reviewed.  CXR with PTX yesterday and this afternoon.  Otherwise stable.      Review of Systems:   Reviewed and no changes      Physical Exam:     VITAL SIGNS PHYSICAL EXAM   Vitals:    05/06/22 1101   BP: 115/70   Pulse: (!) 103   Resp: 14   Temp: 97.5 F (36.4 C)   SpO2: 100%     Temp (24hrs), Avg:97.6 F (36.4 C), Min:97.3 F (36.3 C), Max:97.9 F (36.6 C)      Intake and Output Summary (Last 24 hours) at Date Time    Intake/Output Summary (Last 24 hours) at 05/06/2022 1604  Last data filed at 05/06/2022 1300  Gross per 24 hour   Intake 1000 ml   Output 1950 ml   Net -950 ml               SpO2: 100 % (05/06/2022 11:01 AM)  O2 Device: Nasal cannula (05/06/2022 11:01 AM)  O2 Flow Rate (L/min): 4 L/min (05/06/2022 11:01 AM)     Physical Exam         Meds:     Current Facility-Administered Medications   Medication Dose Route Frequency    albuterol-ipratropium  3 mL Nebulization Q4H SCH    fluticasone furoate-vilanterol  1 puff Inhalation QAM    heparin (porcine)  5,000 Units Subcutaneous Q8H SCH    insulin lispro  1-3 Units Subcutaneous QHS    insulin lispro  1-5 Units  Subcutaneous TID AC    nicotine  1 patch Transdermal Daily    predniSONE  60 mg Oral QAM W/BREAKFAST    senna-docusate  1 tablet Oral Q12H [facility]    tamsulosin  0.4 mg Oral Daily after dinner       Labs:     Recent Labs   Lab 05/06/22  0600 05/05/22  0543 05/04/22  0608 05/03/22  0611 05/02/22  1040 05/01/22  0516   WBC  --   --  17.88* 19.44* 17.47* 9.70*   RBC  --   --  4.04* 3.88* 3.97* 4.82   Hgb  --   --  11.3* 10.9* 11.2* 13.5   Hematocrit  --   --  37.1* 35.5* 36.6* 44.5   Glucose 81 77 122* 116* 133* 232*  BUN 28.0 31.0* 29.0* 22.0 21.0 16.0   Creatinine 0.7 0.7 0.7 0.7 0.7 0.9   Calcium 8.5 9.2 8.9 8.8 8.9 9.1   Sodium 144 142 142 144 142 142   Potassium 4.3 4.6 4.9 4.5 4.3 4.2   Chloride 101 102 105 109 109 106   CO2 33* 34* 31* 30* 25 28             Imaging:         Outside imaging and imaging available in Epic were directly reviewed     Radiology Results (24 Hour)       Procedure Component Value Units Date/Time    XR Chest 2 Views [[id number]] Collected: 05/06/22 1409    Order Status: Completed Updated: 05/06/22 1415    Narrative:      History: f/u Pneumothorax with comparison to June 7      PA and lateral views demonstrates stable posttreatment changes bilaterally. There is a persistant small residual right apical pneumothorax seen best on the lateral.    There is stable blunting of the costophrenic angles      Impression:       Persistent small right apical pneumothorax now best seen on the lateral film. This has not worsened since prior    Laurena Slimmer, MD  05/06/2022 2:12 PM              Signed by: Loman Chroman, MD    [facility]  Spectra [phone]

## 2022-05-06 NOTE — Progress Notes (Signed)
Nutrition Support Services  [facility]   Main Office Telephone: [phone]    Nutrition Follow-up    Justn Quale 71 y.o. male   MRN: [id number]    Summary of Nutrition Recommendations:  Continue current diet order  Continue ensure hi plus hi protein   RN to document daily % po intake     -----------------------------------------------------------------------------------------------------------------                                                           ASSESSMENT DATA     Subjective Nutrition: Pt met at bedside, reporting eating ok. Pt stating he drinks at least one ensure drink in a day- x3 unopened bottles in room. Encouraged pt to sip ensure through the day. Per chart, pt with 75-100% meal intake. Last BM on 6/7. Pt denied any other issues or concerns.    Learning Needs: Continued to discuss importance of adequate protein and energy intake.    Events of Current Admission:  29 y old male who is active smoker with severe emphysema and presented on 6/3 with SOB and was found to have O2 sat in 86% by EMS and tachycardic and tachypneic and in ED was placed on BiPAP and CXR showed right small to moderate PTX and CT chest showed moderate PTX, s/p right Chest tube placement on 6/3 and was down graded to medical floor on 6/4.    Medical Hx:  has a past medical history of Collapsed lung, Emphysema lung, and Enlarged prostate.     Active Hospital Problems    Diagnosis    Acute on chronic respiratory failure, unspecified whether with hypoxia or hypercapnia       Orders Placed This Encounter   Procedures    Diet regular    Ensure Plus Hi Protein Supplement Quantity: A. One; Frequency: TID (3 times a day) with meals         ANTHROPOMETRIC  Height: 157.5 cm (5\' 2" )  Weight: (!) 30.8 kg (67 lb 14.4 oz) (PER RECORD)     Weight Change: 0  IBW/kg (Calculated) Male: 53.64 kg  IBW/kg (Calculated) Male: 50 kg        Body mass index is 12.42 kg/m.       NFPE:  Head: temple region: hollowing, scooping, depression  with little to no muscle tone/resistance (severe muscle loss - temporalis), orbital region: slightly dark circles, somewhat hollow look, some decrease in bounce back of fat pads (moderate fat loss), and buccal region: hollow, sunken, narrow cheeks, prominence of bony structure, minimal to no bounce back of fat pads (severe fat loss)  Upper Body: clavicle bone region: protruding, prominent bone, no bounce back in muscle tone/resistance with striated/stringy feel, fingers able to palpate under clavicle (severe muscle loss - pectoralis major), shoulder and acromion bone region: prominent protrusion of acromion process and bones, squared off appearance of shoulders, minimal to no muscle tone/resistance (severe muscle loss - deltoid), upper arm region: fingers touch with minimal fat, very little space between (severe fat loss), dorsal hand region: deep depression, minimal to no muscle tone/resistance (severe muscle loss - interosseous), and scapular bone region: prominent, visible bones, depressions around scapula bone with minimal to no resistance in muscles (severe muscle loss - trapezius, supraspinatus, infraspinatus)  Lower Body: anterior thigh and patellar region: noticeable  depressions along the thigh, patella prominent, square appearance, minimal to no muscle tone/resistance in quadriceps to patella (severe muscle loss - quadriceps), posterior calf region: absence of bulb shape, minimal to no muscle tone/resistance (severe muscle loss - gastrocnemius), and edema: no sign of fluid accumulation    ESTIMATED NEEDS    Total Daily Energy Needs: 924 to 1078 kcal  Method for Calculating Energy Needs: 30 kcal - 35 kcal per kg  at 30.8 kg (Actual body weight)        Total Daily Protein Needs: 46.2 to 52.36 g  Method for Calculating Protein Needs: 1.5 g - 1.7 g per kg at 30.8 kg (Actual body weight)       Total Daily Fluid Needs: 770 to 770 ml  Method for Calculating Fluid Needs: 25 ml - 25 ml  per kg at 30.8 kg (Actual  body weight)  Rationale: or per MD    Pertinent Medications:   IVF:    albuterol-ipratropium, 3 mL, Q4H SCH  fluticasone furoate-vilanterol, 1 puff, QAM  heparin (porcine), 5,000 Units, Q8H SCH  insulin lispro, 1-3 Units, QHS  insulin lispro, 1-5 Units, TID AC  nicotine, 1 patch, Daily  predniSONE, 60 mg, QAM W/BREAKFAST  senna-docusate, 1 tablet, [facility]12H [facility]  tamsulosin, 0.4 mg, Daily after dinner        Pertinent labs:  Recent Labs   Lab 05/06/22  0600 05/05/22  0543 05/04/22  0608 05/03/22  0611 05/02/22  1040 05/01/22  0516   Sodium 144 142 142 144 142 142   Potassium 4.3 4.6 4.9 4.5 4.3 4.2   Chloride 101 102 105 109 109 106   CO2 33* 34* 31* 30* 25 28   BUN 28.0 31.0* 29.0* 22.0 21.0 16.0   Creatinine 0.7 0.7 0.7 0.7 0.7 0.9   Glucose 81 77 122* 116* 133* 232*   Calcium 8.5 9.2 8.9 8.8 8.9 9.1   Magnesium  --   --   --  1.8 1.7  --    Phosphorus  --   --   --  3.2 2.8  --    eGFR >60.0 >60.0 >60.0 >60.0 >60.0 >60.0   WBC  --   --  17.88* 19.44* 17.47* 9.70*   Hematocrit  --   --  37.1* 35.5* 36.6* 44.5   Hgb  --   --  11.3* 10.9* 11.2* 13.5   Hemoglobin A1C  --   --   --  5.5  --   --                                                          NUTRITION DIAGNOSIS     Severe Malnutrition related to increased protein needs in the context of chronic illness as evidenced by severe muscle loss (temporalis, pectoralis, deltoid, interosseous, trapezius, supraspinatus, infraspinatus, quadriceps, gastrocnemius) and severe fat loss (buccal, triceps).    Underweight related to chronic illness and decreased intake as evidenced by low BMI                                                           INTERVENTION  Nutrition recommendation - Please refer to top of note                                                       MONITORING/EVALUATION     Goals: Pt to meet 75% of estimated energy needs during hospital stay.     Monitor: po intake, labs, GOC  Follow up:  5-7 days     Haze Boyden, MS, RDN  Clinical Dietitian

## 2022-05-06 NOTE — Consults (Signed)
UROLOGY CONSULT NOTE    Date Time: 05/06/22 8:58 AM  Patient Name: [name] [name],[name] [name]  Attending Physician: Laretta Bolster, MD    Assessment & Plan   -- BPH: enlarged prostate per exam  -- Urinary retention: patient was started on flomax and foley was inserted. Continue current treatment. Will plan to do void trial likely next week.  -- S/p pneumothorax, treated in ICU, resolved  -- Leukocytosis: downtrending, secondary to steroids  -- Emphysema    The assessment and plan were discussed with Dr. Donzetta Starch MD and formulated together as documented.      History of Present Illness:   [name] [name] is a 71 y.o. male PMHx BPH, emphysema, smoking, urinary retention and cystolitholapaxy in 2021, who presents to the hospital with pneumothorax. Patient was treated with chest tube, abx, pulmicort, duoneb. Patient developed urinary retention yesterday and foley was placed. Currently denies f/c/n/v/d, dysuria or hematuria. WBC down trending, VS stable    Past Medical History:     Past Medical History:   Diagnosis Date    Collapsed lung     Emphysema lung     Enlarged prostate        Past Surgical History:     Past Surgical History:   Procedure Laterality Date    CYSTOSCOPY, LITHOLAPAXY N/A 01/04/2020    Procedure: Gabriel Rainwater;  Surgeon: Miquel Dunn, MD;  Location: [facility];  Service: Urology;  Laterality: N/A;  CYSTOLITHOLAPAXY    PROSTATE SURGERY         Family History:   No family history on file.    Social History:     Social History     Socioeconomic History    Marital status: Unknown     Spouse name: Not on file    Number of children: Not on file    Years of education: Not on file    Highest education level: Not on file   Occupational History    Not on file   Tobacco Use    Smoking status: Every Day     Years: 50.00     Types: Cigarettes    Smokeless tobacco: Never   Vaping Use    Vaping status: Never Used   Substance and Sexual Activity    Alcohol use: Never    Drug use: Never    Sexual activity: Not on file   Other  Topics Concern    Not on file   Social History Narrative    Not on file     Social Determinants of Health     Financial Resource Strain: Not on file   Food Insecurity: Not on file   Transportation Needs: Not on file   Physical Activity: Not on file   Stress: Not on file   Social Connections: Not on file   Intimate Partner Violence: Not on file   Housing Stability: Not on file       Allergies:   No Known Allergies    Medications:     Medications Prior to Admission   Medication Sig    oxyCODONE (ROXICODONE) 5 MG immediate release tablet Take 1 tablet (5 mg total) by mouth every 4 (four) hours as needed for Pain    phenazopyridine (PYRIDIUM) 200 MG tablet Take 1 tablet (200 mg total) by mouth 3 (three) times daily as needed for Pain       Review of Systems:   A comprehensive review of systems was:     General: no fever or chills  Heart: no CP, or palpitations  Lungs: no SOB or cough  Abdomen: no abdominal pain, nausea, vomiting, or diarrhea  Extremities: no weakness, numbness, or swelling  GU: no hematuria, dysuria, urgency or frequency   Skin: no rashes, bruises, lesions    Physical Exam:     Vitals:    05/06/22 0806   BP: 126/76   Pulse: 87   Resp: 14   Temp: 97.7 F (36.5 C)   SpO2: 100%       Intake and Output Summary (Last 24 hours) at Date Time    Intake/Output Summary (Last 24 hours) at 05/06/2022 0858  Last data filed at 05/06/2022 0600  Gross per 24 hour   Intake 1110 ml   Output 2875 ml   Net -1765 ml       General: awake, alert, in NAD  HEENT: no icterus  Neck: supple, no lymphadenopathy  Heart: RRR  Lungs: breathing comfortably on RA. No use of accessory muscles  Abdomen: Nontender. Nondistended. Normoactive bowel sounds  GU: bilateral descended testicles, normal circumcised penis. Enlarged prostate   Extremities: no deformities or edema  Skin: dry      Labs:     Results       Procedure Component Value Units Date/Time    Basic Metabolic Panel [[id number]]  (Abnormal) Collected: 05/06/22 0600    Specimen: Blood  Updated: 05/06/22 0811     Glucose 81 mg/dL      BUN 47.8 mg/dL      Creatinine 0.7 mg/dL      Calcium 8.5 mg/dL      Sodium 295 mEq/L      Potassium 4.3 mEq/L      Chloride 101 mEq/L      CO2 33 mEq/L      Anion Gap 10.0     eGFR >60.0 mL/min/1.73 m2     Glucose Whole Blood - POCT [[id number]] Collected: 05/06/22 0625     Updated: 05/06/22 0628     Whole Blood Glucose POCT 83 mg/dL     Glucose Whole Blood - POCT [[id number]]  (Abnormal) Collected: 05/05/22 2020     Updated: 05/05/22 2038     Whole Blood Glucose POCT 144 mg/dL     Glucose Whole Blood - POCT [[id number]]  (Abnormal) Collected: 05/05/22 1556     Updated: 05/05/22 1621     Whole Blood Glucose POCT 146 mg/dL     Glucose Whole Blood - POCT [[id number]] Collected: 05/05/22 1113     Updated: 05/05/22 1127     Whole Blood Glucose POCT 97 mg/dL     Culture Blood Aerobic and Anaerobic [[id number]] Collected: 05/01/22 0520    Specimen: Blood, Venipuncture Updated: 05/05/22 1121    Narrative:      ORDER#: [id number]                                    ORDERED BY: HOLLIDAY, NICOL  SOURCE: Blood, Venipuncture arm                      COLLECTED:  05/01/22 05:20  ANTIBIOTICS AT COLL.:                                RECEIVED :  05/01/22 10:34  Culture Blood Aerobic and Anaerobic        PRELIM  05/05/22 11:21  05/02/22   No Growth after 1 day/s of incubation.  05/03/22   No Growth after 2 day/s of incubation.  05/04/22   No Growth after 3 day/s of incubation.  05/05/22   No Growth after 4 day/s of incubation.      Culture Blood Aerobic and Anaerobic [[id number]] Collected: 05/01/22 0520    Specimen: Blood, Intravenous Line Updated: 05/05/22 1121    Narrative:      Peripheral Line Draw.  ORDER#: [id number]                                    ORDERED BY: HOLLIDAY, NICOL  SOURCE: Blood, Intravenous Line PIV                  COLLECTED:  05/01/22 05:20  ANTIBIOTICS AT COLL.:                                RECEIVED :  05/01/22 10:34  Culture Blood Aerobic and Anaerobic         PRELIM      05/05/22 11:21  05/02/22   No Growth after 1 day/s of incubation.  05/03/22   No Growth after 2 day/s of incubation.  05/04/22   No Growth after 3 day/s of incubation.  05/05/22   No Growth after 4 day/s of incubation.                Results       Procedure Component Value Units Date/Time    Basic Metabolic Panel [[id number]]  (Abnormal) Collected: 05/06/22 0600    Specimen: Blood Updated: 05/06/22 0811     Glucose 81 mg/dL      BUN 80.0 mg/dL      Creatinine 0.7 mg/dL      Calcium 8.5 mg/dL      Sodium 349 mEq/L      Potassium 4.3 mEq/L      Chloride 101 mEq/L      CO2 33 mEq/L      Anion Gap 10.0     eGFR >60.0 mL/min/1.73 m2     Glucose Whole Blood - POCT [[id number]] Collected: 05/06/22 0625     Updated: 05/06/22 0628     Whole Blood Glucose POCT 83 mg/dL     Glucose Whole Blood - POCT [[id number]]  (Abnormal) Collected: 05/05/22 2020     Updated: 05/05/22 2038     Whole Blood Glucose POCT 144 mg/dL     Glucose Whole Blood - POCT [[id number]]  (Abnormal) Collected: 05/05/22 1556     Updated: 05/05/22 1621     Whole Blood Glucose POCT 146 mg/dL     Glucose Whole Blood - POCT [[id number]] Collected: 05/05/22 1113     Updated: 05/05/22 1127     Whole Blood Glucose POCT 97 mg/dL     Culture Blood Aerobic and Anaerobic [[id number]] Collected: 05/01/22 0520    Specimen: Blood, Venipuncture Updated: 05/05/22 1121    Narrative:      ORDER#: [id number]                                    ORDERED BY: HOLLIDAY, NICOL  SOURCE: Blood, Venipuncture arm  COLLECTED:  05/01/22 05:20  ANTIBIOTICS AT COLL.:                                RECEIVED :  05/01/22 10:34  Culture Blood Aerobic and Anaerobic        PRELIM      05/05/22 11:21  05/02/22   No Growth after 1 day/s of incubation.  05/03/22   No Growth after 2 day/s of incubation.  05/04/22   No Growth after 3 day/s of incubation.  05/05/22   No Growth after 4 day/s of incubation.      Culture Blood Aerobic and Anaerobic [[id number]][[id number]] Collected: 05/01/22  0520    Specimen: Blood, Intravenous Line Updated: 05/05/22 1121    Narrative:      Peripheral Line Draw.  ORDER#: [id number]                                    ORDERED BY: HOLLIDAY, NICOL  SOURCE: Blood, Intravenous Line PIV                  COLLECTED:  05/01/22 05:20  ANTIBIOTICS AT COLL.:                                RECEIVED :  05/01/22 10:34  Culture Blood Aerobic and Anaerobic        PRELIM      05/05/22 11:21  05/02/22   No Growth after 1 day/s of incubation.  05/03/22   No Growth after 2 day/s of incubation.  05/04/22   No Growth after 3 day/s of incubation.  05/05/22   No Growth after 4 day/s of incubation.                Rads:   Radiological Procedure reviewed.    Specialty Physician Assistant  Urology  Theda BelfastAnastasia Azalynn Maxim, PA-C

## 2022-05-06 NOTE — Progress Notes (Signed)
MEDICINE PROGRESS NOTE  [facility], DIVISION OF HOSPITALIST MEDICINE   [facility]   [place] Pager: [phone]      Date Time: 05/06/22 12:15 PM  Patient Name: [name] [name],[name] [name]  Attending Physician: Laretta Bolster, MD  Hospital Day: 6  Assessment:     34 y old male who is active smoker with severe emphysema and presented on 6/3 with SOB and was found to have O2 sat in 86% by EMS and tachycardic and tachypneic and in ED was placed on BiPAP and CXR showed right small to moderate PTX and CT chest showed moderate PTX, s/p right Chest tube placement on 6/3 and was down graded to medical floor on 6/4.    Plan:     Acute COPD exacerbation/severe emphysema  Acute hypoxemic resp failure  Right sided pneumothorax s/p chest tube placement on 6/3  -c/w Duoneb,and decadron 8 mg IV q12h for today and will change to prednisone 60 mg from am  -stop Pulmicort nebulizer and start on Breo per pulm  -c/w azithromycin,change to po in am for another 2 days (has received 3 days already)  -s/pchest tube,CXR shows resolution of PTX and no output, discussed with pulmonologist by previous attending  -Is use was encouraged  -currently on 3 lits O2 via NC and O2 sat 99%,need to check O2 sat at -was able to wean off O2 today during rounds, but CXR with apical small PTX, would keep 2-3 L O2 vi NC  -recheck CXR in am  -smoking cessation was encouraged  -Chest tube was removed yesterday, chest x-ray-2 views repeated today, shows persistent apical pneumothorax.  --Appreciate input from pulmonary-CT scan of the chest ordered.    -c/w nicotine patch   -PT/OT    Urinary retention   --patient with new urinary retention today x 2 straight cath.  -- Continue Flomax  --Urology consult appreciated  -- Patient to follow-up as outpatient with urology.      Sinus tachycardia   -Patient noted to have heart rate from 130s-highest to low 100s.  -- Deconditioning also playing a role      Leucocytosis  -WBC 9.7-->17.47-->19, down to 17.8 likely due  to steroids      Hyperglycemia due to steroids- resolved  -A1C 5.5%  -c/w SSI for coverage      Cachexia/underweight/sever malnutrition  -consulted nutritionist      Palliative care consult since he has bad emphysema and there is no advance directive      PT/OT ordered       DVT [id number] sc    I spent 35 minutes on the unit in direct care for this patient  > 50% in counseling and coordination of care for conditions described in my assessment and plan.       Case was discussed with pt and RN  Discussed patient's dtr at bedside.      Lines:     Patient Lines/Drains/Airways Status       Active PICC Line / CVC Line / PIV Line / Drain / Airway / Intraosseous Line / Epidural Line / ART Line / Line / Wound / Pressure Ulcer / NG/OG Tube       Name Placement date Placement time Site Days    Peripheral IV 05/01/22 18 G Right Antecubital 05/01/22  0514  Antecubital  1    Peripheral IV 05/01/22 20 G Left Antecubital 05/01/22  0630  Antecubital  1  Subjective/ROS/24 hr events:     CC:SOB,pneumothorax     I have seen and examined the patient.   Patient appears comfortable.  Denies any shortness of breath.  Patient's daughter is at bedside.  No chest pain.        Physical Exam:     Temp:  [97.3 F (36.3 C)-98.2 F (36.8 C)] 97.5 F (36.4 C)  Heart Rate:  [87-134] 103  Resp Rate:  [14-18] 14  BP: (112-164)/(70-98) 115/70  Intake and Output Summary-last 24 Hrs:  I/O last 3 completed shifts:  In: 1110 [P.O.:1110]  Out: 2875 [Urine:2875]    O2 Flow Rate (L/min)  Avg: 2.7 L/min  Min: 2 L/min  Max: 4 L/min by O2 Device: Nasal cannula     General: AAOX3,on 3 lit O2 via NC,cachectic, in no acute distress,  Cardiovascular: tachycardia  Lungs: globally reduced air entry b/l lung but better today than yesterday,+chest tube right lateral chest  Abdomen: soft, non-tender, non-distended; normoactive bowel sounds  Extremities: no edema  Neurological: Alert and oriented X 3, moves all extremities.   Skin:no rash or  lesion  Foley: none  Central line:none       Meds:   Medications were reviewed:  Scheduled Meds:  Current Facility-Administered Medications   Medication Dose Route Frequency    albuterol-ipratropium  3 mL Nebulization Q4H SCH    fluticasone furoate-vilanterol  1 puff Inhalation QAM    heparin (porcine)  5,000 Units Subcutaneous Q8H SCH    insulin lispro  1-3 Units Subcutaneous QHS    insulin lispro  1-5 Units Subcutaneous TID AC    nicotine  1 patch Transdermal Daily    predniSONE  60 mg Oral QAM W/BREAKFAST    senna-docusate  1 tablet Oral Q12H SCH    tamsulosin  0.4 mg Oral Daily after dinner     Continuous Infusions:  PRN Meds:.acetaminophen  Labs/Radiology:   Imaging personally reviewed, including: all available   XR Chest AP Portable    Result Date: 05/05/2022   Small right apical pneumothorax status post chest tube removal Laurena SlimmerNitin Kumar, MD 05/05/2022 1:42 PM     Recent Labs     05/06/22  1103 05/06/22  0625 05/05/22  2020 05/05/22  1556   Whole Blood Glucose POCT 92 83 144* 146*       Recent Labs   Lab 05/06/22  0600 05/05/22  0543 05/04/22  0608 05/03/22  0611   Sodium 144 142 142 144   Potassium 4.3 4.6 4.9 4.5   Chloride 101 102 105 109   BUN 28.0 31.0* 29.0* 22.0   Creatinine 0.7 0.7 0.7 0.7   eGFR >60.0 >60.0 >60.0 >60.0   Glucose 81 77 122* 116*   Calcium 8.5 9.2 8.9 8.8       Recent Labs   Lab 05/04/22  0608 05/03/22  0611 05/02/22  1040 05/01/22  0516   WBC 17.88* 19.44* 17.47* 9.70*   Hgb 11.3* 10.9* 11.2* 13.5   Hematocrit 37.1* 35.5* 36.6* 44.5   Platelets 260 259 244 330           Recent Labs   Lab 05/01/22  0516   Alkaline Phosphatase 86   Bilirubin, Total 0.3   ALT 17   AST (SGOT) 30       Recent Labs   Lab 05/01/22  0516   Procalcitonin 0.03           Signed by: Laretta BolsterKanchan Cherylene Ferrufino, MD

## 2022-05-06 NOTE — Plan of Care (Signed)
Pt is AOx4 ; no signs of any distress; satting 98-99% on 2LNC; no SOB noted; no complaint of any chest discomfort; v/s stable; SBP=110's; afebrile; SR-ST on the monitor; HR usually 90's to low 100's;  HR can fluctuate at times to 110's; pt denies any palpitation or any pain; getting inhaler, prednisone and duoneb treatments as scheduled; pt encouraged to use IS more often; no complaint made this shift; will continue to monitor v/s, telemetry and any changes in respiratory status.    Problem: Inadequate Gas Exchange  Goal: Adequate oxygenation and improved ventilation  Outcome: Progressing  Flowsheets (Taken 05/06/2022 0411)  Adequate oxygenation and improved ventilation:   Assess lung sounds   Monitor SpO2 and treat as needed   Provide mechanical and oxygen support to facilitate gas exchange   Position for maximum ventilatory efficiency   Teach/reinforce use of incentive spirometer 10 times per hour while awake, cough and deep breath as needed   Plan activities to conserve energy: plan rest periods   Increase activity as tolerated/progressive mobility   Consult/collaborate with Respiratory Therapy     Problem: Hemodynamic Status: Cardiac  Goal: Stable vital signs and fluid balance  Outcome: Progressing  Flowsheets (Taken 05/06/2022 0411)  Stable vital signs and fluid balance:   Monitor/assess vital signs and telemetry per unit protocol   Weigh on admission and record weight daily   Assess signs and symptoms associated with cardiac rhythm changes   Monitor intake/output per unit protocol and/or LIP order   Monitor lab values   Monitor for leg swelling/edema and report to LIP if abnormal     Problem: Bladder/Voiding  Goal: Free from infection  Outcome: Progressing  Flowsheets (Taken 05/06/2022 0411)  Free from infection:   Monitor/assess for signs and symptoms of infection   Assess need for indwelling catheter every shift and discuss with LIP     Problem: Safety  Goal: Patient will be free from injury during  hospitalization  Outcome: Progressing  Flowsheets (Taken 05/06/2022 0411)  Patient will be free from injury during hospitalization:   Assess patient's risk for falls and implement fall prevention plan of care per policy   Provide and maintain safe environment   Ensure appropriate safety devices are available at the bedside   Include patient/ family/ care giver in decisions related to safety   Hourly rounding

## 2022-05-07 ENCOUNTER — Encounter (INDEPENDENT_AMBULATORY_CARE_PROVIDER_SITE_OTHER): Payer: Self-pay

## 2022-05-07 LAB — BASIC METABOLIC PANEL
Anion Gap: 7 (ref 5.0–15.0)
BUN: 36 mg/dL — ABNORMAL HIGH (ref 9.0–28.0)
CO2: 34 mEq/L — ABNORMAL HIGH (ref 17–29)
Calcium: 9.1 mg/dL (ref 7.9–10.2)
Chloride: 103 mEq/L (ref 99–111)
Creatinine: 0.8 mg/dL (ref 0.5–1.5)
Glucose: 95 mg/dL (ref 70–100)
Potassium: 4.6 mEq/L (ref 3.5–5.3)
Sodium: 144 mEq/L (ref 135–145)
eGFR: >60 mL/min/{1.73_m2} (ref >=60)

## 2022-05-07 LAB — GLUCOSE WHOLE BLOOD - POCT
Whole Blood Glucose POCT: 99 mg/dL (ref 70–100)
Whole Blood Glucose POCT: 124 mg/dL — ABNORMAL HIGH (ref 70–100)
Whole Blood Glucose POCT: 219 mg/dL — ABNORMAL HIGH (ref 70–100)

## 2022-05-07 MED ORDER — FLUTICASONE FUROATE-VILANTEROL 100-25 MCG/ACT IN AEPB
1.0000 | INHALATION_SPRAY | Freq: Every day | RESPIRATORY_TRACT | 0 refills | Status: DC
Start: 2022-05-07 — End: 2022-09-24

## 2022-05-07 MED ORDER — PREDNISONE 20 MG PO TABS
40.0000 mg | ORAL_TABLET | Freq: Every morning | ORAL | 0 refills | Status: DC
Start: 2022-05-08 — End: 2022-05-07

## 2022-05-07 MED ORDER — FLUTICASONE FUROATE-VILANTEROL 100-25 MCG/ACT IN AEPB
1.0000 | INHALATION_SPRAY | Freq: Two times a day (BID) | RESPIRATORY_TRACT | 0 refills | Status: DC
Start: 2022-05-07 — End: 2022-05-07

## 2022-05-07 MED ORDER — PREDNISONE 20 MG PO TABS
40.0000 mg | ORAL_TABLET | Freq: Every morning | ORAL | 0 refills | Status: DC
Start: 2022-05-08 — End: 2022-09-24

## 2022-05-07 MED ORDER — NICOTINE 21 MG/24HR TD PT24
1.0000 | MEDICATED_PATCH | Freq: Every day | TRANSDERMAL | 1 refills | Status: DC
Start: 2022-05-08 — End: 2022-05-07

## 2022-05-07 MED ORDER — TAMSULOSIN HCL 0.4 MG PO CAPS
0.4000 mg | ORAL_CAPSULE | Freq: Every day | ORAL | 0 refills | Status: DC
Start: 2022-05-08 — End: 2022-05-07

## 2022-05-07 MED ORDER — SENNOSIDES-DOCUSATE SODIUM 8.6-50 MG PO TABS
1.0000 | ORAL_TABLET | Freq: Two times a day (BID) | ORAL | 0 refills | Status: DC
Start: 2022-05-07 — End: 2022-05-07

## 2022-05-07 MED ORDER — SENNOSIDES-DOCUSATE SODIUM 8.6-50 MG PO TABS
1.0000 | ORAL_TABLET | Freq: Two times a day (BID) | ORAL | 0 refills | Status: DC
Start: 2022-05-07 — End: 2022-09-24

## 2022-05-07 MED ORDER — NICOTINE 21 MG/24HR TD PT24
1.0000 | MEDICATED_PATCH | Freq: Every day | TRANSDERMAL | 1 refills | Status: DC
Start: 2022-05-08 — End: 2022-09-24

## 2022-05-07 MED ORDER — TAMSULOSIN HCL 0.4 MG PO CAPS
0.4000 mg | ORAL_CAPSULE | Freq: Every day | ORAL | 0 refills | Status: DC
Start: 2022-05-08 — End: 2022-09-07

## 2022-05-07 NOTE — Progress Notes (Signed)
[facility] ([facility]):     Received a referral to schedule a follow up appointment with the [facility].  Appointment scheduled for Tuesday 05/11/22 at 11:20 am with NP Y[place] at [place] location.      Clinic address is as follows:    [address] [place], [address]    Please notify patient to arrive 15 minutes early to the appointment and bring the following materials with them:    Insurance card (if insured) and photo ID  Medications in their original bottles  Glucometer/blood sugar log (if diabetic)  Weight log (if heart failure)  Proof of income (to enroll in medication assistance programs-first two pages of signed 1040 tax forms or last 2 months of pay stubs)      Claudia Prado Herrera  Patient Access Associate II  [facility]   [phone]

## 2022-05-07 NOTE — Progress Notes (Signed)
PULMONARY INPATIENT PROGRESS NOTE  [phone] (MD Spectralink #1) / [phone] (MD Spectralink #2) / [phone] (PA Spectralink)        Date Time: 05/07/22 11:08 AM  Patient Name: [name] [name],[name] [name]      Assessment:       Patient Active Problem List   Diagnosis    Bladder stone    Urinary retention    Benign prostatic hyperplasia with lower urinary tract symptoms    Right kidney stone    Acute on chronic respiratory failure, unspecified whether with hypoxia or hypercapnia     This is a 71 yo male with     COPD with exacerbation  R PTX  S/p chest tube  No PTX on CXR 6/5  Chest tube removed 6/7 with post removal PTX - stable on 6/8 CXR  CT chest 6/8 with no PTX  Active tobacco use         Plan:   Continue Breo  Continue prednisone taper  Keep on O2  Encourage IS use, OOB, ambulation  OK for d/c from pulmonary standpoint -  see [place] in clinic in 2-3 weeks             Interval History     Interval History/24 hr. Events:  Chart reviewed.  CT chest with no PTX.    Review of Systems:   Reviewed and no changes      Physical Exam:     VITAL SIGNS PHYSICAL EXAM   Vitals:    05/07/22 0840   BP:    Pulse:    Resp:    Temp:    SpO2: 98%     Temp (24hrs), Avg:97.9 F (36.6 C), Min:97.7 F (36.5 C), Max:97.9 F (36.6 C)      Intake and Output Summary (Last 24 hours) at Date Time    Intake/Output Summary (Last 24 hours) at 05/07/2022 1108  Last data filed at 05/06/2022 1700  Gross per 24 hour   Intake 200 ml   Output 700 ml   Net -500 ml               SpO2: 98 % (05/07/2022  8:40 AM)  O2 Device: Nasal cannula (05/07/2022  8:40 AM)  FiO2: 32 % (05/07/2022  8:40 AM)  O2 Flow Rate (L/min): 3 L/min (05/07/2022  8:40 AM)     Physical Exam         Meds:     Current Facility-Administered Medications   Medication Dose Route Frequency    albuterol-ipratropium  3 mL Nebulization Q4H SCH    fluticasone furoate-vilanterol  1 puff Inhalation QAM    heparin (porcine)  5,000 Units Subcutaneous Q8H SCH    insulin lispro  1-3 Units Subcutaneous QHS    insulin lispro  1-5 Units  Subcutaneous TID AC    nicotine  1 patch Transdermal Daily    predniSONE  60 mg Oral QAM W/BREAKFAST    senna-docusate  1 tablet Oral Q12H [facility]    tamsulosin  0.4 mg Oral Daily after dinner       Labs:     Recent Labs   Lab 05/07/22  0504 05/06/22  0600 05/05/22  0543 05/04/22  0608 05/03/22  0611 05/02/22  1040 05/01/22  0516   WBC  --   --   --  17.88* 19.44* 17.47* 9.70*   RBC  --   --   --  4.04* 3.88* 3.97* 4.82   Hgb  --   --   --  11.3* 10.9* 11.2* 13.5   Hematocrit  --   --   --  37.1* 35.5* 36.6* 44.5   Glucose 95 81 77 122* 116* 133* 232*   BUN 36.0* 28.0 31.0* 29.0* 22.0 21.0 16.0   Creatinine 0.8 0.7 0.7 0.7 0.7 0.7 0.9   Calcium 9.1 8.5 9.2 8.9 8.8 8.9 9.1   Sodium 144 144 142 142 144 142 142   Potassium 4.6 4.3 4.6 4.9 4.5 4.3 4.2   Chloride 103 101 102 105 109 109 106   CO2 34* 33* 34* 31* 30* 25 28             Imaging:         Outside imaging and imaging available in Epic were directly reviewed     Radiology Results (24 Hour)       Procedure Component Value Units Date/Time    CT Chest WO Contrast [[id number]] Collected: 05/07/22 0609    Order Status: Completed Updated: 05/07/22 0614    Narrative:      History: Pneumothorax ACUTE RESP ILLNESS, IMMUNOCOMP,   Note: Note that CT scanning at this site  utilizes multiple dose reduction techniques including automatic exposure control, adjustment of the MAS and/or KVP according to patient's size and use of iterative reconstruction technique  Findings.    A noncontrast CT is now compared to 05/01/2022. Previously seen anterior pneumothorax is not well seen. There is extensive bilateral bullous change and multifocal scarring. The heart and mediastinum are stable. There is thoracic aortic and coronary   artery calcification. There is a small hiatal hernia. There are degenerative changes spine. 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    The small suspected pneumothorax seen on chest x-ray from  earlier on June 8 is not persistent on CT       Impression:       Resolution of previously seen pneumothorax seen on the computed tomography scan of 05/01/2022. Severe bilateral bullous changes    Laurena Slimmer, MD  05/07/2022 6:12 AM    XR Chest 2 Views [[id number]] Collected: 05/06/22 1409    Order Status: Completed Updated: 05/06/22 1415    Narrative:      History: f/u Pneumothorax with comparison to June 7      PA and lateral views demonstrates stable posttreatment changes bilaterally. There is a persistant small residual right apical pneumothorax seen best on the lateral.    There is stable blunting of the costophrenic angles      Impression:       Persistent small right apical pneumothorax now best seen on the lateral film. This has not worsened since prior    Laurena Slimmer, MD  05/06/2022 2:12 PM              Signed by: Loman Chroman, MD    [facility]  Spectra [phone]

## 2022-05-07 NOTE — Plan of Care (Signed)
Problem: Inadequate Gas Exchange  Goal: Adequate oxygenation and improved ventilation  Flowsheets (Taken 05/07/2022 0536)  Adequate oxygenation and improved ventilation:   Assess lung sounds   Provide mechanical and oxygen support to facilitate gas exchange   Monitor SpO2 and treat as needed   Position for maximum ventilatory efficiency   Teach/reinforce use of incentive spirometer 10 times per hour while awake, cough and deep breath as needed  Goal: Patent Airway maintained  Flowsheets (Taken 05/07/2022 0536)  Patent airway maintained:   Position patient for maximum ventilatory efficiency   Suction secretions as needed   Provide adequate fluid intake to liquefy secretions     Problem: Compromised Hemodynamic Status  Goal: Vital signs and fluid balance maintained/improved  Flowsheets (Taken 05/07/2022 0536)  Vital signs and fluid balance are maintained/improved:   Position patient for maximum circulation/cardiac output   Monitor/assess vitals and hemodynamic parameters with position changes   Monitor intake and output. Notify LIP if urine output is less than 30 mL/hour.   Monitor and compare daily weight     Problem: Safety  Goal: Patient will be free from injury during hospitalization  Flowsheets (Taken 05/07/2022 0536)  Patient will be free from injury during hospitalization:   Assess patient's risk for falls and implement fall prevention plan of care per policy   Provide and maintain safe environment   Use appropriate transfer methods   Ensure appropriate safety devices are available at the bedside

## 2022-05-07 NOTE — Progress Notes (Signed)
Discharge order accepted. Medications and discharge instructions were reviewed with patient and son and son verbalized understanding of the discharge instructions. PIV removed and patient tolerated it well. Discharge papers and personal belongings given to family. Patient left unit alert and oriented accompanied by hospital staff to the blue lobby.

## 2022-05-07 NOTE — PT Progress Note (Signed)
Physical Therapy Note  Physical Therapy Treatment  [name] [name]  Post Acute Care Therapy Recommendations:     Discharge Recommendations:  Home with supervision, Home with home health PT    If Home with supervision, Home with home health PT  recommended discharge disposition is not available, patient will need supv assist for ambulation and out pt PT.     DME needs IF patient is discharging home: Grab bars, Shower chair, Four wheel walker    Therapy discharge recommendations may change with patient status.  Please refer to most recent note for up-to-date recommendations.         [facility]  [address]  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 Just had foley removed.    Interpreter utilized: no, not indicated, as pt answered all questions and followed all commands in [place]; daughter present to reinforce in [place] as needed.    Assessment   Pt performed transfers bed to chair with supv, performed seated LE exs and PLB demo with cues. Pt also instructed in assessment of NC flow to be sure O2 is on. Pt needed distant supv for ambul 180' with RW initially, and progressing to [employer] for demo purposes. Pt on 2L/min throughout session with SpO2 96-91%, HR 121-127. Pt and daughter educated on utility of Pulse ox and Rollator to promote mobility while gauging resp/HR status.    PMP - Progressive Mobility Protocol   PMP Activity:  Step 7 - Walks out of Room  Distance Walked (ft) (Step 6,7): 180 Feet     Plan     Continue plan of care.    Interdisciplinary Communication: Nursing made aware of session outcome. CM aware of rec for Rollator, HHPT    Subjective     Patient is agreeable to participation in the therapy session. Family and/or guardian are agreeable to patient's participation in the therapy session. Nursing clears patient for therapy.  Pain: pt denies pain    Vitals: SpO2 96-91%, HR 121-127  Vitals:    05/06/22 2347 05/07/22 0730 05/07/22 0840 05/07/22 1114   BP: 119/78 112/74  113/73   Pulse: (!) 108 98  (!) 121   Resp:  14  14   Temp: 97.7 F (36.5 C) 97.9 F (36.6 C)  97.2 F (36.2 C)   TempSrc: Oral Oral  Oral   SpO2: 99% 100% 98% 97%   Weight:       Height:           Objective     Precautions/ Contraindications:   Precautions  Weight Bearing Status: no restrictions  Other Precautions: Drop in O2, tachy cardia, fall, new urinary retention    Patient is in bed with telemetry, intravenous access, and O2 at 2 liters/minute via NC  in place. Dtr at bedside.  Therapeutic exercises:  Pt performed seated LE exercises 10 x each:  Alternating hip flexion  Hip abduction/adduction with BUE resist  Alternating Knee ext (LAQ)  Ankle DF/PF  Ankle Inversion/Eversion  Pt able to demo appropriate PLB and O2 NC flow technique following instruction    Functional Mobility:  Rolling: indep  Supine to Sit: mod I  Scooting: mod I  Sit to Supine: NT  Sit to Stand: mod I  Stand to Sit: mod I  Transfer Training: on/off EOB, chair, W/C and Rollator    Gait:   WB status: FWB  Assistive Device: RW, progressed to Rollator  Assist Level: supv  Distance: 180' x2  Pattern: WNL   Stairs/Curbs: deferred, as pt and daughter clarified he does not need to negotiate steps at home; no STE and able to stay on main floor    Balance Training:  good for all activity    Educated the patient to role of physical therapy, plan of care, goals  of therapy, rationale for  progressing mobility and HEP, safety with mobility and ADLs, energy conservation techniques, pursed lip breathing, O2 tank mgt and home safety.    RN notified of session outcome and that patient was left in chair with all needs met and equipment intact.   Safety measures include: handoff to nurse/clin tech/ unit secretary completed, oriented to call bell and placed within reach, personal items within reach, assistive device positioned out of reach, bed placed in lowest position, and family/caregiver at bedside.   Mobility and ADL status posted at bedside and within E.M.R.    Goals per Eval/ Re-eval:   Goals  Goal Formulation: With patient/family  Time for Goal Acheivement: 5 visits  Goals: Select goal  Pt Will Go Supine To Sit: modified independent  Pt Will Perform Sit To Supine: modified independent  Pt Will Perform Sit to Stand: modified independent  Pt Will Transfer Bed/Chair: modified independent  Pt Will Ambulate: > 200 feet, with rolling walker, modified independent  Pt Will Go Up / Down Stairs: 1 flight, with supervision, Discontinued (comment)        Therapist PPE during session gloves     Signature:  Chester Holstein, PT  05/07/2022 2:24 PM   Phone: [phone]     (For scheduling questions, please contact rehab tech [phone])

## 2022-05-07 NOTE — Plan of Care (Addendum)
[facility] Palliative Medicine & Comprehensive Care Follow Up   Service Spectralink: [phone]  Mon-Fri 9a-4p  Xtend Pager: #[phone]    24/7      Palliative Care Brief Note   Date Time: 05/07/22 1:44 PM   Patient Name: [name] [name],[name] [name]   Location: MI627/MI627-01      Goals of care Clarified with the patient and son .   Advance directive:completed and copy in  EMR  Code Status:DNR -signed copy in EMR  What matters most to the patient: continue with all recommended treatment plan.   Decides to quit smoking. No need for outpatient palliative care services    Palliative care will sign off.    Thank you for allowing [place] to participate in the care of this patient.   If palliative care services are needed. Please do not hesitate to contact team.    15 minutes. Total time on unit today in care of [facility] including chart review, face to face evaluation, management, counseling and coordination of care.   Discussed case/recommendations with: bedside RN      Khoi Hamberger Chancy Milroy, FNP-C  Palliative Medicine & Comprehensive Care  Phone Number: MV: [phone], Mon-Fri 9a-4p

## 2022-05-07 NOTE — Discharge Instr - AVS First Page (Addendum)
Reason for your Hospital Admission:        Instructions for after your discharge:  COPD exacerbation  Pneumothorax    If you have worsening shortness of breath, please call MD or go to ED. If you are unable to empty your bladder or have retention of urine, please call MD or go to ED.       Home Health Discharge Information    Your doctor has ordered Skilled Nursing, Physical Therapy, and Occupational Therapy in-home service(s) for you while you recuperate at home, to assist you in the transition from hospital to home.    The agency that you or your representative chose to provide the service:  Name of Home Health Agency Placement: [facility]] [phone]        The above services were set up by:  Hoover Brunette, RN [facility] Health Liaison) Phone  [phone]      IF YOU HAVE NOT HEARD FROM YOUR HOME YOUR HOME HEALTH AGENCY WITHIN 24-48 HOURS AFTER DISCHARGE PLEASE CALL YOUR AGENCY TO ARRANGE A TIME FOR YOUR FIRST VISIT. FOR ANY SCHEDULING CONCERNS OR QUESTIONS RELATED TO HOME HEALTH, SUCH AS TIME OR DATE PLEASE CONTACT YOUR HOME HEALTH AGENCY AT THE NUMBER LISTED ABOVE.

## 2022-05-07 NOTE — Progress Notes (Signed)
Start [facility] Note  Home Health Referral    Referral from [name] Close, RN (Case Manager) for home health care upon discharge.    By [employer], the patient has the right to freely choose a home care provider.    A company of the patients choosing. We have supplied the patient with a listing of providers in your area who asked to be included and participate in Medicare.   [facility], formerly [facility], a home care agency that provides adult home care services and participates in Medicare   The preferred provider of your insurance company. Choosing a home care provider other than your insurance company's preferred provider may affect your insurance coverage.      Home Health Discharge Information    Your doctor has ordered Skilled Nursing, Physical Therapy, and Occupational Therapy in-home service(s) for you while you recuperate at home, to assist you in the transition from hospital to home.    The agency that you or your representative chose to provide the service:  Name of Home Health Agency Placement: [facility]] [phone]        The above services were set up by:  [name] Brunette, RN [facility] Health Liaison) Phone  [phone]      IF YOU HAVE NOT HEARD FROM YOUR HOME YOUR HOME HEALTH AGENCY WITHIN 24-48 HOURS AFTER DISCHARGE PLEASE CALL YOUR AGENCY TO ARRANGE A TIME FOR YOUR FIRST VISIT. FOR ANY SCHEDULING CONCERNS OR QUESTIONS RELATED TO HOME HEALTH, SUCH AS TIME OR DATE PLEASE CONTACT YOUR HOME HEALTH AGENCY AT THE NUMBER LISTED ABOVE.    Additional comments:  [name] [name] is a 71 y.o. [name] with hx of active smoking, severe pulmonary emphysema who presented with acute onset of dyspnea overnight.  Per medic report patient's POX was 86%, tachypneic, tachycardic 130-140s.  No fevers.  Per son, he was at his baseline yesterday.      In ED, he was placed on 5 LPM via NC, subsequently switched to BIPAP 10/5, 40%.  CXR with evidence of R small to moderate PTX.  CT chest with  moderate PTX, severe pulmonary bullous emphysema.   Negative troponin, negative lactate. Got nebs and iv steroids.      Does not take any medications at baseline.  Continues to smoke.        START PATIENT REGISTRATION INFORMATION     Order Information  Order Signing Physician: [name] Bolster, MD     Service Ordered RN ?: Yes  Service Ordered PT ?: Yes  Service Ordered OT ?: Yes  Service Ordered ST ?: No    Service Ordered MSW?: No    Service Ordered HHA?: Yes  Following Physician:   Dr. Sherene [name] [facility]  Phone: [phone] Fax: [phone] and obtain further orders   Date/Time of Call: 05/07/22 3:51 PM      Care Coordination   Same Day [facility]?: no  Primary Care Physician:PCP None, MD  Primary Care Physician Phone:None  Primary Care Physician Address: None  PCP NPI:   Visit Instructions: N/A  Service Discharge Location Type: Home  Service Facility Name: N/A  Service Floor Facility: N/A  Service Room No: N/A    Demographics  Patient Last Name: Bagby   Patient First Name: [name] [name]  Language/Communication Barrier: non-english speaking   Service Address: [address] [place] [address]   Service Home Phone: [phone] (home)   Other phone numbers:  Telephone Information:   Mobile [phone]     Emergency Contact: Extended Emergency Contact Information  Primary Emergency Contact: [name],[name]  Mobile Phone: [phone]  Relation: Son  Interpreter needed? 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]  Acute on chronic respiratory failure, unspecified whether with hypoxia or hypercapnia [J96.20]  Pneumothorax, unspecified type [J93.9]     Caregiver Information  Caregiver First Name: N/A  Caregiver Last Name: N/A  Caregiver Relationship to Patient: Child  Caregiver Phone Number: N/A  Caregiver Notes: N/A            Data processing manager  Information  Primary Subscriber:   Primary Subscriber Relation To Guarantor:   Primary Payor:   Primary Plan:   Primary Group #:    Primary Subscriber ID:    Primary Subscriber DOB:   Secondary Insurance Information  Secondary Subscriber:   Secondary Subscriber Relation To Guarantor:   Secondary Payor:   Secondary Plan:   Secondary Group #:   Secondary Subscriber ID:   Secondary Subscriber DOB:   [name] [name]  NO    END PATIENT REGISTRATION INFORMATION       Diagnosis:   Acute on chronic respiratory failure, unspecified whether with hypoxia or hypercapnia J96.20   Bladder stone N21.0   Urinary retention R33.9   Benign prostatic hyperplasia with lower urinary tract symptoms N40.1   Right kidney stone N20.0    Start [facility] Summary        Additional Comments:        Home Health face-to-face (FTF) Encounter (Order [id number])  Consult  Date: 05/07/2022 Department: [facility] Intermediate Care Ordering/Authorizing: [name] Bolster, MD     Order Information    Order Date/Time Release Date/Time Start Date/Time End Date/Time   05/07/22 03:51 PM None 05/07/22 03:50 PM 05/07/22 03:50 PM     Order Details    Frequency Duration Priority Order Class   Once 1  occurrence Routine Hospital Performed     Standing Order Information    Remaining Occurrences Interval Last Released     0/1 Once 05/07/2022              Provider Information    Ordering User Ordering Provider Authorizing Provider   [name] Brunette, RN [name] Bolster, MD [name] Bolster, MD   Attending Provider(s) Admitting Provider PCP   Norberto Sorenson, DO; Luz Brazen, MD; Richarda Osmond, MD; Derrick Ravel, MD; [name] Bolster, MD Luz Brazen, MD Pcp, None, MD     Verbal Order Info    Action Created on Order Mode Entered by Responsible Provider Signed by Signed on   Ordering 05/07/22 1551 Telephone with readback [name] Brunette, RN [name] Bolster, MD             Comments    Home nursing required for skilled assessment including cardiopulmonary assessment and  dietary education for disease management, and medication instruction. Home PT/OT required for gait and balance training, strengthening, mobility, fall prevention, and ADL training.     Acute on chronic respiratory failure, unspecified whether with hypoxia or hypercapnia J96.20   Bladder stone N21.0   Urinary retention R33.9   Benign prostatic hyperplasia with lower urinary tract symptoms N40.1   Right kidney stone N20.0                Home Health face-to-face (FTF) Encounter: Patient Communication  Not Released  Not seen         Order Questions    Question Answer   Date I saw the patient face-to-face: 05/07/2022   Evidence this patient is homebound because: B.  Profound weakness, poor balance/unsteady gait d/t illness/treatment/procedure    C.  Decreased endurance, strength, ROM, cadence, safety/judgment during mobility    F.  Deconditioned due to advance disease process requiring assistance to leave home    J.  Advanced age with frailty factors affecting safe ambulation & needs supervision    L.  Transfer/ambulation requires stand by assist and verbal cues to perform safely    M.  Unsteady gait, poor ambulation with history of falls    I.  Restricted to home to decrease risk of infection   Medical conditions that necessitate Home Health care: B.  Functional impairment due to recent hospitalization/procedure/treatment    C.  Risk for complication/infection/pain requiring follow up and monitoring    D.  Chronic illness & risk for re-hospitalization due to unstable disease status    I.  Tube/drainage/ostomy requiring ongoing care and teaching   Per clinical findings, following services are medically necessary: Skilled Nursing    PT    OT   Clinical findings that support the need for Skilled Nursing. SN will: C. Monitor for signs and symptoms of exacerbation of disease and management    D. Review medication reconciliation, manage and educate on use and side effects    G. Educate on new diagnosis, treatment &  management to prevent re-hospitalization    H. Assess cardiopulmonary status and monitor for signs &symptoms of exacerbation    I.  Educate dietary and or fluid restrictions and weight management    J.  Instruct on ongoing care and maintenance for tube/drain/ostomy   Clinical findings that support the need for Physical Therapy. PT will A.  Evaluate and treat functional impairment and improve mobility    C.  Educate on weight bearing status, stair/gait training, balance & coordination    D.  Provide services to help restore function, mobility, and releive pain    E.  Educate on functional mobility; bed, chair, sit, stand and transfer activities    F.  Perform home safety assessment & develop safe in home exercise program    G.  Implement activities to improve stance time, cadence & step length    H.  Educate on the safe use of assistive device/ durable medical equipment    I.  Instruct on restorative activities to restore ability to perform ADL   OT will provide assistance with: Home program to improve ability to perform ADLs    Recovery and maintenance skills    Basic motor function and reasoning abilities    Restorative program to improve mobility and independence    Strategies to compensate for loss of function   Other (please specify) See comment section for orders/Foley catheter care                    Process Instructions    Please select Home Care Services medically necessary.     Based on the above findings, I certify that this patient is confined to the home and needs intermittent skilled nursing care, physical therapry and / or speech therapy or continues to need occupational therapy. The patient is under my care, and I have initiated the establishment of the plan of care. This patient will be followed by a physician who will periodically review the plan  of care.      Collection Information            Consult Order Info    ID Description Priority Start Date Start Time   [id number] Home Health face-to-face  (FTF) Encounter Routine 05/07/2022  3:50 PM   Provider Specialty Referred to   ______________________________________ _____________________________________                         Verbal Order Info    Action Created on Order Mode Entered by Responsible Provider Signed by Signed on   Ordering 05/07/22 1551 Telephone with Cornelious Bryant, RN [name] Bolster, MD             Patient Information    Patient Name   [name] [name], [name] [name] Legal Sex   [name] DOB   1951/11/22       Reprint Order Requisition    Home Health face-to-face (FTF) Encounter (Order #[id number]) on 05/07/22       Additional Information    Associated Reports External References   Priority and Order Details [facility]  [facility]     Patient Name: [name] [name], [name] [name]     MRN: [id number]      CSN: [id number]         Account Information     Hosp Acct #   [id number] Patient Class   Inpatient Service  Medicine Accommodation Code  Intermediate Care      Admission Information     Admitting Physician:  Attending Physician: Luz Brazen, MD  [name] Bolster, MD Unit  [facility] INTERMED* L&D Status      Admitting Diagnosis: Emphysema with chronic bronchitis; Acute on chronic respiratory failure, u* Room / Bed  MI627/MI627-01 L&D - Last Menstrual Cycle      Chief Complaint: Respiratory Distress     Admit Type:  Admit Date/Time:  Discharge Date/Time: Emergency  05/01/2022 / 2952   /  Length of Stay: 6 Days    L&D EDD   Estimated Date of Delivery: None noted.      Patient Information              Home Address: [address] [place] [address] Employer:  Employer Address:       ,     Main Phone: [phone] Employer Phone:     SSN: [id number]       DOB: 1951/03/11 (70 yrs)       Sex: [name] Primary Care Physician: Pcp, None, MD   Marital Status: Unknown Referring Physician:       No ref. provider found   Race: Unavailable       Ethnicity: Unavailable       Emergency Contacts  Name Home Phone Work Phone Mobile Phone Relationship [name] [name], [name] [name]     [phone] Son No         Guarantor Information     Guarantor Name: [name] [name], [name] [name] ID: [id number]   Guarantor Relationship to Pt: Self Guarantor Type: Personal/Family   Guarantor DOB:    10-29-1951       Guarantor Address: [address]   [place], [place] [address]          Guarantor Home Phone: [phone] Guarantor Employer:        Guarantor Work Phone:   Pensions consultant Emp Phone:  Primary Insurance     Insurance Name: MEDICARE-MEDICARE PART A AND B Subscriber Name: [name] [name]   Insurance Address:    [address]  [place], [place] [address] Subscriber DOB: [birth year]      Subscriber ID: [id number]   Insurance Phone:   Pt Relationship to Sub:   Self   Insurance ID:         Group Name:   Preauthorization #: NPR   Group #:   Preauthorization Days:        Tourist information centre manager Name: - Statistician Name:     Nurse, children's:       ,   Statistician DOB:        Subscriber ID:     Press photographer:   Pt Relationship to Sub:       Insurance ID:         Group Name:   Preauthorization #:     Group #:   Preauthorization Days:        [employer] Name: - Subscriber Name:     Community education officer Address:       ,   Statistician DOB:        Subscriber ID:     Press photographer:   Pt Relationship to Sub:       Insurance ID:         Group Name:   Preauthorization #:     Group #:   Preauthorization Days:           05/07/2022 3:54 PM           End PACC Summary     Discharge Date:  05/08/2022    Referral Source  Signed by: [name] Brunette, RN  Date Time: 05/07/22 3:51 PM      End PACC Note

## 2022-05-10 NOTE — Progress Notes (Signed)
Attempted to contact pt's son [place] at [phone]- left vm message on machine for son to return the call for pt. Re: COPD Management.    Julieanne Cotton. MS, [facility]  Case Manager  [facility]  [address]  [place], [place] [address]  Ph.: [phone]

## 2022-05-11 ENCOUNTER — Ambulatory Visit (INDEPENDENT_AMBULATORY_CARE_PROVIDER_SITE_OTHER): Payer: Medicare Other

## 2022-05-11 NOTE — Progress Notes (Signed)
Attempted to contact pt's son [place] at [phone]- left vm message on machine for son to return the call for pt. Re: COPD Management.    Of note;    Pt. Has an appt. Today at the [facility].    CM to follow-up.    Julieanne Cotton. MS, [facility]  Case Manager  [facility]  [address]  [place], [place] [address]  Ph.: [phone]

## 2022-05-13 NOTE — Progress Notes (Signed)
Made in error

## 2022-05-18 NOTE — Progress Notes (Signed)
Attempted to contact pt's son [place] at [phone]  for 3rd and Final attempt at contact for COPD Management. Left vm message on machine for son to return the call as needed at any time in the future for case management services.       Of note;     Pt. Was a no-show at the [facility] on 05/11/22. Pt. Does have a scheduled appt. With Dr. Ilda Mori on 06/16/22.    ACM case discharged due to inability to contact.    Julieanne Cotton. MS, [facility]  Case Manager  [facility]  [address]  [place], [place] [address]  Ph.: [phone]

## 2022-06-16 ENCOUNTER — Ambulatory Visit (INDEPENDENT_AMBULATORY_CARE_PROVIDER_SITE_OTHER): Payer: Medicare Other | Admitting: Family Medicine

## 2022-08-27 ENCOUNTER — Emergency Department: Payer: Medicare Other

## 2022-08-27 ENCOUNTER — Emergency Department
Admission: EM | Admit: 2022-08-27 | Discharge: 2022-08-27 | Disposition: A | Discharge status: Home / Self Care | Payer: Medicare Other | Attending: Emergency Medicine | Admitting: Emergency Medicine

## 2022-08-27 DIAGNOSIS — N139 Obstructive and reflux uropathy, unspecified: Secondary | ICD-10-CM | POA: Insufficient documentation

## 2022-08-27 DIAGNOSIS — N200 Calculus of kidney: Secondary | ICD-10-CM

## 2022-08-27 DIAGNOSIS — N210 Calculus in bladder: Secondary | ICD-10-CM

## 2022-08-27 DIAGNOSIS — R339 Retention of urine, unspecified: Secondary | ICD-10-CM | POA: Insufficient documentation

## 2022-08-27 DIAGNOSIS — R319 Hematuria, unspecified: Secondary | ICD-10-CM | POA: Insufficient documentation

## 2022-08-27 LAB — URINALYSIS REFLEX TO MICROSCOPIC EXAM - REFLEX TO CULTURE
Bilirubin, UA: NEGATIVE
Glucose, UA: NEGATIVE
Ketones UA: NEGATIVE
Nitrite, UA: NEGATIVE
Protein, UR: 30 — AB
Specific Gravity UA: 1.009 (ref 1.001–1.035)
Urine pH: 7 (ref 5.0–8.0)
Urobilinogen, UA: NEGATIVE mg/dL (ref 0.2–2.0)

## 2022-08-27 NOTE — ED Notes (Signed)
Bladder scan showed 215 ml in bladder.

## 2022-08-27 NOTE — ED Provider Notes (Signed)
EMERGENCY DEPARTMENT NOTE     Patient initially seen and examined at   ED PHYSICIAN ASSIGNED       Date/Time Event User Comments    08/27/22 0101 Physician Assigned Yehuda Mao, Alycia Rossetti A. Ronnie Derby, MD assigned as Attending           ED MIDLEVEL (APP) ASSIGNED       None            HISTORY OF PRESENT ILLNESS   {Translator Used (Optional):59393}    Chief Complaint: Urinary Retention       71 y.o. male with past medical history as below who presents to the emergency department with ***    Independent Historian (other than patient): {Historian:59024}  Additional History Provided by Independent Historian:  MEDICAL HISTORY     Past Medical History:  Past Medical History:   Diagnosis Date    Collapsed lung     Emphysema lung     Enlarged prostate        Past Surgical History:  Past Surgical History:   Procedure Laterality Date    CYSTOSCOPY, LITHOLAPAXY N/A 01/04/2020    Procedure: Gabriel Rainwater;  Surgeon: Miquel Dunn, MD;  Location: [facility];  Service: Urology;  Laterality: N/A;  CYSTOLITHOLAPAXY    PROSTATE SURGERY         Social History:  Social History     Socioeconomic History    Marital status: Unknown   Tobacco Use    Smoking status: Former     Years: 50     Types: Cigarettes     Quit date: 06/26/2022     Years since quitting: 0.1    Smokeless tobacco: Never   Vaping Use    Vaping Use: Never used   Substance and Sexual Activity    Alcohol use: Never    Drug use: Never       Family History:  History reviewed. No pertinent family history.    Outpatient Medication:  Previous Medications    FLUTICASONE FUROATE-VILANTEROL (BREO ELLIPTA) 100-25 MCG/ACT AEROSOL PWDR, BREATH ACTIVATED    Inhale 1 puff into the lungs daily    NICOTINE (NICODERM CQ) 21 MG/24HR    Place 1 patch onto the skin daily    PHENAZOPYRIDINE (PYRIDIUM) 200 MG TABLET    Take 1 tablet (200 mg total) by mouth 3 (three) times daily as needed for Pain    PREDNISONE (DELTASONE) 20 MG TABLET    Take 2 tablets (40 mg) by mouth every morning  with breakfast For 5 days then one tab daily for 5 days    SENNA-DOCUSATE (PERICOLACE) 8.6-50 MG PER TABLET    Take 1 tablet by mouth every 12 (twelve) hours    TAMSULOSIN (FLOMAX) 0.4 MG CAP    Take 1 capsule (0.4 mg) by mouth Daily after dinner         REVIEW OF SYSTEMS   Review of Systems See History of Present Illness  PHYSICAL EXAM     ED Triage Vitals [08/27/22 0048]   Enc Vitals Group      BP (!) 217/119      Heart Rate (!) 123      Resp Rate 20      Temp 97.9 F (36.6 C)      Temp Source Oral      SpO2 98 %      Weight (!) 35.7 kg      Height       Head Circumference  Peak Flow       Pain Score 7      Pain Loc       Pain Edu?       Excl. in GC?      Physical Exam   ***  MEDICAL DECISION MAKING     PRIMARY PROBLEM LIST      {CEP ACUITY:59028} DIAGNOSIS:***  {Chronic Illness Impacting Care of the above problem:59030} {Explain (Optional):59078}  Differential Diagnosis: ***  DISCUSSION      71 y.o. male who presents to the ED with ***               {If patient is being hospitalized is severe sepsis or septic shock suspected?:59467}      {Was management discussed with a consultant?:59037}  {Was the decision around the need for surgery discussed with consultant:59056::"N/A"}  {External Records Reviewed?:59023}    Additional Notes    {Diagnostic test considered and not performed:59031::"N/A"}  {Prescription medications considered and not given:59033::"N/A"}  {Hospitalization considered but not done:59032::"N/A"}  {Social Determinants of Health Considerations:59036::"N/A"}  {Was there decision to not resuscitate or to de-escalate care due to poor prognosis?:59057}      Vital Signs: Reviewed the patient's vital signs.   Nursing Notes: Reviewed and utilized available nursing notes.  Medical Records Reviewed: Reviewed available past medical records.  Counseling: The emergency provider has spoken with the patient and discussed today's findings, in addition to providing specific details for the plan of care.   Questions are answered and there is agreement with the plan.        CARDIAC STUDIES    The following cardiac studies were independently interpreted by the Emergency Medicine Physician.  For full cardiac study results please see chart.    ***    MIPS DOCUMENTATION    {ORAL ANTIBIOTICS given for otitis externa due to:59079}  {ACUTE BRONCHITIS Medical reasoning for giving this patient oral antibiotics for acute bronchitis are the following (Optional):59080}  {Medical reasoning for giving this patient oral antibiotics for acute sinusitis are the following (Optional):59081}  {HEAD TRAUMA Indications for head CT due to trauma (Optional):59082}      RADIOLOGY IMAGING STUDIES      CT Abd/Pelvis without Contrast    (Results Pending)       EMERGENCY DEPT. MEDICATIONS      ED Medication Orders (From admission, onward)      None            LABORATORY RESULTS    Ordered and independently interpreted AVAILABLE laboratory tests.   Results       Procedure Component Value Units Date/Time    Urinalysis Reflex to Microscopic Exam- Reflex to Culture [[id number]] Collected: 08/27/22 0109    Specimen: Urine, Clean Catch Updated: 08/27/22 0109              CRITICAL CARE/PROCEDURES    Procedures    DIAGNOSIS      Diagnosis:  Final diagnoses:   None       Disposition:  ED Disposition       None            Prescriptions:  Patient's Medications   New Prescriptions    No medications on file   Previous Medications    FLUTICASONE FUROATE-VILANTEROL (BREO ELLIPTA) 100-25 MCG/ACT AEROSOL PWDR, BREATH ACTIVATED    Inhale 1 puff into the lungs daily    NICOTINE (NICODERM CQ) 21 MG/24HR    Place 1 patch onto the skin daily    PHENAZOPYRIDINE (  PYRIDIUM) 200 MG TABLET    Take 1 tablet (200 mg total) by mouth 3 (three) times daily as needed for Pain    PREDNISONE (DELTASONE) 20 MG TABLET    Take 2 tablets (40 mg) by mouth every morning with breakfast For 5 days then one tab daily for 5 days    SENNA-DOCUSATE (PERICOLACE) 8.6-50 MG PER TABLET    Take 1  tablet by mouth every 12 (twelve) hours    TAMSULOSIN (FLOMAX) 0.4 MG CAP    Take 1 capsule (0.4 mg) by mouth Daily after dinner   Modified Medications    No medications on file   Discontinued Medications    No medications on file           This note was generated by the Epic EMR system/ Dragon speech recognition and may contain inherent errors or omissions not intended by the user. Grammatical errors, random word insertions, deletions and pronoun errors  are occasional consequences of this technology due to software limitations. Not all errors are caught or corrected. If there are questions or concerns about the content of this note or information contained within the body of this dictation they should be addressed directly with the author for clarification.

## 2022-08-31 ENCOUNTER — Ambulatory Visit (INDEPENDENT_AMBULATORY_CARE_PROVIDER_SITE_OTHER): Payer: Medicare Other

## 2022-08-31 ENCOUNTER — Encounter (INDEPENDENT_AMBULATORY_CARE_PROVIDER_SITE_OTHER): Payer: Self-pay

## 2022-08-31 DIAGNOSIS — N210 Calculus in bladder: Secondary | ICD-10-CM

## 2022-08-31 DIAGNOSIS — N138 Other obstructive and reflux uropathy: Secondary | ICD-10-CM

## 2022-08-31 DIAGNOSIS — R339 Retention of urine, unspecified: Secondary | ICD-10-CM

## 2022-08-31 DIAGNOSIS — N200 Calculus of kidney: Secondary | ICD-10-CM

## 2022-08-31 DIAGNOSIS — N401 Enlarged prostate with lower urinary tract symptoms: Secondary | ICD-10-CM

## 2022-08-31 MED ORDER — OXYBUTYNIN CHLORIDE 5 MG PO TABS
5.0000 mg | ORAL_TABLET | Freq: Three times a day (TID) | ORAL | 3 refills | Status: DC
Start: 2022-08-31 — End: 2022-09-07

## 2022-08-31 NOTE — Progress Notes (Signed)
Subjective:   Patient ID: [name] [name] is a 71 y.o. male     Chief Complaint:  1. Urinary retention  2. BPH   3. Bladder stone   4. Nephrolithiasis     Patient is a pleasant 71 y/o male who presents with his daughter after recent ED visit on 08/27/22 for suprapubic discomfort, urinary retention, and hematuria. CT KUB showed right renal and 2 bladder stones- with the largest stone measuring up to 2.2 cm. Prostate gland also looks enlarged with prominent indentation in the urinary bladder. Foley catheter was placed. Does have history of bladder stones causing obstruction, requiring litholapaxy in 2021. Taking Flomax 0.4 mg daily. Patient complains of penile pain due to the catheter and leaking of urine around catheter. Denies fever or chills.       The following portions of the patient's history were reviewed and updated as appropriate: allergies, current medications, past family history, past medical history, past social history, past surgical history and problem list.    Review of Systems  History obtained from the patient  PER HPI   Objective:   There were no vitals taken for this visit.  General appearance - alert, well appearing, and in no distress  Mental status - alert, oriented to person, place, and time, appropriate affect  Skin: Normal temperature, turgor and texture; no rash or ulcers  Neck - Supple, symmetrical, trachea midline, NROM  Chest - Normal chest wall excursion; symmetric air entry   Heart - normal rate and regular rhythm  Abdomen - soft, nontender, nondistended  GU Male - no penile lesions or discharge, foley catheter draining clear dark urine     Lab Review   08/28/22   No growth of >1,000 CFU/ML, No further work     Component 01/04/20 12:06 PM   Kidney Stone Interpretation SEE BELOW    Comment: 80% Calcium phosphate (brushite)   20% Calcium phosphate (apatite)   Test Performed by:   [facility]   [address] [place], [place], [place] [address]   Lab Director:  Paul Dykes M.D. Ph.D.; CLIA# [id number]        Radiology Review   CT Abd/Pelvis without Contrast [[id number]] (Order [id number])  Status: Final result     Study Result    Narrative & Impression   CT ABDOMEN PELVIS WO IV/ WO PO CONT     CLINICAL INDICATION:   acute lower abdominal pain, hx of kidney stones, now  has urinary retenstion     COMPARISON: 12/28/2019     TECHNIQUE: 2.5 mm axial images through the abdomen and pelvis without oral  or intravenous contrast administration with sagittal and coronal  reformatted images. The following  dose reduction techniques were utilized:  automated exposure control and/or adjustment of the mA and/or kV according  to patient size, and the use of iterative reconstruction technique.     FINDINGS:       LUNG BASES: There are changes of emphysema in the included lung bases.       ABDOMEN: The gallbladder appears partially contracted. There are multiple  right renal stones. The liver, spleen, pancreas, adrenal glands, and  kidneys otherwise appear grossly unremarkable within limitations of a  noncontrasted study. There is no free fluid or free intraperitoneal air in  the abdomen. There are atherosclerotic calcifications of the aorta and its  branches as well as iliac arteries. There is aneurysmal dilatation of the  infrarenal abdominal aorta which measures up to 3.3  cm in AP diameter with  corresponding transverse diameter of 3.6 cm.     PELVIS: There is no free pelvic fluid. The prostate gland appears prominent  with nodular indentation the base the urinary bladder. There is a Foley  catheter in the bladder lumen. There are 2 stones noted in the bladder  lumen with the larger stone measuring up to 2.2 cm. There is air noted in  the bladder lumen. Evaluation of the small bowel and colon is limited given  lack of oral contrast opacification and lack of significant  intra-abdominal/pelvic fat. There is no evidence to suggest bowel  obstruction. There is stool noted throughout the  colon. The appendix is not  definitively seen. Bone windows demonstrate osteopenia and degenerative  changes. Small nodular sclerotic osseous foci are nonspecific.      IMPRESSION:            1. Right renal and bladder stones as described above.     2. Abdominal aortic aneurysm as described above.     3. Additional incidental findings as described above.     Sandie Ano, MD  08/27/2022 2:06 AM     Assessment:     1. Urinary retention    2. Benign prostatic hyperplasia with urinary obstruction    3. Bladder stone    4. Right kidney stone      Recommend for patient to not attempt void trial today due to time of day and requirement of going to the ER if he was unable to urinate after catheter removal.   Due to size of bladder stone and also his enlarged prostate, will schedule him for cystoscopy with Dr. Leonor Liv. He may ultimately benefit from  both litholapaxy and TURP procedure.   Continue Flomax daily.   Will start him on Oxybutynin as needed for bladder spasms.     Management and care for this patient was reviewed with the supervising physician.      Plan:     Patient Instructions   I recommend to keep the catheter in place for now.   Your prostate is very enlarged and looks like it is causing an obstruction. You also have large bladder stones that could also be obstructing urine.   Continue taking Flomax 0.4 mg daily.   I have sent oxybutynin 5 mg, take up to 3 times a day as needed for bladder pain.   Increase fluid intake, drink at least 6 glasses of water daily.   We will schedule you for a cystoscopy in the next 1-2 weeks with Dr. Leonor Liv.   Ultimately, you will most likely need a TURP and surgery to remove the bladder stones.       Orders  No orders of the defined types were placed in this encounter.

## 2022-08-31 NOTE — Patient Instructions (Addendum)
I recommend to keep the catheter in place for now.   Your prostate is very enlarged and looks like it is causing an obstruction. You also have large bladder stones that could also be obstructing urine.   Continue taking Flomax 0.4 mg daily.   I have sent oxybutynin 5 mg, take up to 3 times a day as needed for bladder pain.   Increase fluid intake, drink at least 6 glasses of water daily.   We will schedule you for a cystoscopy in the next 1-2 weeks with Dr. Leonor Liv.   Ultimately, you will most likely need a TURP and surgery to remove the bladder stones.

## 2022-09-07 ENCOUNTER — Ambulatory Visit (INDEPENDENT_AMBULATORY_CARE_PROVIDER_SITE_OTHER): Payer: Medicare Other | Admitting: Urology

## 2022-09-07 ENCOUNTER — Encounter (INDEPENDENT_AMBULATORY_CARE_PROVIDER_SITE_OTHER): Payer: Self-pay | Admitting: Urology

## 2022-09-07 VITALS — BP 147/92 | HR 96 | Temp 97.6°F | Ht 62.8 in | Wt 82.0 lb

## 2022-09-07 DIAGNOSIS — N210 Calculus in bladder: Secondary | ICD-10-CM

## 2022-09-07 DIAGNOSIS — N138 Other obstructive and reflux uropathy: Secondary | ICD-10-CM

## 2022-09-07 DIAGNOSIS — N401 Enlarged prostate with lower urinary tract symptoms: Secondary | ICD-10-CM

## 2022-09-07 DIAGNOSIS — R339 Retention of urine, unspecified: Secondary | ICD-10-CM

## 2022-09-07 LAB — URINALYSIS POC
POCT Urine Bilirubin: NEGATIVE
POCT Urine Glucose: NEGATIVE mg/dL
POCT Urine Ketones: NEGATIVE mg/dL
POCT Urine Nitrites: POSITIVE — AB
POCT Urine Urobilibogen: 0.2 mg/dL (ref 0.0–1.0)
POCT Urine pH: 6.5 (ref 5.0–8.0)
Protein, UR POCT: >=300 mg/dL — AB
Urine Specific Gravity POC: 1.025 (ref 1.001–1.035)

## 2022-09-07 LAB — URINE MICROSCOPIC

## 2022-09-07 LAB — PSA, TOTAL AND FREE
PSA Free and Total Ratio: 0.215
PSA, Free: 1 ng/mL — ABNORMAL HIGH (ref 0.000–0.500)
Prostate Specific Antigen, Total: 4.651 ng/mL — ABNORMAL HIGH (ref 0.000–4.000)

## 2022-09-07 MED ORDER — ALFUZOSIN HCL ER 10 MG PO TB24
10.0000 mg | ORAL_TABLET | Freq: Every day | ORAL | 3 refills | Status: DC
Start: 2022-09-07 — End: 2023-02-09

## 2022-09-07 NOTE — Procedures (Signed)
Male Cystourethroscopy    The patient was placed in the supine position after which the 30 French flexible cystoscope was passed into the urethral meatus with sterile lubricant jelly. The urethra was visualized upon entering. The prostatic urethra was viewed from the veru. The bladder was then entered and filled with adequate fluid to allow good visualization. The lateral, posterior and anterior walls and dome were inspected. The ureteral orifices were identified, with efflux evaluated, and the trigone and bladder neck were also visualized. The cystoscope was then removed while viewing the prostatic urethra and penile urethra in antegrade fashion.    Findings:    Meatus Open    Urethra: Open    Prostate: Obstructing lateral lobes and Slightly enlarged median lobe    Bladder: Bladder stone noted    Trabeculation with cellules      Trigone: Normal    Orifices: Normally positioned with clear efflux bilaterally      Assessment:     1. Urinary retention  2. BPH   3. Bladder stone   4. Nephrolithiasis      71 y/o male who presents with his daughter after recent ED visit on 08/27/22 for suprapubic discomfort, urinary retention, and hematuria. CT KUB showed right renal and 2 bladder stones- with the largest stone measuring up to 2.2 cm. Prostate gland also looks enlarged with prominent indentation in the urinary bladder. Foley catheter was placed. Does have history of bladder stones causing obstruction, requiring litholapaxy in 2021.       Notes:     Prior to initiating the procedure, I verified the patient's identity by name and date of birth. I reviewed medications and allergies. The procedure was explained to the patient, including the benefits and risks, and the patient or their guardian was allowed time to ask questions or express concerns. Written consent was obtained.  Any relevant testing or applicable imaging was reviewed. If visible, the site of the procedure was agreed upon by me and the patient, and marked as  needed.      Your cystoscopy shows an obstructing prostate with large bladder stone.  Based on these findings, I recommend proceeding with a cystoscopy, cystolitholapaxy, and TURP  Plan to start on Alfuzosin 10mg  daily.  We will check your PSA today.  Please contact Stefanie at [phone] to schedule surgery.

## 2022-09-07 NOTE — Patient Instructions (Signed)
Your cystoscopy shows an obstructing prostate with large bladder stone.  Based on these findings, I recommend proceeding with a cystoscopy, cystolitholapaxy, and TURP  Plan to start on Alfuzosin 10mg  daily.  We will check your PSA today.  Please contact Stefanie at [phone] to schedule surgery.

## 2022-09-07 NOTE — Progress Notes (Signed)
Subjective:    Patient ID: [name] [name] is a 71 y.o. male     Chief Complaint:  1. Urinary retention  2. BPH   3. Bladder stone   4. Nephrolithiasis      71 y/o male who presents with his daughter after recent ED visit on 08/27/22 for suprapubic discomfort, urinary retention, and hematuria. CT KUB showed right renal and 2 bladder stones- with the largest stone measuring up to 2.2 cm. Prostate gland also looks enlarged with prominent indentation in the urinary bladder. Foley catheter was placed. Does have history of bladder stones causing obstruction, requiring litholapaxy in 2021.     The following portions of the patient's history were reviewed and updated as appropriate: allergies, current medications, past family history, past medical history, past social history, past surgical history and problem list.    Review of Systems  History obtained from the patient  General ROS: negative for - chills, fatigue, fever or weight loss  Psychological ROS: negative for - depression, disorientation or memory difficulties  Ophthalmic ROS: negative for - blurry vision or loss of vision  Hematological and Lymphatic ROS: negative for - bleeding problems, night sweats or swollen lymph nodes  Endocrine ROS: negative for - malaise/lethargy, polydipsia/polyuria or skin changes  Respiratory ROS: no cough, shortness of breath, or wheezing  Cardiovascular ROS: no chest pain or dyspnea on exertion  Gastrointestinal ROS: no abdominal pain, change in bowel habits, or black or bloody stools  Genito-Urinary ROS:  as per HPI  Musculoskeletal ROS: negative for - joint pain or muscle pain  Neurological ROS: negative for - headaches, impaired coordination/balance or numbness/tingling    Objective:    BP (!) 147/92 (BP Site: Left arm, Patient Position: Sitting, Cuff Size: Medium)   Pulse 96   Temp 97.6 F (36.4 C)   Ht 1.595 m (5' 2.8")   Wt (!) 37.2 kg (82 lb)   BMI 14.62 kg/m   General appearance - alert, well appearing, and in no  distress  Mental status - alert, oriented to person, place, and time  Chest - no use of accessory respiratory muscles    Lab Review     No results found for: "PSA"    Radiology Review   CT A/P reviewed.  Assessment:     1. Benign prostatic hyperplasia with urinary obstruction    2. Bladder stone    3. Urinary retention      Plan:      Patient Instructions   Your cystoscopy shows an obstructing prostate with large bladder stone.  Based on these findings, I recommend proceeding with a cystoscopy, cystolitholapaxy, and TURP  Plan to start on Alfuzosin 10mg  daily.  We will check your PSA today.  Please contact Stefanie at [phone] to schedule surgery.    Orders  Orders Placed This Encounter   Procedures    Urine culture     Order Specific Question:   Release to patient     Answer:   Immediate    PSA, total and free     Order Specific Question:   Release to patient     Answer:   Immediate

## 2022-09-09 ENCOUNTER — Other Ambulatory Visit (INDEPENDENT_AMBULATORY_CARE_PROVIDER_SITE_OTHER): Payer: Self-pay | Admitting: Urology

## 2022-09-09 ENCOUNTER — Encounter (INDEPENDENT_AMBULATORY_CARE_PROVIDER_SITE_OTHER): Payer: Self-pay

## 2022-09-09 DIAGNOSIS — N401 Enlarged prostate with lower urinary tract symptoms: Secondary | ICD-10-CM

## 2022-09-10 ENCOUNTER — Encounter (INDEPENDENT_AMBULATORY_CARE_PROVIDER_SITE_OTHER): Payer: Medicare Other | Admitting: Urology

## 2022-09-10 ENCOUNTER — Encounter (INDEPENDENT_AMBULATORY_CARE_PROVIDER_SITE_OTHER): Payer: Self-pay

## 2022-09-20 ENCOUNTER — Encounter (INDEPENDENT_AMBULATORY_CARE_PROVIDER_SITE_OTHER): Payer: Self-pay

## 2022-09-21 ENCOUNTER — Encounter (INDEPENDENT_AMBULATORY_CARE_PROVIDER_SITE_OTHER): Payer: Self-pay

## 2022-09-23 NOTE — PEC In-Person Visit (H&P) (Addendum)
Pre-Anesthesia Evaluation     Pre-op Interview visit requested by:   Reason for pre-op interview visit: Patient anticipating CYSTOSCOPY, TRANSURETHRAL RESECTION OF PROSTATE , TURP procedure.    Language Assistant  Interpreter: Patient refused facility's interpreter and insists a companion is used to interpret. *WAIVER REQUIRED* (Patient's son, Da Authement provides interpretation. Waiver signed)    History of Present Illness/Summary:  Patient presents with his son, [name], to the [facility] clinic for a pre-operative evaluation.    Assessment/Plan:  Encounter for Pre-Operative Examination (Z01.818)  Labs of 04/2022 reviewed  METS > 5. RCRI = 0. No active cardiopulmonary symptoms  No personal history of complications related to anesthesia    Orders Placed This Encounter   Procedures    CBC and differential    TSH, Abn Reflex to Free T4, Serum    Comprehensive metabolic panel    Hemolysis index    Referral to Primary Care ([place])      Patient evaluated in [facility] today. Final determination pending review of lab results.    ADDENDUM 09/24/22: Labs reviewed, CMP, CBC, TSH wnl.   Patient is at elevated risk for perioperative CV complications 2/2 h/o smoking, emphysema, coronary artery calcifications and BMI 14, however, not prohibitive to proceeding with planned procedure.     Surgical Diagnosis: Benign prostatic hyperplasia with urinary obstruction [N40.1, N13.8]  Ongoing hematuria and dysuria  Continue Afluzosin    H/o anemia, resolved   Suspect 2/2 chronic hematuria and dietary deficiency  Trend CBC today and send iron studies if anemic  H/H improved today to 12.8/40.5     History of smoking  Emphysema  Smoked cigarettes for 50 years ~12pk/yr history  Quit in July 2023  H/o emphysema and DOE; improving since quitting smoking. Not currently using inhalers   Patient encouraged to abstain from smoking     BMI 14  Health maintenance  Patient endorses low appetite. Weight is stable without recent weight loss  Encouraged protein shakes and  increasing intake of carbs and protein   Discussed risks associated with low weight and malnutrition  Referral placed to establish with PCP   Check TSH and CMP    Ascending aorta dilation   Coronary artery calcifications on CT  CT 08/27/22 shows mildly dilated infrarenal aorta at 3.3cm in AP, 3.6cm in transverse  CT chest 05/06/22 shows calcifications in coronary arteries and thoracic aorta  Recommend initiating ASA and statin therapy if not otherwise contraindicated      Patient voiced understanding of all instructions.  All questions and concerns addressed at this time. This assessment will be conveyed to the surgery and anesthesiology teams & the patient will be evaluated on the day of surgery.     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   09/24/2022    Overview Signed 09/24/2022  9:38 AM by Lollie Marrow, PA     3.6cm on CT 08/27/2022         History of  smoking Z87.891   09/24/2022    Overview Signed 09/24/2022  9:38 AM by Lollie Marrow, PA     Quit July 2023         BMI less than 19,adult Z68.1   09/24/2022    Abnormal weight R68.89   09/24/2022    Pulmonary emphysema, unspecified emphysema type J43.9   09/24/2022    Coronary artery calcification seen on CT scan I25.10   09/24/2022        Medical History   Diagnosis Date    Collapsed lung     Emphysema lung     Enlarged prostate      Past Surgical History:   Procedure Laterality Date    CYSTOSCOPY, LITHOLAPAXY N/A 01/04/2020    Procedure: Gabriel Rainwater;  Surgeon: Miquel Dunn, MD;  Location: [facility];  Service: Urology;  Laterality: N/A;   CYSTOLITHOLAPAXY    PROSTATE SURGERY  2019        Medication List            Accurate as of September 24, 2022  1:45 PM. Always use your most recent med list.                alfuzosin 10 MG 24 hr tablet  Take 1 tablet (10 mg) by mouth daily  Commonly known as: UROXATRAL  Medication Adjustments for Surgery: Take as prescribed            No Known Allergies  Social History     Occupational History    Not on file   Tobacco Use    Smoking status: Former     Packs/day: 0.25     Years: 50.00     Additional pack years: 0.00     Total pack years: 12.50     Types: Cigarettes     Quit date: 06/26/2022     Years since quitting: 0.2    Smokeless tobacco: Never   Vaping Use    Vaping Use: Never used   Substance and Sexual Activity    Alcohol use: Never    Drug use: Never    Sexual activity: Not on file       Exam Scores:   SDB score  Risk Category: No Risk    STBUR score       PONV score  Nausea Risk: MODERATE RISK    MST score  MST Score: 0    Allergy score  Risk Category: Low Risk    Frailty score  CFS Score: 4    MICA       RCRI score  RCRI Score: 0    DASI  DASI Score: 23.45  METs Level: 5.62    EA-DIVA          Visit Vitals  BP 134/86   Pulse 94   Ht 1.595 m (5' 2.8")   Wt (!) 37.2 kg (82 lb)   SpO2 97%   BMI 14.62 kg/m       Review of Systems   Constitutional: Negative.    HENT: Negative.     Eyes: Negative.    Respiratory:  Positive for shortness of breath. Negative for cough and hemoptysis.    Cardiovascular: Negative.    Gastrointestinal: Negative.    Genitourinary:  Positive for dysuria and hematuria. Negative for flank pain.   Musculoskeletal:  Positive for joint pain.        Hand arthritis   Skin: Negative.    Neurological: Negative.    Endo/Heme/Allergies:  Negative.    Psychiatric/Behavioral: Negative.         Physical Exam:  Mallampati: I  TM distance: > 3 FB (> 6 cm)  Mouth opening: > 3 FB (> 6 cm)  Neck extension: full  Upper lip bite assessment: class II    Denture status: upper dentures      Mental status: alert  and oriented x3  Speech: normal    Heart rhythm: regular  no murmur:    (-) a murmur and peripheral edema    Breath sounds clear to auscultation bilaterally        Abdomen: soft      Recent Labs   CBC (last 180 days) 05/04/22  0608 09/24/22  0915   WBC 17.88* 7.41   RBC 4.04* 4.52   Hgb 11.3* 12.8   Hematocrit 37.1* 40.5   MCV 91.8 89.6   MCH 28.0 28.3   MCHC 30.5* 31.6   RDW 13 13   Platelets 260 336   MPV 9.5 9.1   Nucleated RBC 0.0 0.0   Absolute NRBC 0.00 0.00     Recent Labs   BMP (last 180 days) 05/07/22  0504 09/24/22  0915   Glucose 95 83   BUN 36.0* 17.0   Creatinine 0.8 0.8   Sodium 144 142   Potassium 4.6 4.5   Chloride 103 105   CO2 34* 29   Calcium 9.1 9.6   Anion Gap 7.0 8.0   eGFR >60.0 >60.0         Recent Labs   Other (last 180 days) 05/01/22  0516 05/01/22  0944 05/01/22  0946 05/03/22  0611 09/24/22  0915   TSH, Abn Reflex to Free T4, Serum  --   --   --   --  0.42   Bilirubin, Total 0.3  --   --   --  0.4   ALT 17  --   --   --  11   AST (SGOT) 30  --   --   --  16   Protein, Total 7.4  --   --   --  7.0   Hemoglobin A1C  --   --   --  5.5  --    Culture MRSA Surveillance  --  Negative for Methicillin Resistant Staph aureus Negative for Methicillin Resistant Staph aureus  --   --    SARS-CoV-2 Specimen Source Nasal Swab  --   --   --   --    SARS CoV 2 Overall Result Not Detected  --   --   --   --              Anesthesia Plan:  ASA 3   Anesthetic Options Discussed: General  Potential anesthesia/Perioperative problems: Increased Risk of Cardiac Event and Other (BMI 14, h/o smoking with emphysema)    DNR status: Patient or surrogate requests that DNR status remain full code.  Discussed DNR status with: Patient and Surrogate   DNR Status Surrogate: Adult son/daughter    A discussion with regarding next steps to prepare for the procedure and the planned anesthesia care took place during today's visit.  I explained that the patient will meet with their anesthesiology providers on the DOS.  Discussed  with Patient      Acceptability of blood products: Accepted  Use of blood products discussed with Patient and Surrogate  Blood Products Surrogate: Adult son/daughter

## 2022-09-24 ENCOUNTER — Ambulatory Visit (INDEPENDENT_AMBULATORY_CARE_PROVIDER_SITE_OTHER): Payer: Medicare Other | Admitting: Physician Assistant

## 2022-09-24 ENCOUNTER — Encounter (INDEPENDENT_AMBULATORY_CARE_PROVIDER_SITE_OTHER): Payer: Self-pay | Admitting: Physician Assistant

## 2022-09-24 VITALS — BP 134/86 | HR 94 | Ht 62.8 in | Wt 82.0 lb

## 2022-09-24 DIAGNOSIS — Z01818 Encounter for other preprocedural examination: Secondary | ICD-10-CM

## 2022-09-24 DIAGNOSIS — I2510 Atherosclerotic heart disease of native coronary artery without angina pectoris: Secondary | ICD-10-CM

## 2022-09-24 DIAGNOSIS — Z681 Body mass index (BMI) 19 or less, adult: Secondary | ICD-10-CM

## 2022-09-24 DIAGNOSIS — I77810 Thoracic aortic ectasia: Secondary | ICD-10-CM

## 2022-09-24 DIAGNOSIS — Z87891 Personal history of nicotine dependence: Secondary | ICD-10-CM

## 2022-09-24 DIAGNOSIS — R6889 Other general symptoms and signs: Secondary | ICD-10-CM | POA: Insufficient documentation

## 2022-09-24 DIAGNOSIS — J439 Emphysema, unspecified: Secondary | ICD-10-CM

## 2022-09-24 LAB — COMPREHENSIVE METABOLIC PANEL
ALT: 11 U/L (ref 0–55)
AST (SGOT): 16 U/L (ref 5–41)
Albumin/Globulin Ratio: 1.2 (ref 0.9–2.2)
Albumin: 3.8 g/dL (ref 3.5–5.0)
Alkaline Phosphatase: 90 U/L (ref 37–117)
Anion Gap: 8 (ref 5.0–15.0)
BUN: 17 mg/dL (ref 9.0–28.0)
Bilirubin, Total: 0.4 mg/dL (ref 0.2–1.2)
CO2: 29 mEq/L (ref 17–29)
Calcium: 9.6 mg/dL (ref 7.9–10.2)
Chloride: 105 mEq/L (ref 99–111)
Creatinine: 0.8 mg/dL (ref 0.5–1.5)
Globulin: 3.2 g/dL (ref 2.0–3.6)
Glucose: 83 mg/dL (ref 70–100)
Potassium: 4.5 mEq/L (ref 3.5–5.3)
Protein, Total: 7 g/dL (ref 6.0–8.3)
Sodium: 142 mEq/L (ref 135–145)
eGFR: >60 mL/min/{1.73_m2} (ref >=60)

## 2022-09-24 LAB — CBC AND DIFFERENTIAL
Absolute NRBC: 0 10*3/uL (ref 0.00–0.00)
Basophils Absolute Automated: 0.08 10*3/uL (ref 0.00–0.08)
Basophils Automated: 1.1 %
Eosinophils Absolute Automated: 0.22 10*3/uL (ref 0.00–0.44)
Eosinophils Automated: 3 %
Hematocrit: 40.5 % (ref 37.6–49.6)
Hgb: 12.8 g/dL (ref 12.5–17.1)
Immature Granulocytes Absolute: 0.02 10*3/uL (ref 0.00–0.07)
Immature Granulocytes: 0.3 %
Instrument Absolute Neutrophil Count: 4.57 10*3/uL (ref 1.10–6.33)
Lymphocytes Absolute Automated: 1.73 10*3/uL (ref 0.42–3.22)
Lymphocytes Automated: 23.3 %
MCH: 28.3 pg (ref 25.1–33.5)
MCHC: 31.6 g/dL (ref 31.5–35.8)
MCV: 89.6 fL (ref 78.0–96.0)
MPV: 9.1 fL (ref 8.9–12.5)
Monocytes Absolute Automated: 0.79 10*3/uL (ref 0.21–0.85)
Monocytes: 10.7 %
Neutrophils Absolute: 4.57 10*3/uL (ref 1.10–6.33)
Neutrophils: 61.6 %
Nucleated RBC: 0 /100 WBC (ref 0.0–0.0)
Platelets: 336 10*3/uL (ref 142–346)
RBC: 4.52 10*6/uL (ref 4.20–5.90)
RDW: 13 % (ref 11–15)
WBC: 7.41 10*3/uL (ref 3.10–9.50)

## 2022-09-24 LAB — HEMOLYSIS INDEX(SOFT): Hemolysis Index: 3 Index (ref 0–24)

## 2022-09-24 LAB — THYROID STIMULATING HORMONE (TSH) WITH REFLEX TO FREE T4: TSH, Abn Reflex to Free T4, Serum: 0.42 u[IU]/mL (ref 0.35–4.94)

## 2022-09-26 NOTE — H&P (Signed)
ADMISSION HISTORY AND PHYSICAL EXAM    Date Time: 09/26/22 3:12 PM  Patient Name: [name] [name],[name] [name]  Attending Physician: Miquel Dunn, MD    Assessment:   BPH with Obstruction  Bladder Outlet Obstruction  Bladder Stones    Plan:   To OR for Cystoscopy, TURP, Cystolitholapaxy.    History of Present Illness:   [name] [name] is a 71 y.o. male who presents to the hospital with a h/o BPH with obstruction, BOO, and bladder stones.  Here for TURP, Cystolitholapaxy.    Past Medical History:     Past Medical History:   Diagnosis Date    Collapsed lung     Emphysema lung     Enlarged prostate        Past Surgical History:     Past Surgical History:   Procedure Laterality Date    CYSTOSCOPY, LITHOLAPAXY N/A 01/04/2020    Procedure: Gabriel Rainwater;  Surgeon: Miquel Dunn, MD;  Location: [facility];  Service: Urology;  Laterality: N/A;  CYSTOLITHOLAPAXY    PROSTATE SURGERY  2019       Family History:     Family History   Problem Relation Age of Onset    No known problems Mother     No known problems Father        Social History:     Social History     Socioeconomic History    Marital status: Legally Separated     Spouse name: Not on file    Number of children: Not on file    Years of education: Not on file    Highest education level: Not on file   Occupational History    Not on file   Tobacco Use    Smoking status: Former     Packs/day: 0.25     Years: 50.00     Additional pack years: 0.00     Total pack years: 12.50     Types: Cigarettes     Quit date: 06/26/2022     Years since quitting: 0.2    Smokeless tobacco: Never   Vaping Use    Vaping Use: Never used   Substance and Sexual Activity    Alcohol use: Never    Drug use: Never    Sexual activity: Not on file   Other Topics Concern    Not on file   Social History Narrative    Not on file     Social Determinants of Health     Financial Resource Strain: Not on file   Food Insecurity: Not on file   Transportation Needs: No Transportation Needs (09/07/2022)    PRAPARE -  Therapist, art (Medical): No     Lack of Transportation (Non-Medical): No   Physical Activity: Not on file   Stress: Not on file   Social Connections: Not on file   Intimate Partner Violence: Not on file   Housing Stability: Not on file       Allergies:   No Known Allergies    Medications:     No medications prior to admission.       Review of Systems:   A comprehensive review of systems was: Negative    Physical Exam:   There were no vitals filed for this visit.    Intake and Output Summary (Last 24 hours) at Date Time  No intake or output data in the 24 hours ending 09/26/22 1512    AFVSS  NAD,  A/O x 3  RRR  CTAB  Abdomen benign    Labs:     Results       ** No results found for the last 24 hours. **            Recent CBC No results for input(s): "RBC", "HGB", "HCT", "MCV", "MCH", "MCHC", "RDW", "MPV", "LABPLAT" in the last 24 hours.    Invalid input(s): "WHITEBLOODCE", "NRBCA", "REFLX", "ANRBA"  Recent BMP No results for input(s): "GLU", "BUN", "CREAT", "CA", "NA", "K", "CL", "CO2" in the last 24 hours.    Invalid input(s): "AGAP"  Recent CMP No results for input(s): "GLU", "BUN", "CREAT", "NA", "CK", "CL", "CO2", "CA", "ALB", "AST", "ALT", "ALP", "GLOB" in the last 24 hours.    Invalid input(s): "TP", "BILIT", "AG", "AGAP"  Recent URINALYSIS Invalid input(s): "UMACB", "UTYP", "UCOLR", "UAPP", "USPG", "UPH", "ULEU", "UNIT", "UPRO", "UGLU", "UKET", "UURO", "UBIL", "UBLD", "UTYPP"  Recent BLOOD CULTURE No results for input(s): "CXBLD" in the last 24 hours.  Recent URINE CULTURE Invalid input(s): "CXURN"  Recent PT/PTT No results for input(s): "INR", "PTT" in the last 24 hours.    Invalid input(s): "PTI", "COUM", "COUMP", "ACOAG", "ACOAP"  Recent PT/INR No results for input(s): "INR" in the last 24 hours.    Invalid input(s): "PTI", "COUM", "COUMP"    Rads:   Radiological Procedure reviewed.    Signed by: Miquel Dunn, MD

## 2022-09-27 ENCOUNTER — Observation Stay: Payer: Medicare Other

## 2022-09-27 ENCOUNTER — Encounter
Admission: RE | Disposition: A | Discharge status: Home / Self Care | Payer: Self-pay | Source: Ambulatory Visit | Attending: Urology

## 2022-09-27 ENCOUNTER — Ambulatory Visit
Admission: RE | Admit: 2022-09-27 | Discharge: 2022-09-28 | Disposition: A | Discharge status: Home / Self Care | Payer: Medicare Other | Source: Ambulatory Visit | Attending: Urology | Admitting: Urology

## 2022-09-27 ENCOUNTER — Ambulatory Visit: Payer: Self-pay

## 2022-09-27 DIAGNOSIS — I2584 Coronary atherosclerosis due to calcified coronary lesion: Secondary | ICD-10-CM | POA: Insufficient documentation

## 2022-09-27 DIAGNOSIS — N138 Other obstructive and reflux uropathy: Secondary | ICD-10-CM | POA: Insufficient documentation

## 2022-09-27 DIAGNOSIS — N401 Enlarged prostate with lower urinary tract symptoms: Secondary | ICD-10-CM | POA: Insufficient documentation

## 2022-09-27 DIAGNOSIS — R338 Other retention of urine: Secondary | ICD-10-CM | POA: Insufficient documentation

## 2022-09-27 DIAGNOSIS — N320 Bladder-neck obstruction: Secondary | ICD-10-CM | POA: Insufficient documentation

## 2022-09-27 DIAGNOSIS — J439 Emphysema, unspecified: Secondary | ICD-10-CM | POA: Insufficient documentation

## 2022-09-27 DIAGNOSIS — N210 Calculus in bladder: Secondary | ICD-10-CM | POA: Insufficient documentation

## 2022-09-27 DIAGNOSIS — I2510 Atherosclerotic heart disease of native coronary artery without angina pectoris: Secondary | ICD-10-CM | POA: Insufficient documentation

## 2022-09-27 DIAGNOSIS — I77810 Thoracic aortic ectasia: Secondary | ICD-10-CM | POA: Insufficient documentation

## 2022-09-27 DIAGNOSIS — N3289 Other specified disorders of bladder: Secondary | ICD-10-CM | POA: Insufficient documentation

## 2022-09-27 DIAGNOSIS — Z87891 Personal history of nicotine dependence: Secondary | ICD-10-CM | POA: Insufficient documentation

## 2022-09-27 DIAGNOSIS — R339 Retention of urine, unspecified: Secondary | ICD-10-CM

## 2022-09-27 HISTORY — PX: CYSTOSCOPY, TURP: SHX3621

## 2022-09-27 LAB — CBC
Absolute NRBC: 0 10*3/uL (ref 0.00–0.00)
Hematocrit: 37.9 % (ref 37.6–49.6)
Hgb: 11.7 g/dL — ABNORMAL LOW (ref 12.5–17.1)
MCH: 27.7 pg (ref 25.1–33.5)
MCHC: 30.9 g/dL — ABNORMAL LOW (ref 31.5–35.8)
MCV: 89.6 fL (ref 78.0–96.0)
MPV: 8.9 fL (ref 8.9–12.5)
Nucleated RBC: 0 /100 WBC (ref 0.0–0.0)
Platelets: 287 10*3/uL (ref 142–346)
RBC: 4.23 10*6/uL (ref 4.20–5.90)
RDW: 13 % (ref 11–15)
WBC: 10.77 10*3/uL — ABNORMAL HIGH (ref 3.10–9.50)

## 2022-09-27 SURGERY — CYSTOSCOPY, WITH TRANSURETHRAL RESECTION PROSTATE (TURP)
Anesthesia: Anesthesia General | Site: Pelvis | Wound class: Clean Contaminated

## 2022-09-27 MED ORDER — LIDOCAINE HCL 2 % IJ SOLN
INTRAMUSCULAR | Status: DC | PRN
Start: 2022-09-27 — End: 2022-09-27
  Administered 2022-09-27: 50 mg via INTRAVENOUS

## 2022-09-27 MED ORDER — DOCUSATE SODIUM 100 MG PO CAPS
100.0000 mg | ORAL_CAPSULE | Freq: Two times a day (BID) | ORAL | Status: DC
Start: 2022-09-27 — End: 2022-09-28
  Administered 2022-09-27 – 2022-09-28 (×2): 100 mg via ORAL
  Filled 2022-09-27 (×2): qty 1

## 2022-09-27 MED ORDER — ACETAMINOPHEN 325 MG PO TABS
650.0000 mg | ORAL_TABLET | ORAL | Status: DC | PRN
Start: 2022-09-27 — End: 2022-09-28
  Administered 2022-09-27: 650 mg via ORAL
  Filled 2022-09-27: qty 2

## 2022-09-27 MED ORDER — PHENYLEPHRINE HCL 10 MG/ML IV SOLN (WRAP)
Status: DC | PRN
Start: 2022-09-27 — End: 2022-09-27
  Administered 2022-09-27 (×5): 100 ug via INTRAVENOUS

## 2022-09-27 MED ORDER — LIDOCAINE HCL (PF) 2 % IJ SOLN
INTRAMUSCULAR | Status: AC
Start: 2022-09-27 — End: ?
  Filled 2022-09-27: qty 5

## 2022-09-27 MED ORDER — BACITRACIN +/- ZINC 500 UNIT/GM EX OINT (WRAP)
TOPICAL_OINTMENT | CUTANEOUS | Status: DC | PRN
Start: 2022-09-27 — End: 2022-09-28
  Filled 2022-09-27: qty 15

## 2022-09-27 MED ORDER — DEXAMETHASONE SODIUM PHOSPHATE 20 MG/5ML IJ SOLN
INTRAMUSCULAR | Status: AC
Start: 2022-09-27 — End: ?
  Filled 2022-09-27: qty 5

## 2022-09-27 MED ORDER — DEXAMETHASONE SODIUM PHOSPHATE 4 MG/ML IJ SOLN (WRAP)
INTRAMUSCULAR | Status: DC | PRN
Start: 2022-09-27 — End: 2022-09-27
  Administered 2022-09-27: 4 mg via INTRAVENOUS

## 2022-09-27 MED ORDER — PHENYLEPHRINE 100 MCG/ML IV SOSY (WRAP)
PREFILLED_SYRINGE | INTRAVENOUS | Status: AC
Start: 2022-09-27 — End: ?
  Filled 2022-09-27: qty 10

## 2022-09-27 MED ORDER — HYDROMORPHONE HCL 0.5 MG/0.5 ML IJ SOLN: 0.2000 mg | INTRAMUSCULAR | Status: DC | PRN

## 2022-09-27 MED ORDER — LACTATED RINGERS IV SOLN
INTRAVENOUS | Status: DC | PRN
Start: 2022-09-27 — End: 2022-09-27

## 2022-09-27 MED ORDER — HYDROMORPHONE HCL 1 MG/ML IJ SOLN
INTRAMUSCULAR | Status: DC | PRN
Start: 2022-09-27 — End: 2022-09-27
  Administered 2022-09-27 (×2): .5 mg via INTRAVENOUS

## 2022-09-27 MED ORDER — SODIUM CHLORIDE 0.9% BAG (IRRIGATION USE)
INTRAVENOUS | Status: DC | PRN
Start: 2022-09-27 — End: 2022-09-27
  Administered 2022-09-27 (×9): 3000 mL

## 2022-09-27 MED ORDER — PROPOFOL 10 MG/ML IV EMUL (WRAP)
INTRAVENOUS | Status: AC
Start: 2022-09-27 — End: ?
  Filled 2022-09-27: qty 20

## 2022-09-27 MED ORDER — PROPOFOL 10 MG/ML IV EMUL (WRAP)
INTRAVENOUS | Status: DC | PRN
Start: 2022-09-27 — End: 2022-09-27
  Administered 2022-09-27: 150 mg via INTRAVENOUS
  Administered 2022-09-27: 50 mg via INTRAVENOUS

## 2022-09-27 MED ORDER — ONDANSETRON HCL 4 MG/2ML IJ SOLN
4.0000 mg | Freq: Once | INTRAMUSCULAR | Status: DC | PRN
Start: 2022-09-27 — End: 2022-09-27

## 2022-09-27 MED ORDER — FENTANYL CITRATE (PF) 50 MCG/ML IJ SOLN (WRAP)
INTRAMUSCULAR | Status: DC | PRN
Start: 2022-09-27 — End: 2022-09-27
  Administered 2022-09-27 (×4): 25 ug via INTRAVENOUS

## 2022-09-27 MED ORDER — LACTATED RINGERS IV SOLN
INTRAVENOUS | Status: DC
Start: 2022-09-27 — End: 2022-09-28

## 2022-09-27 MED ORDER — OXYCODONE HCL 5 MG PO TABS
5.0000 mg | ORAL_TABLET | Freq: Once | ORAL | Status: DC | PRN
Start: 2022-09-27 — End: 2022-09-27

## 2022-09-27 MED ORDER — OXYBUTYNIN CHLORIDE 5 MG PO TABS
5.0000 mg | ORAL_TABLET | Freq: Four times a day (QID) | ORAL | Status: DC | PRN
Start: 2022-09-27 — End: 2022-09-28
  Administered 2022-09-27: 5 mg via ORAL
  Filled 2022-09-27: qty 1

## 2022-09-27 MED ORDER — ACETAMINOPHEN 325 MG PO TABS
650.0000 mg | ORAL_TABLET | Freq: Once | ORAL | Status: DC | PRN
Start: 2022-09-27 — End: 2022-09-27

## 2022-09-27 MED ORDER — OXYCODONE HCL 10 MG PO TABS
10.0000 mg | ORAL_TABLET | ORAL | Status: DC | PRN
Start: 2022-09-27 — End: 2022-09-28

## 2022-09-27 MED ORDER — LIDOCAINE 4 % EX CREA
TOPICAL_CREAM | Freq: Once | CUTANEOUS | Status: DC | PRN
Start: 2022-09-27 — End: 2022-09-27

## 2022-09-27 MED ORDER — ONDANSETRON HCL 4 MG/2ML IJ SOLN
INTRAMUSCULAR | Status: AC
Start: 2022-09-27 — End: ?
  Filled 2022-09-27: qty 2

## 2022-09-27 MED ORDER — CEFTRIAXONE SODIUM 1 G IJ SOLR
INTRAMUSCULAR | Status: AC
Start: 2022-09-27 — End: ?
  Filled 2022-09-27: qty 1000

## 2022-09-27 MED ORDER — PROPOFOL 10 MG/ML IV EMUL (WRAP)
INTRAVENOUS | Status: AC
Start: 2022-09-27 — End: ?
  Filled 2022-09-27: qty 40

## 2022-09-27 MED ORDER — FENTANYL CITRATE (PF) 50 MCG/ML IJ SOLN (WRAP)
25.0000 ug | INTRAMUSCULAR | Status: DC | PRN
Start: 2022-09-27 — End: 2022-09-27

## 2022-09-27 MED ORDER — GLYCOPYRROLATE 0.2 MG/ML IJ SOLN (WRAP)
INTRAMUSCULAR | Status: AC
Start: 2022-09-27 — End: ?
  Filled 2022-09-27: qty 2

## 2022-09-27 MED ORDER — HYDROMORPHONE HCL 0.5 MG/0.5 ML IJ SOLN
0.5000 mg | INTRAMUSCULAR | Status: DC | PRN
Start: 2022-09-27 — End: 2022-09-27

## 2022-09-27 MED ORDER — OXYCODONE HCL 5 MG PO TABS
5.0000 mg | ORAL_TABLET | ORAL | Status: DC | PRN
Start: 2022-09-27 — End: 2022-09-28

## 2022-09-27 MED ORDER — STERILE WATER FOR INJECTION IJ/IV SOLN (WRAP)
1.0000 g | Freq: Once | INTRAMUSCULAR | Status: DC
Start: 2022-09-27 — End: 2022-09-27

## 2022-09-27 MED ORDER — EPHEDRINE SULFATE 50 MG/ML IJ/IV SOLN (WRAP)
Status: AC
Start: 2022-09-27 — End: ?
  Filled 2022-09-27: qty 1

## 2022-09-27 MED ORDER — EPHEDRINE SULFATE 50 MG/ML IJ/IV SOLN (WRAP)
Status: DC | PRN
Start: 2022-09-27 — End: 2022-09-27
  Administered 2022-09-27 (×3): 10 mg via INTRAVENOUS

## 2022-09-27 MED ORDER — SODIUM CHLORIDE 0.9 % IV SOLN
INTRAVENOUS | Status: DC
Start: 2022-09-27 — End: 2022-09-28

## 2022-09-27 MED ORDER — LIDOCAINE HCL 1 % IJ SOLN
1.0000 mL | Freq: Once | INTRAMUSCULAR | Status: DC | PRN
Start: 2022-09-27 — End: 2022-09-27

## 2022-09-27 MED ORDER — HYDROMORPHONE HCL 1 MG/ML IJ SOLN
INTRAMUSCULAR | Status: AC
Start: 2022-09-27 — End: ?
  Filled 2022-09-27: qty 1

## 2022-09-27 MED ORDER — ONDANSETRON HCL 4 MG/2ML IJ SOLN
INTRAMUSCULAR | Status: DC | PRN
Start: 2022-09-27 — End: 2022-09-27
  Administered 2022-09-27: 4 mg via INTRAVENOUS

## 2022-09-27 MED ORDER — FENTANYL CITRATE (PF) 50 MCG/ML IJ SOLN (WRAP)
INTRAMUSCULAR | Status: AC
Start: 2022-09-27 — End: ?
  Filled 2022-09-27: qty 2

## 2022-09-27 MED ORDER — ONDANSETRON HCL 4 MG/2ML IJ SOLN
4.0000 mg | Freq: Three times a day (TID) | INTRAMUSCULAR | Status: DC | PRN
Start: 2022-09-27 — End: 2022-09-28
  Administered 2022-09-27: 4 mg via INTRAVENOUS
  Filled 2022-09-27: qty 2

## 2022-09-27 MED ORDER — CEFTRIAXONE SODIUM 1 G IJ SOLR
INTRAMUSCULAR | Status: DC | PRN
Start: 2022-09-27 — End: 2022-09-27
  Administered 2022-09-27: 1 g via INTRAVENOUS

## 2022-09-27 SURGICAL SUPPLY — 39 items
BAG DRAINAGE URINARY (Procedure Accessories) ×1 IMPLANT
CANISTER SCT 12L LG MDVC GRDN CLT CLR (Suction) IMPLANT
CATHETER URETHRAL OD22 FR 30 CC FOLEY 3 (Catheter Urine) ×1
CATHETER URETHRAL OD22 FR FOLEY 3 WAY 2 OPPOSE DRAINAGE EYE MEDIUM (Catheter Urine) ×1 IMPLANT
CATHETER URTH BACTI-GRD RBR DDRGL 30CC (Catheter Urine) ×1
CUTTING LOOP STRAUSS THICK BIPOLAR (Cautery) ×1 IMPLANT
DRESSING PETRO GZE VSLN 18X3IN LF STRL (Dressing) ×1 IMPLANT
EVACUATOR URO URVC LTX ADPR IRR BLDR (Urology Supply) ×1
EVACUATOR UROLOGY ADAPTER IRRIGATION (Urology Supply) ×1
EVACUATOR UROLOGY ADAPTER IRRIGATION UROVAC BLADDER (Urology Supply) ×1 IMPLANT
GLOVE SRG PLISPRN 7.5 BGL PI MIC LF STRL (Glove) ×1
GLOVE SURGICAL 7.5 BIOGEL PI MICRO (Glove) ×1
GLOVE SURGICAL 7.5 BIOGEL PI MICRO POWDER FREE ROUGH BEAD CUFF (Glove) ×1 IMPLANT
GOWN PREVNTN+ XLNG XLRG STRL (Gown) ×1
GOWN SURGICAL XL XLONG MEDLINE YELLOW (Gown) ×1
GOWN SURGICAL XL XLONG YELLOW BREATHABLE FILM LEVEL 4 (Gown) ×1 IMPLANT
HOLDER CATH VLCR UNV CATH-MATE LF ADJ (Procedure Accessories) ×1
HOLDER CATHETER UNIVERSAL ADJUSTABLE (Procedure Accessories) ×1 IMPLANT
SET IRR 300- MMHG LVL 1 NORMOFLO LF STRL (Urology Supply) ×1
SET IRRIGATION 300- MMHG AUTOMATIC GAS (Urology Supply) ×1
SET IRRIGATION 300- MMHG AUTOMATIC GAS VENT 2 SPIKE SETUP (Urology Supply) ×1 IMPLANT
SET SRGBSN LF STRL TRNSF DISP (Kits) ×1
SET SUCTION MTP TUBING (Tubing) ×1 IMPLANT
SET SUCTION TUBING (Tubing) ×1
SET SURGICAL BASIN TRANSFER (Kits) ×1
SET SURGICAL BASIN TRANSFER MEDLINE INDUSTRIES, INC. (Kits) ×1 IMPLANT
SET TUBING STRL SCT DISP (Tubing) ×1
SOLUTION IRR 0.9% NACL 3L URMTC LF PLS (Irrigation Solutions) ×8
SOLUTION IRRIGATION 0.9% SODIUM CHLORIDE 3000 ML PLASTIC CONTAINER (Irrigation Solutions) ×8 IMPLANT
SOLUTION SRGSCRB 10% PVP IOD 4OZ PLS BTL (Scrub Supplies) ×1
SOLUTION SURGICAL SCRUB 10% PVP IODINE 4OZ PLASTIC BOTTLE AQUEOUS SKIN (Scrub Supplies) ×1 IMPLANT
SYRINGE 30 ML LUER LOCK MEDLINE MEDICAL (Needles) ×1 IMPLANT
SYRINGE MED 30ML LF STRL LL (Needles) ×1
TRAY SRG CYSTOSCOPY IFMC (Pack) ×1
TRAY SURGICAL CYSTOSCOPY (Pack) ×1 IMPLANT
VAPORIZATION ELECTRODE STRAUSS BIPOLAR (Cautery) IMPLANT
WATER STERILE PLASTIC POUR BOTTLE 1000 (Irrigation Solutions) ×3
WATER STERILE PLASTIC POUR BOTTLE 1000 ML (Irrigation Solutions) ×3 IMPLANT
WATER STRL 1000ML LF PLS PR BTL (Irrigation Solutions) ×3

## 2022-09-27 NOTE — Plan of Care (Addendum)
NURSING PROGRESS NOTE    Patient Name: [name] [name],[name] [name]   Code Status: F  Admit Date: 09/27/2022 1:40 PM  Date Time: 09/27/22 1:40 PM   Hospital Day #: 0    Date of Admission:   09/27/2022    Reason for Admission:   Benign prostatic hyperplasia with urinary obstruction [N40.1, N13.8]    Procedures:     Procedure(s):  CYSTOSCOPY, TRANSURETHRAL RESECTION OF PROSTATE , TURP, CYSTOLITHOPLAXY    Day of Surgery  -------------------          Nursing Note:     Vitals: Stable,   Neuro: A&Ox4, vietnamese speaking. Slow to respond  Respiratory: WDL, RA  Cardiac: no tele, WDL  GI: clear liquid diet, advance as tolerated. X1 emesis. Zofran administered. Effective. Flatus(+ve)  GU: CBI running at slow, clear urine  Skin: Intact  Wound 09/27/22 Surgical Incision Penis Other (Comment) n/a (Active)   Date First Assessed/Time First Assessed: 09/27/22 1022   Wound Type: Surgical Incision  Location: Penis  Wound Location Orientation: Other (Comment)  Wound Description (Comments): n/a      Assessments 09/27/2022 10:15 AM 09/27/2022  1:06 PM   Site Description Other (Comment) Other (Comment)       No associated orders.       Wound 09/27/22 Surgical Incision Scrotum Other (Comment) n/a (Active)   Date First Assessed/Time First Assessed: 09/27/22 1022   Wound Type: Surgical Incision  Location: Scrotum  Wound Location Orientation: Other (Comment)  Wound Description (Comments): n/a      No assessment data to display       No associated orders.     Musculoskeletal: can move all ext w/o difficulty.  Pain: spasm, oxybutyin administered, effective. Pain managed w tylenol  Mobility: x1 assist w walker.   LDA/Drips: NSS 140ml/hr  Patient Lines/Drains/Airways Status       Active Lines, Drains and Airways       Name Placement date Placement time Site Days    Peripheral IV 09/27/22 20 G Right Antecubital 09/27/22  0721  Antecubital  less than 1    Urethral Catheter Straight-tip 16 Fr. 08/27/22  0100  Straight-tip  31    Urethral Catheter Triple-lumen;Latex  22 Fr. 09/27/22  0950  Triple-lumen;Latex  less than 1                  Fall Score/Safety: bed locked in lowest position, fall mat in place, bedside table/call bell/personal belongings within reach    Shift Events: CBI running  @ slow rate.   Plan: monitor CBI, Manage pain, walk. Clear liquid diet  Problem: Moderate/High Fall Risk Score >5  Goal: Patient will remain free of falls  09/27/2022 1338 by Leanne Chang, RN  Outcome: Progressing  Flowsheets (Taken 09/27/2022 1338)  High (Greater than 13):   HIGH-Consider use of low bed   HIGH-Initiate use of floor mats as appropriate   HIGH-Bed alarm on at all times while patient in bed  09/27/2022 1338 by Leanne Chang, RN  Outcome: Progressing

## 2022-09-27 NOTE — Anesthesia Postprocedure Evaluation (Signed)
Anesthesia Post Evaluation    Patient: [name] [name]    Procedure(s):  CYSTOSCOPY, TRANSURETHRAL RESECTION OF PROSTATE , TURP, CYSTOLITHOPLAXY    Anesthesia type: general    Last Vitals:   Vitals Value Taken Time   BP 149/79 09/27/22 1220   Temp 36.1 C (97 F) 09/27/22 1220   Pulse 86 09/27/22 1220   Resp 12 09/27/22 1220   SpO2 95 % 09/27/22 1220                 Anesthesia Post Evaluation:     Patient Evaluated: PACU  Patient Participation: complete - patient participated  Level of Consciousness: awake and alert    Pain Management: adequate    Airway Patency: patent        Anesthetic complications: No          Cardiovascular status: acceptable  Respiratory status: acceptable  Hydration status: acceptable          Signed by: Enzo Bi, MD, 09/27/2022 1:07 PM

## 2022-09-27 NOTE — Anesthesia Preprocedure Evaluation (Addendum)
Anesthesia Evaluation    AIRWAY    Mallampati: III    TM distance: >3 FB  Neck ROM: full  Mouth Opening:full   CARDIOVASCULAR    regular and normal       DENTAL        (+) upper dentures               PULMONARY    clear to auscultation     OTHER FINDINGS    Allergies:  No Known Allergies  All Rx:  Scheduled Meds:Current Facility-Administered Medications:  cefTRIAXone, 1 g, Intravenous, Once      Continuous Infusions:lactated ringers      PRN Meds:.lidocaine, lidocaine  Problem List:  Patient Active Problem List    Benign prostatic hyperplasia with urinary obstruction         Date Noted: 09/27/2022      Ascending aorta dilation         Date Noted: 09/24/2022      History of smoking         Date Noted: 09/24/2022      BMI less than 19,adult         Date Noted: 09/24/2022      Abnormal weight         Date Noted: 09/24/2022      Pulmonary emphysema, unspecified emphysema type         Date Noted: 09/24/2022      Coronary artery calcification seen on CT scan         Date Noted: 09/24/2022      Bladder stone         Date Noted: 12/28/2019      Urinary retention         Date Noted: 12/28/2019      Benign prostatic hyperplasia with lower urinary tract symptoms         Date Noted: 12/28/2019      Right kidney stone         Date Noted: 12/28/2019      History:  Past Medical History:  No date: Collapsed lung  No date: Emphysema lung  No date: Enlarged prostate  Past Surgical History:  01/04/2020: Gabriel Rainwater; N/A      Comment:  Procedure: Gabriel Rainwater;  Surgeon: Miquel Dunn, MD;  Location: [facility];  Service:                Urology;  Laterality: N/A;  CYSTOLITHOLAPAXY  2019: PROSTATE SURGERY  Labs    Lab Results       Component                Value               Date                       WBC                      7.41                09/24/2022                 HGB                      12.8  09/24/2022                 HCT                      40.5                 09/24/2022                 PLT                      336                 09/24/2022                 ALT                      11                  09/24/2022                 AST                      16                  09/24/2022                 NA                       142                 09/24/2022                 K                        4.5                 09/24/2022                 CL                       105                 09/24/2022                 CO2                      29                  09/24/2022                 CREAT                    0.8                 09/24/2022                 BUN                      17.0                09/24/2022                 TSH  0.42                09/24/2022                 GLU                      83                  09/24/2022                 HGBA1C                   5.5                 05/03/2022                 MG                       1.8                 05/03/2022                Enzo BiBrian A Shalin Linders, MD, MD                                    Relevant Problems   PULMONARY   (+) Pulmonary emphysema, unspecified emphysema type      CARDIO   (+) Ascending aorta dilation   (+) Coronary artery calcification seen on CT scan      GU/RENAL   (+) Right kidney stone               Anesthesia Plan    ASA 3     general                     intravenous induction               informed consent obtained    Plan discussed with CRNA.                   Signed by: Enzo BiBrian A Louise Victory, MD 09/27/22 7:52 AM

## 2022-09-27 NOTE — Discharge Instr - AVS First Page (Signed)
Transurethral Resection of the Prostate (TURP): Home Recovery   Take it easy for the first month or so while you heal after transurethral resection of the prostate. During the first few weeks, you may feel burning when you pass urine. You may also feel like you have to urinate often. These sensations will go away. If your urine becomes bright red, it means that the treated area is bleeding. This may happen on and off for a month or so after a TURP. If this occurs, rest and drink plenty of fluids until the bleeding stops.     While you are healing  To help prevent problems during the first month after your surgery, follow these tips:   Drink plenty of fluids.  Don't do strenuous exercise.  Don't lift anything over 10 pounds.  Don't have sex.  Don't strain at passing stool. If you are constipated, take stool softeners or laxatives for a few days.  Talk with your healthcare provider about when you can return to work.  Ask your healthcare provider when you can start driving again.  Don't sit for more than 60 minutes without getting up.  Check with your healthcare providrr before taking over-the-counter pain relievers. These include aspirin, ibuprofen, and naproxen.  Follow-up care  You will visit your healthcare provider to make sure you are healing without problems. If tests were done on your prostate tissue your healthcare provider will discuss the results with you.   When to call your healthcare provider  Call your healthcare provider right away if any of these occur:   You're not able to urinate, or notice a decrease in urine flow  You have a fever of  100.36F ( 38C) or higher, or as directed by your healthcare provider  You have severe pain that is not relieved by prescription pain medicine  You have bleeding that doesn't stop within 12 hours  You have bleeding with clots, or blood plugs up the catheter. (A little blood in the urine is normal.)  The catheter falls out  Getting back to sex  BPH and its treatments  rarely cause problems with sex. Even if you have retrograde ejaculation, your erection or orgasm shouldn't feel any different than it used to. Retrograde ejaculation can result in infertility, as the semen will not come out of the penis. If you notice any problems with sex, talk to your healthcare provider. Help may be available.   Trouble controlling your urine  Some men will have trouble controlling their urine (urinary incontinence) after this surgery. This may last for a few days, weeks, or months, but it will improve with time. You may also pass your urine more often (urinary frequency), like you did before the surgery. This will also improve as you start to heal.   Discharge Instructions: Caring for Your Indwelling Urinary Catheter  You have been discharged with an indwelling urinary catheter (also called a Foley catheter ). A catheter is a thin, flexible tube. An indwelling urinary catheter has two parts. The first part is a tube that drains urine from your bladder. The secondpart is a bag or other device that collects the urine.   The most important thing to remember is that you want to prevent infection.Always wash your hands before handling your catheter bag or tubing.   Draining the bedside bag  Wash your hands with soap and clean, running water. Or use an alcohol-based hand sanitizer that contains at least 60% alcohol.  Hold the drainage tube over a  toilet or measuring container.  Unclamp the tube and let the bag drain.  Don't touch the tip of the drainage tube or let it touch the toilet or container.  You don't need to rinse the bag or drainage tube.    Cleaning the drainage tube  When the bag is empty, clean the tip of the drainage tube with an alcohol wipe.  Clamp the tube.  Reinsert the tube into the pocket on the drainage bag.    Cleaning your skin and tubing  Clean the skin near the catheter with soap and water.  Wash your genital area from front to back.  Wash the catheter tubing. Always wash the  catheter in the direction away from your body.  You will be told when and how to change your bag and tubing.  Don't try to remove the catheter by yourself.  You may shower with the catheter in place.    Emptying a leg bag  Wash your hands.  Remove the stopper on the bag.  Drain the bag into the toilet or a measuring container. Don't let the tip of the drainage tube touch anything, including your fingers.  Clean the tip of the drainage tube with alcohol.  Replace the stopper.    Follow-up care  Make a follow-up appointment, or as directed by your healthcare provider   When to call your healthcare provider  Call your healthcare provider right away if you have any of the following:  Fever of 100.7F ( 38C) or higher, or as directed by your provider  Chills  Leakage around the catheter insertion site  Increased spasms (uncontrollable twitching) in your legs, belly (abdomen), or bladder. Occasional mild spasms are normal.  Burning in the urinary tract, penis, or genital area  Nausea and vomiting  Aching in the lower back  Cloudy or bloody (pink or red) urine, sediment or mucus in the urine, or bad-smelling urine  [employer] last reviewed this educational content on12/11/2017     2000-2022 [employer], [place]. All rights reserved. This information is not intended as a substitute for professional medical care. Always follow yourhealthcare professional's instructions.    Foley Catheter Removal  Your healthcare provider has instructed you to remove your indwelling urinary catheter, which is also called a Foley catheter. This is a thin, flexible tube that allows urine to drain out of your bladder and into a bag. It's important to correctly remove your catheter to help prevent infection and other complications. If you have any questions about removing the Foley catheter, ask your healthcare provider before trying to remove it. Otherwise,follow the instructions on this sheet. YOU MAY REMOVE YOUR FOLEY CATHETER ON Thursday  November 2, at 6:00 AM.  Please scan QR code below ([email or web address]) to watch a video on how to remove your foley catheter      Foley catheter  The Foley catheter is held in place by a small balloon that's filled with water. To remove the catheter, you must first drain the water from the balloon. This is done using a syringe and the balloon port. This is the opening in the catheter that isn't attached to the bag. It allows you to get to the balloon.      The syringe is put into the balloon port to allow the balloon to empty. Once the balloon is empty, the catheter can be removed.     Instructions for removing the catheter   Follow the directions closely. If the catheter doesn't come out with  gentle pulling, stop and call your healthcare provider rightaway.   Empty the bag of urine if needed.  Wash your hands with soap and clean, running water. Dry them well.  Gather your supplies. This includes a wastebasket, a towel, and a syringe that was given to you by your healthcare provider.  Put the syringe into the balloon port on the catheter. The syringe fits tightly into the port with a firm push and twist motion.  Alternatively, you can cut the balloon port while standing in the shower and allow all water to drain  Wait as the water from the balloon empties into the syringe. Depending on how large the balloon is, you may need to repeat this process several times until all of the water is out of the balloon.  Once the balloon is emptied, gently pull out the catheter.  Put the used catheter in the wastebasket. Also throw away the syringe.  Use the towel to wipe up any spilled water or urine if needed.  Wash your hands again.  When to call your healthcare provider  Call the healthcare provider right away if:   You have a fever of  100.4 F ( 38C ) or higher, or as directed by your provider  You have questions about removing the catheter  The catheter doesn't come out with gentle pulling  You  can't urinate within  8 hours after removing the catheter  Your belly (abdomen) is painful or bloated  You have burning pain with urination that lasts for  24 hours  You see a lot of blood in the urine. Light bleeding for  24 hours is normal.  It feels like the bladder is not emptying  StayWell last reviewed this educational content on12/11/2017     2000-2022 [employer], [place]. All rights reserved. This information is not intended as a substitute for professional medical care. Always follow yourhealthcare professional's instructions.    Take cefdinir antibiotic - 1 tablet two times daily for 5 days.

## 2022-09-27 NOTE — Plan of Care (Incomplete)
Problem: Moderate/High Fall Risk Score >5  Goal: Patient will remain free of falls  Outcome: Progressing  Flowsheets (Taken 09/27/2022 2154)  High (Greater than 13):   HIGH-Consider use of low bed   HIGH-Initiate use of floor mats as appropriate   HIGH-Pharmacy to initiate evaluation and intervention per protocol   HIGH-Bed alarm on at all times while patient in bed   HIGH-Visual cue at entrance to patient's room     Problem: Safety  Goal: Patient will be free from injury during hospitalization  Outcome: Progressing  Flowsheets (Taken 09/27/2022 2154)  Patient will be free from injury during hospitalization:   Provide and maintain safe environment   Ensure appropriate safety devices are available at the bedside  Goal: Patient will be free from infection during hospitalization  Outcome: Progressing  Flowsheets (Taken 09/27/2022 2154)  Free from Infection during hospitalization:   Monitor lab/diagnostic results   Monitor all insertion sites (i.e. indwelling lines, tubes, urinary catheters, and drains)     Problem: Pain  Goal: Pain at adequate level as identified by patient  Outcome: Progressing  Flowsheets (Taken 09/27/2022 2154)  Pain at adequate level as identified by patient:   Identify patient comfort function goal   Evaluate if patient comfort function goal is met     Problem: SCIP  Goal: SCIP measures are followed  Outcome: Progressing  Flowsheets (Taken 09/27/2022 2154)  SCIP measures are followed: Provide VTE prophylaxis within 24 hours of anesthesia end time (Note: SCD to lower extremities)     Problem: Infection Prevention  Goal: Free from infection  Outcome: Progressing  Flowsheets (Taken 09/27/2022 2154)  Free from infection:   Monitor/assess vital signs   Assess for signs and symptoms of infection   Monitor/assess lab values and report abnormal values     Problem: Constipation  Goal: Fluid and electrolyte balance are achieved/maintained  Outcome: Progressing  Flowsheets (Taken 09/27/2022 2154)  Fluid  and electrolyte balance are achieved/maintained:   Provide adequate hydration   Monitor/assess lab values and report abnormal values   Monitor intake and output every shift  Goal: Nutritional intake is adequate  Outcome: Progressing  Flowsheets (Taken 09/27/2022 2154)  Nutritional intake is adequate:   Allow adequate time for meals   Encourage/perform oral hygiene as appropriate  Goal: Mobility/Activity is maintained at optimal level for patient  Outcome: Progressing  Flowsheets (Taken 09/27/2022 2154)  Mobility/activity is maintained at optimal level for patient:   Encourage independent activity per ability   Plan activities to conserve energy, plan rest periods   Perform active/passive ROM     Problem: Pain interferes with ability to perform ADL  Goal: Pain at adequate level as identified by patient  Outcome: Progressing  Flowsheets (Taken 09/27/2022 2154)  Pain at adequate level as identified by patient:   Identify patient comfort function goal   Evaluate if patient comfort function goal is met     Problem: Bladder/Voiding  Goal: Remains continent  Outcome: Progressing  Flowsheets (Taken 09/27/2022 2154)  Remains continent: Monitor intake and output  Goal: Perineal skin integrity is maintained or improved  Outcome: Progressing  Flowsheets (Taken 09/27/2022 2154)  Perineal skin integrity is maintained or improved:   Keep intact skin clean and dry   Apply urinary containment device as appropriate and/or per order  Goal: Free from infection  Outcome: Progressing  Flowsheets (Taken 09/27/2022 2154)  Free from infection: Monitor/assess for signs and symptoms of infection

## 2022-09-27 NOTE — Transfer of Care (Signed)
Anesthesia Transfer of Care Note    Patient: [name] [name]    Procedures performed: Procedure(s):  CYSTOSCOPY, TRANSURETHRAL RESECTION OF PROSTATE , TURP, CYSTOLITHOPLAXY    Anesthesia type: General LMA    Patient location:PACU    Last vitals:   Vitals:    09/27/22 1020   BP: 97/53   Pulse: 88   Resp: 12   Temp: 36.1 C (97 F)   SpO2: 99%       Post pain: Patient not complaining of pain, continue current therapy      Mental Status:awake and alert     Respiratory Function: tolerating face mask    Cardiovascular: stable    Nausea/Vomiting: patient not complaining of nausea or vomiting    Hydration Status: adequate    Post assessment: no apparent anesthetic complications, no reportable events, and no evidence of recall    Signed by: Oneita Kras, CRNA  09/27/22 10:21 AM

## 2022-09-27 NOTE — Op Note (Signed)
Procedure Date: 09/27/2022    Patient Type: V    SURGEON: Cleotilde Neer. Leonor Liv, MD    ASSISTANT:  None.    PREOPERATIVE DIAGNOSES:  1.  Benign prostatic hypertrophy with obstruction.  2.  Bladder outlet obstruction.  3.  Bladder stones.    POSTOPERATIVE DIAGNOSES:  1.  Benign prostatic hypertrophy with obstruction.  2.  Bladder outlet obstruction.  3.  Bladder stones.    TITLE OF PROCEDURES:  1.  Cystourethroscopy.  2.  Urethral dilation.  3.  Cystolitholapaxy greater than 2.5 cm.  4.  Transurethral resection of prostate.    ANESTHESIA:  General.    INTRAVENOUS FLUIDS:  Crystalloid.    ESTIMATED BLOOD LOSS:  Minimal.    SPECIMEN:  Prostate tissue and bladder stones.    DRAINS:  A 22-French 30 mL, 3-way Foley catheter to continuous bladder irrigation.    COMPLICATIONS:  None.    INDICATIONS FOR PROCEDURE:  Mr. Buckles is a 71 year old male with past medical history significant for BPH  with obstruction, bladder outlet obstruction urinary retention and bladder   stones, totalling greater than 2.5 cm.  Due to these findings,   recommendation was made to proceed with the above procedures.  The risks,   details, benefits were discussed and all questions answered.  A conscious,   decision was made to proceed.    DESCRIPTION OF PROCEDURE:  After the proper operative and anesthesia   consents were obtained, Mr. Winemiller was taken to the operating room to undergo   the above procedure.  The patient was placed on the operating table in   supine position.  SCDs were placed on both lower extremities for DVT   prophylaxis.  The patient was given 1 gram of Rocephin perioperatively.    General anesthesia was induced.  LMA was placed.  The patient was placed in  dorsal lithotomy position with all pressure points padded.  The genitalia   were prepped and draped in standard sterile fashion.  A surgical pause was   performed.  Once the team was in agreement, a 22-French 30-degree rigid   cystoscope was passed in bladder without difficulty.  The  anterior urethra   was normal.  The prostatic urethra showed lateral lobar obstruction in the   midline with an elevated median bar.  Upon entering the bladder,   pancystoscopy was performed showing both left and right ureteral orifices   in orthotopic position with efflux of clear urine.  There was diffuse   trabeculation with cellules.  There were several bladder stones with the   largest measuring near 2.5 cm.  At this point, the cystoscope was removed   and an attempt was made to pass a 26-French continuous flow resectoscope   into the bladder; however, there was resistance met proximally.  At this   point, the resectoscope was removed and using Sissy Hoff sounds, the urethra  was dilated from a 20-French to a 30-French without difficulty.  At this   point, it did accommodate the resectoscope easily, which was passed in the   bladder under direct visualization and the direct visualization obturator   was exchanged for a laser bridge.  Using a 550 micron holmium laser fiber,   extensive lithotripsy was performed on all bladder stones until they had   all been fragmented adequately.  An Ellik evacuator was used to remove the   stone debris.  At this point, the laser bridge was exchanged for a bipolar   resection loop with  a saline irrigant.  Attention was first turned to the   median bar, which was resected down to the level of the bladder neck   between the 5 and 7 o'clock position.  The 5 to 7 o'clock channel was then   extended from bladder neck to just proximal to verumontanum.    Attention was turned to the left lateral lobe between the 1 and 5 o'clock   position, which was resected from bladder neck to just proximal to   verumontanum down to the level of the prostatic capsule.  The mirror image   was then repeated on the right lateral lobe between the 7 and 11 o'clock   position.  There was moderate anterior tissue between the 11 and 1 o'clock   position, which was resected also.  An Ellik evacuator was used  to remove   the prostatic tissue, which was sent along with a bladder stone debris for   specimen.  Hemostasis was achieved with electrocautery.  At this point, a   repeat cystoscopy was performed showing no debris within the bladder.  The   trigone was intact and without injury.  The prostatic urethra underwent   hemostasis with electrocautery.  At this point, with the scope at the level  of the verumontanum, there was a wide open circumferential channel   extending into the bladder with no evidence of active bleeding and no   obstruction.  At this point, Mr. Obrecht bladder was left full and the scope   removed showing an intact and functional external urethral sphincter.  Once  the scope was removed, pressure was applied on the bladder and there was an  excellent stream.  A 22-French 30 mL, 3-way Foley catheter was placed with   return of clear to light pink urine.  The balloon was inflated with 30 mL   sterile water and left to gravity drainage.  Continuous bladder irrigation   was initiated with normal saline with excellent drainage.  The Foley   catheter was secured to Mr. Dooner's leg and left to gravity drainage.  The   procedure was completed without complication.  Surgical counts correct at   the end the case.  Mr. Grey tolerated the procedure well.  The patient was   brought out of anesthesia and taken to the recovery room in stable and   satisfactory condition.  Mr. Hyland will be admitted to urology service for   postoperative care.      D: 09/27/2022 10:41 AM by Cleotilde Neer. Leonor Liv, MD ([id number])  T: 09/27/2022 11:21 AM by ct [id number] (Conf: [id number]) (Doc ID:   [id number])

## 2022-09-27 NOTE — Brief Op Note (Signed)
BRIEF OP NOTE    Date Time: 09/27/22 10:08 AM    Patient Name:   [name] [name], [name] [name] (MRN: [id number])    Date of Operation:   09/27/2022     Providers Performing:   Surgeon(s) and Role:     * Miquel Dunn, MD - Primary    Surgical First Assistant(s):   None    Operative Procedure:   CYSTOSCOPY, URETHRAL DILATION, CYSTOLITHOLAPAXY > 2.5CM, TURP    Preoperative Diagnosis:   Pre-Op Diagnosis Codes:     * Benign prostatic hyperplasia with urinary obstruction [N40.1, N13.8]  Bladder Stones  Postoperative Diagnosis:   Post-Op Diagnosis Codes:     * Benign prostatic hyperplasia with urinary obstruction [N40.1, N13.8]  Bladder Stones  Findings:   As dictated    Anesthesia:   General    Estimated Blood Loss:    5 mL    Implants:   * No implants in log *        Drains:   Drains: 52F 30cc 3 way foley to CBI            Specimens:     ID Type Source Tests Collected by Time Destination   A : prostate tissue and bladder stone Tissue Prostate SURGICAL PATHOLOGY Miquel Dunn, MD 09/27/2022 1004           Complications:   None     Signed by: Miquel Dunn, MD                                                                           [facility]

## 2022-09-28 ENCOUNTER — Encounter: Payer: Self-pay | Admitting: Urology

## 2022-09-28 DIAGNOSIS — N401 Enlarged prostate with lower urinary tract symptoms: Secondary | ICD-10-CM

## 2022-09-28 DIAGNOSIS — N138 Other obstructive and reflux uropathy: Secondary | ICD-10-CM

## 2022-09-28 LAB — CBC
Absolute NRBC: 0 10*3/uL (ref 0.00–0.00)
Hematocrit: 34.4 % — ABNORMAL LOW (ref 37.6–49.6)
Hgb: 10.7 g/dL — ABNORMAL LOW (ref 12.5–17.1)
MCH: 27.8 pg (ref 25.1–33.5)
MCHC: 31.1 g/dL — ABNORMAL LOW (ref 31.5–35.8)
MCV: 89.4 fL (ref 78.0–96.0)
MPV: 9.2 fL (ref 8.9–12.5)
Nucleated RBC: 0 /100 WBC (ref 0.0–0.0)
Platelets: 279 10*3/uL (ref 142–346)
RBC: 3.85 10*6/uL — ABNORMAL LOW (ref 4.20–5.90)
RDW: 13 % (ref 11–15)
WBC: 9.01 10*3/uL (ref 3.10–9.50)

## 2022-09-28 LAB — BASIC METABOLIC PANEL
Anion Gap: 9 (ref 5.0–15.0)
BUN: 11 mg/dL (ref 9.0–28.0)
CO2: 21 mEq/L (ref 17–29)
Calcium: 9.1 mg/dL (ref 7.9–10.2)
Chloride: 114 mEq/L — ABNORMAL HIGH (ref 99–111)
Creatinine: 0.8 mg/dL (ref 0.5–1.5)
Glucose: 90 mg/dL (ref 70–100)
Potassium: 5.4 mEq/L — ABNORMAL HIGH (ref 3.5–5.3)
Sodium: 144 mEq/L (ref 135–145)
eGFR: >60 mL/min/{1.73_m2} (ref >=60)

## 2022-09-28 MED ORDER — ACETAMINOPHEN 325 MG PO TABS
650.0000 mg | ORAL_TABLET | ORAL | Status: DC | PRN
Start: 2022-09-28 — End: 2023-02-09

## 2022-09-28 MED ORDER — CEFDINIR 300 MG PO CAPS
300.0000 mg | ORAL_CAPSULE | Freq: Two times a day (BID) | ORAL | 0 refills | Status: AC
Start: 2022-09-28 — End: 2022-10-03

## 2022-09-28 MED ORDER — DSS 100 MG PO CAPS
100.0000 mg | ORAL_CAPSULE | Freq: Two times a day (BID) | ORAL | 1 refills | Status: DC
Start: 2022-09-28 — End: 2023-02-09

## 2022-09-28 NOTE — Plan of Care (Signed)
NURSING PROGRESS NOTE    Patient Name: [name] [name],[name] [name]   Code Status: F  Admit Date: 09/27/2022 9:37 AM  Date Time: 09/28/22 9:37 AM   Hospital Day #: 0    Date of Admission:   09/27/2022    Reason for Admission:   Benign prostatic hyperplasia with urinary obstruction [N40.1, N13.8]    Procedures:     Procedure(s):  CYSTOSCOPY, TRANSURETHRAL RESECTION OF PROSTATE , TURP, CYSTOLITHOPLAXY    1 Day Post-Op  -------------------          Nursing Note:     Vitals: Stable,   Neuro: A&Ox4, vietnamese speaking. Slow to respond  Respiratory: WDL, RA  Cardiac: no tele, WDL  GI: R diet, denies nausea  GU: CBI clamped. Foley in place  Skin: Intact  Wound 09/27/22 Surgical Incision Penis Other (Comment) n/a (Active)   Date First Assessed/Time First Assessed: 09/27/22 1022   Wound Type: Surgical Incision  Location: Penis  Wound Location Orientation: Other (Comment)  Wound Description (Comments): n/a      Assessments 09/27/2022 10:15 AM 09/28/2022  9:00 AM   Site Description Other (Comment) Clean;Dry;Intact       No associated orders.       Wound 09/27/22 Surgical Incision Scrotum Other (Comment) n/a (Active)   Date First Assessed/Time First Assessed: 09/27/22 1022   Wound Type: Surgical Incision  Location: Scrotum  Wound Location Orientation: Other (Comment)  Wound Description (Comments): n/a      No assessment data to display       No associated orders.     Musculoskeletal: can move all ext w/o difficulty  Pain: denies pain  Mobility: x1 assist  walker  LDA/Drips: n/a  Patient Lines/Drains/Airways Status       Active Lines, Drains and Airways       Name Placement date Placement time Site Days    Peripheral IV 09/27/22 20 G Right Antecubital 09/27/22  0721  Antecubital  1    Urethral Catheter Straight-tip 16 Fr. 08/27/22  0100  Straight-tip  32    Urethral Catheter Triple-lumen;Latex 22 Fr. 09/27/22  0950  Triple-lumen;Latex  less than 1                  Fall Score/Safety: bed locked in lowest position, fall mat in place, bedside  table/call bell/personal belongings within reach    Shift Events:   Plan:to be discharged  Problem: Moderate/High Fall Risk Score >5  Goal: Patient will remain free of falls  Outcome: Progressing  Flowsheets (Taken 09/28/2022 0900)  High (Greater than 13):   HIGH-Consider use of low bed   HIGH-Initiate use of floor mats as appropriate     Problem: Safety  Goal: Patient will be free from injury during hospitalization  Outcome: Progressing  Flowsheets (Taken 09/28/2022 0935)  Patient will be free from injury during hospitalization:   Assess patient's risk for falls and implement fall prevention plan of care per policy   Provide and maintain safe environment   Use appropriate transfer methods   Ensure appropriate safety devices are available at the bedside  Goal: Patient will be free from infection during hospitalization  Outcome: Progressing  Flowsheets (Taken 09/28/2022 0935)  Free from Infection during hospitalization:   Assess and monitor for signs and symptoms of infection   Monitor lab/diagnostic results     Problem: Pain  Goal: Pain at adequate level as identified by patient  Outcome: Progressing  Flowsheets (Taken 09/28/2022 0935)  Pain at adequate level as identified by  patient:   Identify patient comfort function goal   Assess for risk of opioid induced respiratory depression, including snoring/sleep apnea. Alert healthcare team of risk factors identified.   Assess pain on admission, during daily assessment and/or before any "as needed" intervention(s)   Reassess pain within 30-60 minutes of any procedure/intervention, per Pain Assessment, Intervention, Reassessment (AIR) Cycle     Problem: Side Effects from Pain Analgesia  Goal: Patient will experience minimal side effects of analgesic therapy  Outcome: Progressing  Flowsheets (Taken 09/28/2022 0935)  Patient will experience minimal side effects of analgesic therapy:   Monitor/assess patient's respiratory status (RR depth, effort, breath sounds)   Assess for  changes in cognitive function   Prevent/manage side effects per LIP orders (i.e. nausea, vomiting, pruritus, constipation, urinary retention, etc.)     Problem: SCIP  Goal: SCIP measures are followed  Outcome: Progressing  Flowsheets (Taken 09/28/2022 0935)  SCIP measures are followed:   Administer antibiotics as ordered   Provide VTE prophylaxis within 24 hours of anesthesia end time (Note: SCD to lower extremities)     Problem: Inadequate Airway Clearance  Goal: Patent Airway maintained  Outcome: Progressing  Flowsheets (Taken 09/28/2022 0935)  Patent airway maintained:   Position patient for maximum ventilatory efficiency   Provide adequate fluid intake to liquefy secretions     Problem: Infection Prevention  Goal: Free from infection  Outcome: Progressing  Flowsheets (Taken 09/28/2022 0935)  Free from infection:   Monitor/assess vital signs   Encourage/assist patient to turn, cough and perform deep breathing every 2 hours     Problem: Bladder/Voiding  Goal: Remains continent  Outcome: Progressing  Flowsheets (Taken 09/28/2022 0935)  Remains continent:   Encourage patient to empty bladder at regular intervals   Monitor intake and output  Goal: Perineal skin integrity is maintained or improved  Outcome: Progressing  Flowsheets (Taken 09/28/2022 0935)  Perineal skin integrity is maintained or improved:   Keep intact skin clean and dry   Apply urinary containment device as appropriate and/or per order   Use protective skin barriers to decrease potential skin breakdown  Goal: Free from infection  Outcome: Progressing  Flowsheets (Taken 09/28/2022 0935)  Free from infection: Monitor/assess for signs and symptoms of infection

## 2022-09-28 NOTE — UM Notes (Signed)
09/27/22 0734  ADMIT TO OBSERVATION (OUTPATIENT WITH OBSERVATION SERVICES)  Once       Comments:  CCW or ST8    09/27/22 0733     Initial review  Unit: Short stay/ [facility]    PREOPERATIVE DIAGNOSES:  1.  Benign prostatic hypertrophy with obstruction.  2.  Bladder outlet obstruction.  3.  Bladder stones.     POSTOPERATIVE DIAGNOSES:  1.  Benign prostatic hypertrophy with obstruction.  2.  Bladder outlet obstruction.  3.  Bladder stones.     TITLE OF PROCEDURES:  1.  Cystourethroscopy.  2.  Urethral dilation.  3.  Cystolitholapaxy greater than 2.5 cm.  4.  Transurethral resection of prostate.     Vitals Value Taken Time   BP 149/79 09/27/22 1220   Temp 36.1 C (97 F) 09/27/22 1220   Pulse 86 09/27/22 1220   Resp 12 09/27/22 1220   SpO2 95 % 09/27/22 1220       Pollyann Kennedy, RN, BSN CNRN  UR Case Manager   Utilization Review  [facility]   [address]  [address]  [place], [place] [address]  Phone : [phone]   [place] [phone]  Fax: [phone]    [email or web address]

## 2022-09-28 NOTE — Progress Notes (Signed)
Pt Vine[facility]'d home at this time w all his belongings. Foley Discharge education provided to family members and the patient. Pt and family member agreed to the discharge education provided. Meds to be picked up at pt's local pharmacy. All questions answered. No complains of pain or no signs of distress noted at the time of discharge. IV removed prior to discharge.

## 2022-09-28 NOTE — Final Progress Note (DC Note for stay less than 48 (Signed)
FINAL PROGRESS NOTE  [facility] [phone]  If unreachable/after hours call [phone]    Date Time: 09/28/22 8:38 AM  Patient Name: [name] [name]      Assessment:   [name] [name] is a 71 y.o. male who is POD #1 s/p TURP and cystolitholapaxy . Clinically stable for discharge.    Renal: Cr 0.8 (stable), UOP 975 cc net on CBI  Heme: Hct 34<37  ID: WBC 9<10.7, afebrile       Plan:   Maintain Foley to gravity drainage.  He will discharge with Foley in place.  Leg bag education.   He will remove the Foley at home on Thursday morning 11/2. Written instructions provided and reviewed.  Monitor urine output.   Regular diet - six, small meals.   Continue bowel regimen.   Discharge with cefdinir antibiotic twice daily for 5 days.     Outpatient follow-up with Dr. Leonor Liv as scheduled.  Discharge home today.       The assessment and plan were discussed with Kevan Rosebush, MD and formulated together as documented.    Subjective:   Pain is well controlled. Ambulating without difficulty. Voiding via foley catheter. Tolerating regular diet. -NV, +Flatus. Denies fevers.    Medications:     Current Facility-Administered Medications   Medication Dose Route Frequency    docusate sodium  100 mg Oral BID       Physical Exam:     Vitals:    09/28/22 0300   BP: 130/73   Pulse: 66   Resp: 16   Temp: 97 F (36.1 C)   SpO2: 97%       Intake and Output Summary (Last 24 hours) at Date Time    Intake/Output Summary (Last 24 hours) at 09/28/2022 0838  Last data filed at 09/28/2022 0800  Gross per 24 hour   Intake 3649.58 ml   Output 980 ml   Net 2669.58 ml       Physical Exam:   Constitutional: Vital signs reviewed. Well appearing. Nontoxic male in NAD. AF/VS reviewed.   Head: Normocephalic, atraumatic.   ENT: Oropharynx clear. Moist mucus membranes.  Neck: Supple. Normal range of motion. Trachea midline.   Respiratory/Chest:  No respiratory distress. Nl effort.   Cardiovascular: Regular rate  Abdomen: Soft, surgical sites C/D/I, APTT, non distended. No  guarding, masses. No acute abdomen  GU: Foley intact draining clear straw urine off CBI, no suprapubic TTP  Musculoskeletal: Normal ROM. No edema or cyanosis Bilat.   Neurological: No focal motor deficits by observation. Speech normal.   Skin: Warm and dry. No rashes noted.   Psychiatric: Awake and alert. Normal affect.     Labs:     Results       Procedure Component Value Units Date/Time    Basic Metabolic Panel [[id number]]  (Abnormal) Collected: 09/28/22 0414    Specimen: Blood Updated: 09/28/22 0652     Glucose 90 mg/dL      BUN 40.3 mg/dL      Creatinine 0.8 mg/dL      Calcium 9.1 mg/dL      Sodium 474 mEq/L      Potassium 5.4 mEq/L      Chloride 114 mEq/L      CO2 21 mEq/L      Anion Gap 9.0     eGFR >60.0 mL/min/1.73 m2     CBC without differential [[id number]]  (Abnormal) Collected: 09/28/22 0414    Specimen: Blood Updated: 09/28/22 0537     WBC 9.01  x10 3/uL      Hgb 10.7 g/dL      Hematocrit 57.8 %      Platelets 279 x10 3/uL      RBC 3.85 x10 6/uL      MCV 89.4 fL      MCH 27.8 pg      MCHC 31.1 g/dL      RDW 13 %      MPV 9.2 fL      Nucleated RBC 0.0 /100 WBC      Absolute NRBC 0.00 x10 3/uL     CBC without differential [[id number]]  (Abnormal) Collected: 09/27/22 1054    Specimen: Blood Updated: 09/27/22 1132     WBC 10.77 x10 3/uL      Hgb 11.7 g/dL      Hematocrit 41.3 %      Platelets 287 x10 3/uL      RBC 4.23 x10 6/uL      MCV 89.6 fL      MCH 27.7 pg      MCHC 30.9 g/dL      RDW 13 %      MPV 8.9 fL      Nucleated RBC 0.0 /100 WBC      Absolute NRBC 0.00 x10 3/uL               Rads:   No results found.     Signed by: Briscoe Burns, PA  Herold Harms, PA-C  [facility] [phone]  [facility] [phone]   IF unreachable/after hours call [phone]

## 2022-09-30 LAB — LAB USE ONLY - HISTORICAL SURGICAL PATHOLOGY

## 2022-10-04 ENCOUNTER — Ambulatory Visit (INDEPENDENT_AMBULATORY_CARE_PROVIDER_SITE_OTHER): Payer: Medicare Other | Admitting: Student in an Organized Health Care Education/Training Program

## 2023-02-09 ENCOUNTER — Encounter (INDEPENDENT_AMBULATORY_CARE_PROVIDER_SITE_OTHER): Payer: Self-pay | Admitting: Family Medicine

## 2023-02-09 ENCOUNTER — Ambulatory Visit (INDEPENDENT_AMBULATORY_CARE_PROVIDER_SITE_OTHER): Payer: Medicare Other | Admitting: Family Medicine

## 2023-02-09 VITALS — BP 152/78 | HR 83 | Temp 98.0°F | Resp 16 | Ht 62.0 in | Wt 83.6 lb

## 2023-02-09 DIAGNOSIS — R64 Cachexia: Secondary | ICD-10-CM

## 2023-02-09 DIAGNOSIS — Z8709 Personal history of other diseases of the respiratory system: Secondary | ICD-10-CM | POA: Insufficient documentation

## 2023-02-09 DIAGNOSIS — I77810 Thoracic aortic ectasia: Secondary | ICD-10-CM

## 2023-02-09 DIAGNOSIS — Z01818 Encounter for other preprocedural examination: Secondary | ICD-10-CM

## 2023-02-09 DIAGNOSIS — N401 Enlarged prostate with lower urinary tract symptoms: Secondary | ICD-10-CM

## 2023-02-09 DIAGNOSIS — I10 Essential (primary) hypertension: Secondary | ICD-10-CM | POA: Insufficient documentation

## 2023-02-09 DIAGNOSIS — N138 Other obstructive and reflux uropathy: Secondary | ICD-10-CM

## 2023-02-09 DIAGNOSIS — J439 Emphysema, unspecified: Secondary | ICD-10-CM

## 2023-02-09 DIAGNOSIS — D649 Anemia, unspecified: Secondary | ICD-10-CM

## 2023-02-09 LAB — ECG 12-LEAD
Atrial Rate: 89 {beats}/min
IHS MUSE NARRATIVE AND IMPRESSION: NORMAL
P Axis: 89 degrees
P-R Interval: 160 ms
Q-T Interval: 368 ms
QRS Duration: 78 ms
QTC Calculation (Bezet): 447 ms
R Axis: 87 degrees
T Axis: 86 degrees
Ventricular Rate: 89 {beats}/min

## 2023-02-09 MED ORDER — AMLODIPINE BESYLATE 2.5 MG PO TABS
2.5000 mg | ORAL_TABLET | Freq: Every day | ORAL | 3 refills | Status: DC
Start: 2023-02-09 — End: 2023-02-23

## 2023-02-09 NOTE — Progress Notes (Signed)
Have you seen any specialists/other providers since your last visit with [place]?    Yes . Ophthalmology    Health Maintenance Due   Topic Date Due    [place]  Never done    Statin Use  Never done    Colorectal Cancer Screening  Never done    Medicare Annual Wellness Visit  Never done    Shingrix Vaccine 50+ (1) Never done    Tetanus Ten-Year  Never done    Pneumonia Vaccine Age 74+ (1 - PCV) Never done    HEPATITIS C SCREENING  Never done    INFLUENZA VACCINE  Never done    COVID-19 Vaccine (3 - 2023-24 season) 07/30/2022

## 2023-02-09 NOTE — Progress Notes (Addendum)
[facility]                     Date of Exam: 02/09/2023         Patient ID: [name] [name] is a 72 y.o. male.  Attending Physician: Anson Fret, MD        Chief Complaint:    Chief Complaint   Patient presents with    Pre-op Exam     Both eyes Cataract Surgery  No Scheduled date  [facility]  [address]  [place], [place] [address]  [place] [phone]           Assessment / Plan:    1. Preoperative examination  - ECG 12 lead NSR with right atrial enlargement nonspecific ST-T wave changes.  Will refer to cardiology because of poor exercise tolerance.  - Referral to Pulmonology ([place]); Future  - Referral to Cardiology ([place]); Future    2. Anemia, unspecified type  -Possibly related with anemia of chronic disease will get updated labs.   - CBC and differential; Future  - IRON PROFILE; Future  - Ferritin; Future    3. 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 Primary hypertension  Chronic elevated blood pressure will start on amlodipine 2.5 mg once a day.    - amLODIPine (NORVASC) 2.5 MG tablet; Take 1 tablet (2.5 mg) by mouth daily  Dispense: 30 tablet; Refill: 3  - Referral to Cardiology ([place]); Future    5. Ascending aorta dilation  Incidental finding on prior CAT scan will get cardiology for further evaluation recommendations on next imaging test.  - Referral to Cardiology ([place]); Future    6. Pulmonary emphysema, unspecified emphysema type  Chronic discussed concerns with pulmonary emphysema.  Not on medications currently.  Patient is comfortable with room air will get updated x-rays with prior history of pneumothorax.  Will start referral to pulmonology.- X-ray chest PA and lateral; Future  - Referral to Pulmonology ([place]); Future    7. History of pneumothorax  We will get an updated chest x-ray.    8. Benign prostatic hyperplasia with urinary obstruction  Chronic status post TURP doing  well per patient.    Addendum: labs reviewed, stable. Chest xray no pneumothorax. Cardio cleared patient. Cleared for planned surgery.                       HPI:    Visit Type: Pre-operative Evaluation  Procedure: cataract surgery  Date of Surgery: TBD  Surgeon: Dr. Rae Mar  Fax Number (Required): [phone]  Chief Complaint: Preop examination establish care.   Prior Anesthesia: Patient reports no adverse reaction to anesthesia in the past.      Patient is here with son [name] [name].  Patient most from [place] 4 years ago, has not seen a PCP for several years.  Patient prefers son to translate in [place].    Patient has a history of emphysema/COPD.  He has been smoking for 60 years and quit smoking 6 months ago when he was hospitalized for COPD exacerbation.  He was initially admitted in the ICU.  And had complications of right-sided pneumothorax and had chest tube placed.  Patient was discharged home to follow-up with pulmonology but failed to make a follow-up appointment.    He had seen urology for urinary retention and was found to  have BPH and nephrolithiasis.  Patient had a cystoscopy and TURP.  Patient tolerated procedure without any issues.  And was discharged home.  Son reports that patient is not currently on any medications at this point in time.    Patient has not been smoking does get short of breath with exertion.  Does have intermittent wheezing at rest.  Not requiring any oxygen at rest currently.    Patient has an elevated blood pressure today.  His son reports that his blood pressure has always been high.  He is not on any medications currently. Has had incidental finding of aortic dilation from CT 07/2022.     Patient is underweight, son reports that patient does not have much of an appetite.  He is being fed and has food at home.  He usually eats half a meal at lunch and a full meal at dinner.     Functional Capacity (METS): consider highest level (>4 METS implies low cardiovascular risk  from surgery)  Exercise Tolerance: 2 met (i.e. slow walking )    Son is wanting to get the eye surgery done to help with patient's driving.  Patient had seen an eye doctor and was advised to have cataracts removed on both eyes.               Problem List:    Patient Active Problem List   Diagnosis    Bladder stone    Urinary retention    Benign prostatic hyperplasia with lower urinary tract symptoms    Right kidney stone    Ascending aorta dilation    History of smoking    BMI less than 19,adult    Abnormal weight    Pulmonary emphysema, unspecified emphysema type    Coronary artery calcification seen on CT scan    Benign prostatic hyperplasia with urinary obstruction    History of pneumothorax    Primary hypertension             Current Meds:    No outpatient medications have been marked as taking for the 02/09/23 encounter (Office Visit) with Valyncia Wiens, Yolanda Bonine, MD.          Allergies:    No Known Allergies          Past Surgical History:    Past Surgical History:   Procedure Laterality Date    CYSTOSCOPY, LITHOLAPAXY N/A 01/04/2020    Procedure: Juliet Rude;  Surgeon: Catarina Hartshorn, MD;  Location: [facility];  Service: Urology;  Laterality: N/A;  CYSTOLITHOLAPAXY    CYSTOSCOPY, TURP N/A 09/27/2022    Procedure: CYSTOSCOPY, TRANSURETHRAL RESECTION OF PROSTATE , TURP, CYSTOLITHOPLAXY;  Surgeon: Catarina Hartshorn, MD;  Location: [facility];  Service: Urology;  Laterality: N/A;    PROSTATE SURGERY  2019           Family History:    Family History   Problem Relation Age of Onset    No known problems Mother     No known problems Father            Social History:    Social History     Tobacco Use    Smoking status: Former     Packs/day: 0.25     Years: 50.00     Additional pack years: 0.00     Total pack years: 12.50     Types: Cigarettes     Quit date: 06/26/2022     Years since quitting: 0.6  Smokeless tobacco: Never   Vaping Use    Vaping Use: Never used   Substance Use Topics    Alcohol  use: Never    Drug use: Never           The following sections were reviewed this encounter by the provider:   Tobacco  Allergies  Meds  Problems  Med Hx  Surg Hx  Fam Hx             ROS:    Review of Systems   Constitutional:  Negative for chills and fever.   Cardiovascular:  Negative for chest pain.   Gastrointestinal:  Negative for abdominal pain, nausea and vomiting.                Vital Signs:    BP 152/78   Pulse 83   Temp 98 F (36.7 C) (Temporal)   Resp 16   Ht 1.575 m (5\' 2" )   Wt (!) 37.9 kg (83 lb 9.6 oz)   SpO2 94%   BMI 15.29 kg/m          Physical Exam:    Physical Exam  Constitutional:       Appearance: Normal appearance.   Cardiovascular:      Rate and Rhythm: Normal rate and regular rhythm.   Pulmonary:      Effort: Pulmonary effort is normal.      Breath sounds: Examination of the right-middle field reveals wheezing. Examination of the left-middle field reveals wheezing. Decreased breath sounds and wheezing present.   Neurological:      Mental Status: He is alert.                        Follow-up:    Return in about 2 weeks (around 02/23/2023) for Hypertension.         [address] R Rhoda Waldvogel III, MD

## 2023-02-10 ENCOUNTER — Encounter (INDEPENDENT_AMBULATORY_CARE_PROVIDER_SITE_OTHER): Payer: Self-pay | Admitting: Family Medicine

## 2023-02-14 ENCOUNTER — Ambulatory Visit (INDEPENDENT_AMBULATORY_CARE_PROVIDER_SITE_OTHER): Payer: Medicare Other | Admitting: Student in an Organized Health Care Education/Training Program

## 2023-02-16 ENCOUNTER — Ambulatory Visit (INDEPENDENT_AMBULATORY_CARE_PROVIDER_SITE_OTHER): Payer: Medicare Other | Admitting: Cardiovascular Disease

## 2023-02-16 ENCOUNTER — Other Ambulatory Visit (FREE_STANDING_LABORATORY_FACILITY): Payer: Medicare Other

## 2023-02-16 ENCOUNTER — Encounter (INDEPENDENT_AMBULATORY_CARE_PROVIDER_SITE_OTHER): Payer: Self-pay | Admitting: Cardiovascular Disease

## 2023-02-16 VITALS — BP 125/87 | HR 94 | Wt 85.0 lb

## 2023-02-16 DIAGNOSIS — D649 Anemia, unspecified: Secondary | ICD-10-CM

## 2023-02-16 DIAGNOSIS — I77810 Thoracic aortic ectasia: Secondary | ICD-10-CM

## 2023-02-16 DIAGNOSIS — Z01818 Encounter for other preprocedural examination: Secondary | ICD-10-CM

## 2023-02-16 DIAGNOSIS — I10 Essential (primary) hypertension: Secondary | ICD-10-CM

## 2023-02-16 DIAGNOSIS — R64 Cachexia: Secondary | ICD-10-CM

## 2023-02-16 DIAGNOSIS — J439 Emphysema, unspecified: Secondary | ICD-10-CM

## 2023-02-16 LAB — ECG 12-LEAD
P Axis: 87 degrees
P-R Interval: 160 ms
QRS Duration: 82 ms
QTC Calculation (Bezet): 456 ms
R Axis: 84 degrees
T Axis: 73 degrees
Ventricular Rate: 80 {beats}/min

## 2023-02-16 NOTE — Progress Notes (Signed)
[facility] OFFICE CONSULTATION    I had the pleasure of seeing [name] [name] today for cardiovascular evaluation. He is a pleasant 72 y.o. male with a history of COPD,.  Resolved pneumothorax ,coronary artery calcifications, and abnormal EKG who presents for cardiovascular evaluation. He can walk 30 min with 5 min breaks. He denies any chest pain. He can climb 1 fl of stairs slowly.Pt has not been eating well and doesn't get enough calories daily    PAST MEDICAL HISTORY: He has a past medical history of Bilateral cataracts (01/12/2023), Collapsed lung, Emphysema lung, and Enlarged prostate. He has a past surgical history that includes Prostate surgery (2019); CYSTOSCOPY, LITHOLAPAXY (N/A, 01/04/2020); and CYSTOSCOPY, TURP (N/A, 09/27/2022).    MEDICATIONS: He has a current medication list which includes the following prescription(s): amlodipine - Take 1 tablet (2.5 mg) by mouth daily.    ALLERGIES: No Known Allergies    FAMILY HISTORY: His family history includes No known problems in his father and mother.    SOCIAL HISTORY: He reports that he quit smoking about 7 months ago. His smoking use included cigarettes. He has a 12.50 pack-year smoking history. He has never used smokeless tobacco. He reports that he does not drink alcohol and does not use drugs.  60 pk yr hx  REVIEW OF SYSTEMS: All other systems reviewed and negative except as stated above.     PHYSICAL EXAMINATION  General Appearance:  A well-appearing male in no acute distress.    Vital Signs: BP 125/87 (BP Site: Left arm, Patient Position: Sitting, Cuff Size: Large)   Pulse 94   Wt (!) 38.6 kg (85 lb)   BMI 15.55 kg/m    HEENT: Sclera anicteric, conjunctiva without pallor, moist mucous membranes, normal dentition. No arcus.   Neck:  Supple without jugular venous distention. Thyroid nonpalpable. Normal carotid upstrokes without bruits.   Chest: Clear to auscultation bilaterally with good air movement and respiratory effort and no wheezes,  rales, or rhonchi   Cardiovascular: Normal S1 and physiologically split S2 without murmurs, gallops or rub. PMI of normal size and nondisplaced.   Abdomen: Soft, nontender, nondistended, with normoactive bowel sounds. No organomegaly.  No pulsatile masses, or bruits.   Extremities: Warm without edema, clubbing, or cyanosis. All peripheral pulses are full and equal.   Skin: No rash, xanthoma or xanthelasma.   Neuro: Alert and oriented x3. Grossly intact. Strength is symmetrical. Normal mood and affect.     ECG: Sinus rhythm right atrial enlargement.    LABS: No new labs      IMPRESSION/RECOMMENDATIONS: [name] [name] is a 72 y.o. male with COPD and abnormal EKG presents for cardiac evaluation  Right atrial enlargement-right likely related to underlying COPD and moderate pulmonary hypertension will evaluate with 2D echo  COPD-currently on medical therapy  Cachexia- increase protein, nutrition consult.   Abd aorta dilation- 3.4 cm , repeat US in 1 yr.  From cardiac standpoint he is at acceptable CV risk for noncardiac surgery.    Televisit after echo        Signed by: Lauree Chandler, MD, MD [facility]           This note was generated by the Epic EMR system/ Dragon speech recognition and may contain inherent errors or omissions not intended by the user. Grammatical errors, random word insertions, deletions and pronoun errors  are occasional consequences of this technology due to software limitations. Not all errors are caught or corrected. If there are questions or  concerns about the content of this note or information contained within the body of this dictation they should be addressed directly with the author for clarification. [place]

## 2023-02-17 LAB — CBC AND DIFFERENTIAL
Absolute NRBC: 0 10*3/uL (ref 0.00–0.00)
Basophils Absolute Automated: 0.1 10*3/uL — ABNORMAL HIGH (ref 0.00–0.08)
Basophils Automated: 1.6 %
Eosinophils Absolute Automated: 0.25 10*3/uL (ref 0.00–0.44)
Eosinophils Automated: 4 %
Hematocrit: 43.7 % (ref 37.6–49.6)
Hgb: 13.2 g/dL (ref 12.5–17.1)
Immature Granulocytes Absolute: 0.03 10*3/uL (ref 0.00–0.07)
Immature Granulocytes: 0.5 %
Instrument Absolute Neutrophil Count: 3.59 10*3/uL (ref 1.10–6.33)
Lymphocytes Absolute Automated: 1.78 10*3/uL (ref 0.42–3.22)
Lymphocytes Automated: 28.3 %
MCH: 27.6 pg (ref 25.1–33.5)
MCHC: 30.2 g/dL — ABNORMAL LOW (ref 31.5–35.8)
MCV: 91.2 fL (ref 78.0–96.0)
MPV: 9.8 fL (ref 8.9–12.5)
Monocytes Absolute Automated: 0.53 10*3/uL (ref 0.21–0.85)
Monocytes: 8.4 %
Neutrophils Absolute: 3.59 10*3/uL (ref 1.10–6.33)
Neutrophils: 57.2 %
Nucleated RBC: 0 /100 WBC (ref 0.0–0.0)
Platelets: 296 10*3/uL (ref 142–346)
RBC: 4.79 10*6/uL (ref 4.20–5.90)
RDW: 13 % (ref 11–15)
WBC: 6.28 10*3/uL (ref 3.10–9.50)

## 2023-02-17 LAB — IRON PROFILE
Iron Saturation: 40 % (ref 15–50)
Iron: 100 ug/dL (ref 41–168)
TIBC: 252 ug/dL — ABNORMAL LOW (ref 261–462)
UIBC: 152 ug/dL (ref 126–382)

## 2023-02-17 LAB — HEMOLYSIS INDEX: Hemolysis Index: 13 Index (ref 0–24)

## 2023-02-17 LAB — PREALBUMIN: Prealbumin: 25.3 mg/dL (ref 18.0–45.0)

## 2023-02-17 LAB — FERRITIN: Ferritin: 100.1 ng/mL (ref 21.80–274.70)

## 2023-02-22 ENCOUNTER — Ambulatory Visit
Admission: RE | Admit: 2023-02-22 | Discharge: 2023-02-22 | Disposition: A | Discharge status: Home / Self Care | Payer: Medicare Other | Source: Ambulatory Visit | Attending: Family Medicine | Admitting: Family Medicine

## 2023-02-22 DIAGNOSIS — J439 Emphysema, unspecified: Secondary | ICD-10-CM | POA: Insufficient documentation

## 2023-02-23 ENCOUNTER — Encounter (INDEPENDENT_AMBULATORY_CARE_PROVIDER_SITE_OTHER): Payer: Self-pay | Admitting: Family Medicine

## 2023-02-23 ENCOUNTER — Ambulatory Visit (INDEPENDENT_AMBULATORY_CARE_PROVIDER_SITE_OTHER): Payer: Medicare Other | Admitting: Family Medicine

## 2023-02-23 VITALS — BP 121/82 | HR 90 | Temp 98.8°F | Resp 16 | Ht 62.0 in | Wt 85.6 lb

## 2023-02-23 DIAGNOSIS — R64 Cachexia: Secondary | ICD-10-CM

## 2023-02-23 DIAGNOSIS — J439 Emphysema, unspecified: Secondary | ICD-10-CM

## 2023-02-23 DIAGNOSIS — I10 Essential (primary) hypertension: Secondary | ICD-10-CM

## 2023-02-23 MED ORDER — AMLODIPINE BESYLATE 2.5 MG PO TABS
2.5000 mg | ORAL_TABLET | Freq: Every day | ORAL | 1 refills | Status: AC
Start: 2023-02-23 — End: ?

## 2023-02-23 NOTE — Progress Notes (Signed)
[facility]                       Date of Exam: 02/23/2023         Patient ID: [name] [name] is a 72 y.o. male.  Attending Physician: [name] Fret, MD        Chief Complaint:    Chief Complaint   Patient presents with    Hypertension     Follow up         Assessment / Plan :    1. Primary hypertension  Chronic, stable. Refills given.  - amLODIPine (NORVASC) 2.5 MG tablet; Take 1 tablet (2.5 mg) by mouth daily  Dispense: 90 tablet; Refill: 1    2. Cachexia  Chronic, possibly related with COPD. Will refer to nutritionist.     3. Giant bullous emphysema  Chronic, no change in symptoms. Monitor for now. Keep appointment with pulmonology.                 HPI:    HPI  He is here with [name] [name] his son.     Cachexia  Has been given Ensure and having a breakfast bar in the morning. Has gained 2lbs since last visit.     Hypertension  Taking medications regularly. No side effects with medications. Had seen cardiology, no concerns for cataract procedure.     COPD/Emphysema  Notes that breathing is the same. No cough and chest pain. Pulse ox is stable. Some dyspnea with activity.                     The following sections were reviewed this encounter by the provider:   Tobacco  Allergies  Meds  Problems  Med Hx  Surg Hx  Fam Hx             ROS:    Review of Systems   Constitutional:  Negative for chills and fever.   Cardiovascular:  Negative for chest pain.                Vital Signs:    BP 121/82 (BP Site: Left arm, Patient Position: Sitting, Cuff Size: Small)   Pulse 90   Temp 98.8 F (37.1 C) (Temporal)   Resp 16   Ht 1.575 m (5\' 2" )   Wt (!) 38.8 kg (85 lb 9.6 oz)   SpO2 96%   BMI 15.66 kg/m          Physical Exam:    Physical Exam  Constitutional:       Appearance: Normal appearance.   Cardiovascular:      Rate and Rhythm: Normal rate and regular rhythm.   Pulmonary:      Effort: No respiratory distress.      Breath sounds: No wheezing.   Neurological:      Mental Status: He is  alert. Mental status is at baseline.                        Follow-up:    Follow up in May.         [address] R [name] Hoffmann III, MD

## 2023-02-23 NOTE — Progress Notes (Signed)
Have you seen any specialists/other providers since your last visit with [place]?    Yes. Cardiologist    Health Maintenance Due   Topic Date Due    Statin Use  Never done    Colorectal Cancer Screening  Never done    Medicare Annual Wellness Visit  Never done    Shingrix Vaccine 50+ (1) Never done    Tetanus Ten-Year  Never done    Pneumonia Vaccine Age 74+ (1 of 2 - PCV) Never done    HEPATITIS C SCREENING  Never done    INFLUENZA VACCINE  Never done    COVID-19 Vaccine (3 - 2023-24 season) 07/30/2022

## 2023-03-10 ENCOUNTER — Other Ambulatory Visit (INDEPENDENT_AMBULATORY_CARE_PROVIDER_SITE_OTHER): Payer: Self-pay | Admitting: Family Medicine

## 2023-03-10 DIAGNOSIS — I10 Essential (primary) hypertension: Secondary | ICD-10-CM

## 2023-04-18 LAB — ECG 12-LEAD
Atrial Rate: 80 {beats}/min
IHS MUSE NARRATIVE AND IMPRESSION: NORMAL
Q-T Interval: 396 ms

## 2023-04-26 ENCOUNTER — Ambulatory Visit (INDEPENDENT_AMBULATORY_CARE_PROVIDER_SITE_OTHER): Payer: Medicare Other | Admitting: Family Medicine

## 2023-05-07 ENCOUNTER — Encounter (INDEPENDENT_AMBULATORY_CARE_PROVIDER_SITE_OTHER): Payer: Self-pay

## 2023-05-09 ENCOUNTER — Ambulatory Visit: Payer: Medicare Other | Admitting: Pulmonary Disease

## 2023-05-24 ENCOUNTER — Encounter (INDEPENDENT_AMBULATORY_CARE_PROVIDER_SITE_OTHER): Payer: Self-pay | Admitting: Family Medicine

## 2023-05-24 ENCOUNTER — Ambulatory Visit (INDEPENDENT_AMBULATORY_CARE_PROVIDER_SITE_OTHER): Payer: Medicare Other | Admitting: Family Medicine

## 2023-05-24 VITALS — BP 122/78 | HR 92 | Temp 97.8°F | Ht 63.0 in | Wt 84.0 lb

## 2023-05-24 DIAGNOSIS — R64 Cachexia: Secondary | ICD-10-CM

## 2023-05-24 DIAGNOSIS — I10 Essential (primary) hypertension: Secondary | ICD-10-CM

## 2023-05-24 DIAGNOSIS — J439 Emphysema, unspecified: Secondary | ICD-10-CM

## 2023-05-24 DIAGNOSIS — H2513 Age-related nuclear cataract, bilateral: Secondary | ICD-10-CM

## 2023-05-24 NOTE — Progress Notes (Signed)
[facility]                       Date of Exam: 05/24/2023         Patient ID: [name] [name] is a 72 y.o. male.  Attending Physician: Oswaldo Done, MD        Chief Complaint:    Chief Complaint   Patient presents with    Hypertension         Assessment / Plan :    1. Cachexia  Chronic 1 pound weight loss will continue to monitor at home.  Encouraged to increase caloric intake discussed with patient.  Son will try to reschedule appointment with nutritionist.    2. Giant bullous emphysema  Chronic, not on any medications right now.  Pulse ox was stable.  Encouraged the patient to schedule with pulmonology.  Son will try to reschedule this.    3. Primary hypertension  Chronic blood pressure better on recheck continue with amlodipine 2.5 mg once a day.    4. 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  Scheduled for surgery on the left eye in the next few weeks.                    HPI:    HPI  Patient is here for follow up. He is here by himself. [name] his son is on the phone today.    Patient has lost 1 pound since his last office visit.  Patient had an appointment with a nutritionist but this coincided with his cataract surgery so they need to reschedule another appointment.  His son has been offering him food, however, patient is not consuming them consistently.    Patient had cataract surgery on the right eye completed this is healed well and is doing good.  He will be getting the left eye cataract surgery soon.    Patient has a history of hypertension his blood pressures are stable today.  Has been taking amlodipine regularly no dizziness or lightheadedness.  They have not been checking blood pressures at home.    Patient has history of COPD and.  He reports that his breathing is fine right now.  He has multiple pulmonary blebs.  No chest pains currently denies any shortness of breath.  He had an appointment with the pulmonologist but failed to  keep this appointment.  Son reports that they have a newborn and balancing work and getting his father to medical appointments has been difficult.                    The following sections were reviewed this encounter by the provider:            ROS:    Review of Systems   Constitutional:  Negative for chills and fever.   Cardiovascular:  Negative for chest pain.   Gastrointestinal:  Negative for nausea and vomiting.                Vital Signs:    BP 122/78   Pulse 92   Temp 97.8 F (36.6 C) (Oral)   Ht 1.6 m (5\' 3" )   Wt (!) 38.1 kg (84 lb)   SpO2 97%   BMI 14.88 kg/m          Physical Exam:    Physical Exam  Constitutional:       General:  He is not in acute distress.  Cardiovascular:      Rate and Rhythm: Normal rate and regular rhythm.   Pulmonary:      Effort: Pulmonary effort is normal. No respiratory distress.      Breath sounds: Normal breath sounds.   Neurological:      Mental Status: He is alert and oriented to person, place, and time.                        Follow-up:    Return in about 3 months (around 08/24/2023) for weight check.         [address] R Keiko Myricks III, MD

## 2023-06-06 ENCOUNTER — Encounter (INDEPENDENT_AMBULATORY_CARE_PROVIDER_SITE_OTHER): Payer: Self-pay

## 2023-07-07 ENCOUNTER — Encounter (INDEPENDENT_AMBULATORY_CARE_PROVIDER_SITE_OTHER): Payer: Self-pay

## 2023-08-07 ENCOUNTER — Encounter (INDEPENDENT_AMBULATORY_CARE_PROVIDER_SITE_OTHER): Payer: Self-pay

## 2023-09-01 ENCOUNTER — Ambulatory Visit (INDEPENDENT_AMBULATORY_CARE_PROVIDER_SITE_OTHER): Payer: Medicare Other | Admitting: Student in an Organized Health Care Education/Training Program

## 2023-09-01 ENCOUNTER — Inpatient Hospital Stay
Admission: EM | Admit: 2023-09-01 | Discharge: 2023-09-08 | DRG: 190 | Disposition: A | Discharge status: Home Health | Payer: Medicare Other | Attending: Student in an Organized Health Care Education/Training Program | Admitting: Student in an Organized Health Care Education/Training Program

## 2023-09-01 ENCOUNTER — Emergency Department: Payer: Medicare Other

## 2023-09-01 ENCOUNTER — Encounter (INDEPENDENT_AMBULATORY_CARE_PROVIDER_SITE_OTHER): Payer: Self-pay | Admitting: Student in an Organized Health Care Education/Training Program

## 2023-09-01 ENCOUNTER — Emergency Department
Admission: EM | Admit: 2023-09-01 | Discharge: 2023-09-01 | Disposition: A | Discharge status: Other Acute Inpatient Hospital | Payer: Medicare Other | Attending: Internal Medicine | Admitting: Internal Medicine

## 2023-09-01 VITALS — BP 142/91 | HR 132 | Temp 98.0°F | Resp 20 | Ht 63.0 in | Wt 81.5 lb

## 2023-09-01 DIAGNOSIS — J9383 Other pneumothorax: Secondary | ICD-10-CM | POA: Insufficient documentation

## 2023-09-01 DIAGNOSIS — N200 Calculus of kidney: Secondary | ICD-10-CM | POA: Diagnosis present

## 2023-09-01 DIAGNOSIS — J939 Pneumothorax, unspecified: Secondary | ICD-10-CM | POA: Diagnosis present

## 2023-09-01 DIAGNOSIS — R64 Cachexia: Secondary | ICD-10-CM

## 2023-09-01 DIAGNOSIS — Z79899 Other long term (current) drug therapy: Secondary | ICD-10-CM

## 2023-09-01 DIAGNOSIS — J439 Emphysema, unspecified: Secondary | ICD-10-CM | POA: Insufficient documentation

## 2023-09-01 DIAGNOSIS — I472 Ventricular tachycardia, unspecified: Secondary | ICD-10-CM | POA: Diagnosis not present

## 2023-09-01 DIAGNOSIS — N401 Enlarged prostate with lower urinary tract symptoms: Secondary | ICD-10-CM

## 2023-09-01 DIAGNOSIS — I2510 Atherosclerotic heart disease of native coronary artery without angina pectoris: Secondary | ICD-10-CM | POA: Diagnosis present

## 2023-09-01 DIAGNOSIS — Z635 Disruption of family by separation and divorce: Secondary | ICD-10-CM

## 2023-09-01 DIAGNOSIS — R0902 Hypoxemia: Secondary | ICD-10-CM | POA: Diagnosis present

## 2023-09-01 DIAGNOSIS — I10 Essential (primary) hypertension: Secondary | ICD-10-CM | POA: Diagnosis present

## 2023-09-01 DIAGNOSIS — Z87442 Personal history of urinary calculi: Secondary | ICD-10-CM

## 2023-09-01 DIAGNOSIS — J9312 Secondary spontaneous pneumothorax: Secondary | ICD-10-CM | POA: Diagnosis present

## 2023-09-01 DIAGNOSIS — Z681 Body mass index (BMI) 19 or less, adult: Secondary | ICD-10-CM

## 2023-09-01 DIAGNOSIS — Z87891 Personal history of nicotine dependence: Secondary | ICD-10-CM

## 2023-09-01 DIAGNOSIS — Z8709 Personal history of other diseases of the respiratory system: Principal | ICD-10-CM

## 2023-09-01 DIAGNOSIS — R000 Tachycardia, unspecified: Secondary | ICD-10-CM

## 2023-09-01 DIAGNOSIS — Z9079 Acquired absence of other genital organ(s): Secondary | ICD-10-CM

## 2023-09-01 DIAGNOSIS — N138 Other obstructive and reflux uropathy: Secondary | ICD-10-CM

## 2023-09-01 DIAGNOSIS — J449 Chronic obstructive pulmonary disease, unspecified: Secondary | ICD-10-CM

## 2023-09-01 DIAGNOSIS — I7143 Infrarenal abdominal aortic aneurysm, without rupture: Secondary | ICD-10-CM | POA: Diagnosis present

## 2023-09-01 DIAGNOSIS — R531 Weakness: Secondary | ICD-10-CM

## 2023-09-01 DIAGNOSIS — R6339 Other feeding difficulties: Secondary | ICD-10-CM

## 2023-09-01 DIAGNOSIS — N210 Calculus in bladder: Secondary | ICD-10-CM

## 2023-09-01 DIAGNOSIS — R627 Adult failure to thrive: Secondary | ICD-10-CM | POA: Diagnosis present

## 2023-09-01 DIAGNOSIS — E43 Unspecified severe protein-calorie malnutrition: Secondary | ICD-10-CM | POA: Diagnosis present

## 2023-09-01 LAB — COMPREHENSIVE METABOLIC PANEL
ALT: 15 U/L (ref 0–55)
AST (SGOT): 28 U/L (ref 5–41)
Albumin/Globulin Ratio: 1.2 (ref 0.9–2.2)
Albumin: 4.1 g/dL (ref 3.5–5.0)
Alkaline Phosphatase: 85 U/L (ref 37–117)
Anion Gap: 12 (ref 5.0–15.0)
BUN: 14 mg/dL (ref 9–28)
Bilirubin, Total: 0.8 mg/dL (ref 0.2–1.2)
CO2: 24 meq/L (ref 17–29)
Calcium: 9.8 mg/dL (ref 7.9–10.2)
Chloride: 106 meq/L (ref 99–111)
Creatinine: 0.9 mg/dL (ref 0.5–1.5)
GFR: >60 mL/min/{1.73_m2} (ref >=60.0)
Globulin: 3.5 g/dL (ref 2.0–3.6)
Glucose: 190 mg/dL — ABNORMAL HIGH (ref 70–100)
Potassium: 4.5 meq/L (ref 3.5–5.3)
Protein, Total: 7.6 g/dL (ref 6.0–8.3)
Sodium: 142 meq/L (ref 135–145)

## 2023-09-01 LAB — LAB USE ONLY - CBC WITH DIFFERENTIAL
Absolute Basophils: 0.09 10*3/uL — ABNORMAL HIGH (ref 0.00–0.08)
Absolute Eosinophils: 0.16 10*3/uL (ref 0.00–0.44)
Absolute Immature Granulocytes: 0.03 10*3/uL (ref 0.00–0.07)
Absolute Lymphocytes: 1.22 10*3/uL (ref 0.42–3.22)
Absolute Monocytes: 0.51 10*3/uL (ref 0.21–0.85)
Absolute Neutrophils: 4.53 10*3/uL (ref 1.10–6.33)
Absolute nRBC: 0 10*3/uL (ref <=0.00)
Basophils %: 1.4 %
Eosinophils %: 2.4 %
Hematocrit: 45.9 % (ref 37.6–49.6)
Hemoglobin: 14.4 g/dL (ref 12.5–17.1)
Immature Granulocytes %: 0.5 %
Lymphocytes %: 18.7 %
MCH: 28.1 pg (ref 25.1–33.5)
MCHC: 31.4 g/dL — ABNORMAL LOW (ref 31.5–35.8)
MCV: 89.6 fL (ref 78.0–96.0)
MPV: 9.9 fL (ref 8.9–12.5)
Monocytes %: 7.8 %
Neutrophils %: 69.2 %
Platelet Count: 287 10*3/uL (ref 142–346)
Preliminary Absolute Neutrophil Count: 4.53 10*3/uL (ref 1.10–6.33)
RBC: 5.12 10*6/uL (ref 4.20–5.90)
RDW: 13 % (ref 11–15)
WBC: 6.54 10*3/uL (ref 3.10–9.50)
nRBC %: 0 /100{WBCs} (ref <=0.0)

## 2023-09-01 LAB — ECG 12-LEAD
Atrial Rate: 122 {beats}/min
P Axis: 86 degrees
P-R Interval: 150 ms
Q-T Interval: 320 ms
QRS Duration: 76 ms
QTC Calculation (Bezet): 456 ms
R Axis: 74 degrees
T Axis: 87 degrees
Ventricular Rate: 122 {beats}/min

## 2023-09-01 LAB — HIGH SENSITIVITY TROPONIN-I: hs Troponin: 2.8 ng/L (ref <=35.0)

## 2023-09-01 MED ORDER — BENZONATATE 100 MG PO CAPS
100.0000 mg | ORAL_CAPSULE | Freq: Three times a day (TID) | ORAL | Status: DC | PRN
Start: 2023-09-01 — End: 2023-09-01

## 2023-09-01 MED ORDER — HEPARIN SODIUM (PORCINE) 5000 UNIT/ML IJ SOLN
5000.0000 [IU] | Freq: Two times a day (BID) | INTRAMUSCULAR | Status: DC
Start: 2023-09-01 — End: 2023-09-01

## 2023-09-01 MED ORDER — BENZOCAINE-MENTHOL MT LOZG (WRAP)
1.0000 | LOZENGE | OROMUCOSAL | Status: DC | PRN
Start: 2023-09-01 — End: 2023-09-01

## 2023-09-01 MED ORDER — NALOXONE HCL 0.4 MG/ML IJ SOLN (WRAP)
0.2000 mg | INTRAMUSCULAR | Status: DC | PRN
Start: 2023-09-01 — End: 2023-09-01

## 2023-09-01 MED ORDER — GLUCAGON 1 MG IJ SOLR (WRAP)
1.0000 mg | INTRAMUSCULAR | Status: DC | PRN
Start: 2023-09-01 — End: 2023-09-01

## 2023-09-01 MED ORDER — GLUCOSE 40 % PO GEL (WRAP)
15.0000 g | ORAL | Status: DC | PRN
Start: 2023-09-01 — End: 2023-09-01

## 2023-09-01 MED ORDER — DEXTROSE 50 % IV SOLN
12.5000 g | INTRAVENOUS | Status: DC | PRN
Start: 2023-09-01 — End: 2023-09-01

## 2023-09-01 MED ORDER — SALINE SPRAY 0.65 % NA SOLN
2.0000 | NASAL | Status: DC | PRN
Start: 2023-09-01 — End: 2023-09-01

## 2023-09-01 MED ORDER — DEXTROSE 10 % IV BOLUS
12.5000 g | INTRAVENOUS | Status: DC | PRN
Start: 2023-09-01 — End: 2023-09-01

## 2023-09-01 MED ORDER — AMLODIPINE BESYLATE 2.5 MG PO TABS
2.5000 mg | ORAL_TABLET | Freq: Every day | ORAL | Status: DC
Start: 2023-09-02 — End: 2023-09-01

## 2023-09-01 MED ORDER — CARBOXYMETHYLCELLULOSE SOD PF 0.5 % OP SOLN
1.0000 [drp] | Freq: Three times a day (TID) | OPHTHALMIC | Status: DC | PRN
Start: 2023-09-01 — End: 2023-09-01

## 2023-09-01 MED ORDER — ACETAMINOPHEN 325 MG PO TABS
650.0000 mg | ORAL_TABLET | Freq: Four times a day (QID) | ORAL | Status: DC | PRN
Start: 2023-09-01 — End: 2023-09-01

## 2023-09-01 MED ORDER — MELATONIN 3 MG PO TABS
3.0000 mg | ORAL_TABLET | Freq: Every evening | ORAL | Status: DC | PRN
Start: 2023-09-01 — End: 2023-09-01

## 2023-09-01 NOTE — Progress Notes (Signed)
Have you seen any specialists/other providers since your last visit with [place]?    Yes    Health Maintenance Due   Topic Date Due    Statin Use  Never done    Colorectal Cancer Screening  Never done    Medicare Annual Wellness Visit  Never done    Shingrix Vaccine 50+ (1) Never done    Tetanus Ten-Year  Never done    Pneumonia Vaccine Age 74+ (1 of 2 - PCV) Never done    HEPATITIS C SCREENING  Never done    INFLUENZA VACCINE  Never done    COVID-19 Vaccine (3 - 2023-24 season) 07/31/2023

## 2023-09-01 NOTE — Progress Notes (Signed)
[facility] PRIMARY CARE OFFICE VISIT  [place] Daughter Interpreting- Amy                   PR[facility] C[facility]                                 Date: 09/01/2023 3:15 PM   Patient ID: [name] [name] is a 72 y.o. male.    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    The patient's daughter reports he has not been compliant with his blood pressure medications for the past couple of weeks, and has had progressively worsening fatigue and shortness of breath.  In the past, the patient has been referred to nutrition however refused to follow-up.    He endorsed frequency however denied dysuria, hematuria, straining with micturition.    Today in the clinic, patient was hypertensive, tachycardic, with SpO2 of 92%.  He appeared frail.    Problem List:  Problem List[1]    Current Medications:  Medications Taking[2]    Allergies:  Allergies[3]    Past Medical History:  Medical History[4]    Past Surgical History:  Past Surgical History[5]    Family History:  Family History[6]    Social History:  Social History[7]       The following sections were reviewed this encounter by the provider:   Tobacco  Allergies  Meds  Problems  Med Hx  Surg Hx  Fam Hx           ROS:  Review of Systems   Constitutional:  Negative for chills and fever.  Positive for fatigue.  HENT:  Negative for congestion, sore throat.    Eyes:  Negative for pain and visual disturbance.   Respiratory:  Negative for apnea, choking, chest tightness, positive for cough,  shortness of breath, wheezing and stridor.    Cardiovascular:  Negative for chest pain, and leg swelling.  Positive for palpitations  Gastrointestinal:  Negative for abdominal distention, abdominal pain, diarrhea, nausea and vomiting.   Endocrine: Negative for polydipsia, polyphagia and polyuria.   Genitourinary: Positive for difficulty urinating and frequency.   Skin:  Negative for pallor and rash.   Neurological: Positive for weakness, light-headedness  Psychiatric/Behavioral:  Negative for agitation.    Objective:   Vitals:  BP (!) 142/91 (BP Site: Left arm, Patient Position: Sitting, Cuff Size: Medium)   Pulse (!) 132   Temp 98 F (36.7 C) (Oral)   Resp 20   Ht 1.6 m (5\' 3" )   Wt 37 kg (81 lb 8 oz)   SpO2 94%   BMI 14.44 kg/m       Physical Exam:  General Examination:   Physical Exam   General Examination:   Vitals and nursing note reviewed.   Constitutional:       Appearance: Cachectic     Buccal mucosa: Dry  Cardiovascular:      Rate and Rhythm: Tachycardia and regular rhythm.  Pulses: Normal pulses.      Heart sounds: Murmur  Pulmonary:      Effort: Pulmonary effort is normal.      Breath sounds: Reduced breath sounds.   Abdominal:      General: There is no distension.      Palpations: Abdomen is soft. There is no mass.      Tenderness: There is no abdominal tenderness. There is no right CVA tenderness or left CVA tenderness.   Musculoskeletal:         General: No tenderness. Normal range of motion.      Cervical back: Normal range of motion and neck supple.   Skin:     General: Skin is dry.      Capillary Refill: Capillary refill takes greater than 2 seconds.   Neurological:      General: Generalized weakness     Mental Status: He is alert and oriented to person, place, and time.   Psychiatric:         Mood and Affect: Mood normal.         Behavior: Behavior normal.     EKG Results       Procedure Component Value Units Date/Time    ECG 12 lead [[id number]] Collected: 09/01/23 1526     Updated:  09/01/23 1528     Ventricular Rate 122 BPM      Atrial Rate 122 BPM      P-R Interval 150 ms      QRS Duration 76 ms      Q-T Interval 320 ms      QTC Calculation (Bezet) 456 ms      P Axis 86 degrees      R Axis 74 degrees      T Axis 87 degrees      [facility] MUSE NARRATIVE AND IMPRESSION --     SINUS TACHYCARDIA  BIATRIAL ENLARGEMENT  ABNORMAL ECG      Narrative:      SINUS TACHYCARDIA  BIATRIAL ENLARGEMENT  ABNORMAL ECG           Assessment and Plan:       1. Weakness generalized  2. Feeding difficulty in elderly  3. Cachexia  Chronic, ongoing, gradually worsened the past 3 weeks per daughter, patient refused to take meals, refused compliance with his blood pressure medication.  Patient reports he feels tired all the time does not remember what medication he needs and everything makes him nauseated.    4. Tachycardia  Sinus tachycardia-122, right atrial enlargement, no ischemic changes in the ST segments.  - ECG 12 lead    5. Pulmonary emphysema, unspecified emphysema type  60-pack-year history, quit in 2023.  Previously diagnosed COPD.  Given cachexia, anorexia and recent tachycardia, patient would benefit from chest imaging.    6. Benign prostatic hyperplasia with urinary obstruction  Reports difficulty urinating, has a background history of BPH status post procedures with urologist.    Benetta Spar at the [facility] emergency room called today, handoff provided, patient recommended to go to the emergency room given significant weight loss, refusal of feeds, fatigue, tachycardia, worsening shortness of breath.  The patient and daughter expressed understanding and agreement with this plan.    Thurman Coyer, MD     This note was generated within the EPIC EMR using Dragon medical speech recognition software and may contain inherent errors or omissions not intended by the user. Grammatical and punctuation errors, random word insertions, deletions, pronoun errors and incomplete  sentences are occasional consequences of  this technology due to software limitations. Not all errors are caught or corrected.  Although every attempt is made to root out erroneus and incomplete transcription, the note may still not fully represent the intent or opinion of the author. If there are questions or concerns about the content of this note or information contained within the body of this dictation they should be addressed directly with the author for clarification.            [1]   Patient Active Problem List  Diagnosis    Bladder stone    Urinary retention    Benign prostatic hyperplasia with lower urinary tract symptoms    Right kidney stone    Ascending aorta dilation    History of smoking    BMI less than 19,adult    Abnormal weight    Pulmonary emphysema, unspecified emphysema type    Coronary artery calcification seen on CT scan    Benign prostatic hyperplasia with urinary obstruction    History of pneumothorax    Primary hypertension   [2]   Outpatient Medications Marked as Taking for the 09/01/23 encounter (Office Visit) with Thurman Coyer, MD   Medication Sig Dispense Refill    amLODIPine (NORVASC) 2.5 MG tablet Take 1 tablet (2.5 mg) by mouth daily 90 tablet 1   [3] No Known Allergies  [4]   Past Medical History:  Diagnosis Date    Bilateral cataracts 01/12/2023    Collapsed lung     Emphysema lung     Enlarged prostate    [5]   Past Surgical History:  Procedure Laterality Date    CYSTOSCOPY, LITHOLAPAXY N/A 01/04/2020    Procedure: Gabriel Rainwater;  Surgeon: Miquel Dunn, MD;  Location: [facility];  Service: Urology;  Laterality: N/A;  CYSTOLITHOLAPAXY    CYSTOSCOPY, TURP N/A 09/27/2022    Procedure: CYSTOSCOPY, TRANSURETHRAL RESECTION OF PROSTATE , TURP, CYSTOLITHOPLAXY;  Surgeon: Miquel Dunn, MD;  [facility];  Service: Urology;  Laterality: N/A;    PROSTATE SURGERY  2019   [6]   Family History  Problem Relation Name Age of Onset    No known problems Mother      No known problems Father     [7]    Social History  Tobacco Use    Smoking status: Former     Current packs/day: 0.00     Average packs/day: 0.3 packs/day for 50.0 years (12.5 ttl pk-yrs)     Types: Cigarettes     Start date: 06/26/1972     Quit date: 06/26/2022     Years since quitting: 1.1    Smokeless tobacco: Never   Vaping Use    Vaping status: Never Used   Substance Use Topics    Alcohol use: Never    Drug use: Never

## 2023-09-01 NOTE — ED to IP RN Note (Signed)
[facility] EMERGENCY DEPARTMENT  ED NURSING NOTE FOR THE RECEIVING INPATIENT NURSE   ED NURSE [place] [phone]   ED CHARGE RN Melanie   ADMISSION INFORMATION   Stiven Kaspar is a 72 y.o. male admitted with an ED diagnosis of:    1. Pneumothorax         Isolation: None   Allergies: Patient has no known allergies.   Holding Orders confirmed? Yes   Belongings Documented? Yes   Home medications sent to pharmacy confirmed? N/A   NURSING CARE   Patient Comes From:   Mental Status: Home Independent  alert and oriented   ADL: Independent with all ADLs   Ambulation: no difficulty   Pertinent Information  and Safety Concerns:     Broset Violence Risk Level: Low please call RN     CT / NIH   CT Head ordered on this patient?  No   NIH/Dysphagia assessment done prior to admission? No   VITAL SIGNS (at the time of this note)      Vitals:    09/01/23 2000   BP: 131/76   Pulse: 95   Resp: 22   Temp:    SpO2: 95%     Pain Score: 0-No pain (09/01/23 1633)

## 2023-09-01 NOTE — ED Notes (Signed)
Pt placed on 2 liters via NC for comfort measures. No hypoxia prior to placing pt on NC, however sats improved from 94% to 99% on 2 liters.

## 2023-09-01 NOTE — ED Provider Notes (Signed)
EMERGENCY DEPARTMENT NOTE     Patient initially seen and examined at   ED PHYSICIAN ASSIGNED       Date/Time Event User Comments    09/01/23 1604 Physician Assigned Litisha Guagliardo A. Ronnie Derby, MD assigned as Attending    09/01/23 1949 Physician Assigned Ottavio Norem, Sena Hitch. Henderson Newcomer, MD assigned as Attending           ED MIDLEVEL (APP) ASSIGNED       Date/Time Event User Comments    09/01/23 1955 PA/NP Provider Assigned TEMPESTA, Sharyn Lull Alena Bills, NP assigned as Nurse Practitioner            HISTORY OF PRESENT ILLNESS       Chief Complaint: Shortness of Breath       72 y.o. male with past medical history as below who presents to the emergency department with shortness of breath over the past 1 year and feels it has been getting worse over the past 3 days. No chest pain or pain with deep breaths. No cough, fever, sore throat or congestion. No nausea or vomiting. Patient smoked for over 50 years. Stopped 1 year ago.     Independent Historian (other than patient): No  Additional History Provided by Independent Historian:  MEDICAL HISTORY     Past Medical History:  Past Medical History:   Diagnosis Date    Bilateral cataracts 01/12/2023    Collapsed lung     Emphysema lung     Enlarged prostate        Past Surgical History:  Past Surgical History[1]    Social History:  Social History[2]    Family History:  Family History[3]    Outpatient Medication:  Previous Medications    AMLODIPINE (NORVASC) 2.5 MG TABLET    Take 1 tablet (2.5 mg) by mouth daily         REVIEW OF SYSTEMS   Review of Systems See History of Present Illness  PHYSICAL EXAM     ED Triage Vitals   Encounter Vitals Group      BP 09/01/23 1608 (!) 153/92      Systolic BP Percentile --       Diastolic BP Percentile --       Heart Rate 09/01/23 1608 (!) 125      Resp Rate 09/01/23 1608 22      Temp 09/01/23 1609 97.6 F (36.4 C)      Temp src 09/01/23 1609 Temporal      SpO2 09/01/23 1608 96 %      Weight --       Height --       Head  Circumference --       Peak Flow --       Pain Score 09/01/23 1603 0      Pain Loc --       Pain Education --       Exclude from Growth Chart --      Physical Exam   GENERAL: Nontoxic, no acute distress, alert, oriented x 4  HEAD: NCAT  ENT: MMM  NECK: supple, trachea midline  LUNGS: Normal respirations. No wheezing or rhonchi. Decreased/absent breath sounds in the left lung base. No accessory muscle use.   CV: RRR  GI: nondistended  BACK: no deformity .  EXT:  Well perfused.  SKIN: warm and dry, nondiaphoretic  NEURO:  moving all 4 extremities on demand without deficit.    MEDICAL DECISION MAKING  PRIMARY PROBLEM LIST      Acute illness/injury DIAGNOSIS:   Chronic Illness Impacting Care of the above problem: COPD Increases complexity of evaluation, Increases the risk of severe disease, Increase the risk of disease progression, Increases the risk of poor wound healing, and Limits treatment options  Differential Diagnosis: Spontaneous pneumothorax, extensive emphysema, COPD, Asthma, pneumonia, pleural effusion, CHF  DISCUSSION      71 y.o. male who presents to the ED with a spontaneous pneumothorax. I reviewed prior CT images from 2023 which showed a pneumothorax on the right that resolved. Patient currently stable with oxygen saturations above 90% on room air. RR 17-22. Patient started on 2L NC for comfort and now Saturating 99% in the room.         ED Course as of 09/01/23 2116   Thu Sep 01, 2023   1932 Discussed with Ghafouri who recommended admission. Observation. Repeat Xray in the morning unless patient's status changes. Pulmonology can be consulted to see him as well as IR if there is a change. This maybe chronic and stable. [RM]   2040 Discussed with Dr Tera Mater, Thoracic surgery, who recommended transfer to [facility] ED and then admission to hospitalist once evaluated. Patient most likely stable but may benefit from VATS procedure since this is his second pneumothorax. No thoracic surgery at [facility].  [RM]   2113 Discussed with Debbe Bales ED physician at [facility] who has agreed to accept this patient for admission.  [RM]      ED Course User Index  [RM] Ronnie Derby, MD                     External Records Reviewed?: Physician Office Records and Inpatient Records    Additional Notes                  Vital Signs: Reviewed the patient's vital signs.   Nursing Notes: Reviewed and utilized available nursing notes.  Medical Records Reviewed: Reviewed available past medical records.  Counseling: The emergency provider has spoken with the patient and discussed today's findings, in addition to providing specific details for the plan of care.  Questions are answered and there is agreement with the plan.          MIPS DOCUMENTATION                RADIOLOGY IMAGING STUDIES      CT Chest without Contrast   Final Result      Moderate left-sided pneumothorax approximately 25 % of the volume. These   critical findings earlier communicated on the radiograph interpretation.      Severe bullous pulmonary emphysema.      Redemonstration of staghorn right nephrolithiasis an infrarenal abdominal   aortic aneurysm.      Pankaj Dominica, MD   09/01/2023 7:10 PM      Chest AP Portable   Final Result    New left-sided pneumothorax.      Critical results were reported to and acknowledged by Dr. Dorris Fetch on   09/01/2023 5:18 PM.      Wynema Birch, MD   09/01/2023 5:18 PM          EMERGENCY DEPT. MEDICATIONS      ED Medication Orders (From admission, onward)      Start Ordered     Status Ordering Provider    09/02/23 0900 09/01/23 2050  amLODIPine (NORVASC) tablet 2.5 mg  Daily        Route:  Oral  Ordered Dose: 2.5 mg       Ordered TEMPESTA, BARBARA J    09/01/23 2100 09/01/23 2050  heparin (porcine) injection 5,000 Units  Every 12 hours scheduled        Route: Subcutaneous  Ordered Dose: 5,000 Units       Ordered TEMPESTA, BARBARA J    09/01/23 2050 09/01/23 2050  dextrose (GLUCOSE) 40 % oral gel 15 g of glucose  As needed         Route: Oral  Ordered Dose: 15 g of glucose      Placed in "Or" Linked Group    Ordered TEMPESTA, BARBARA J    09/01/23 2050 09/01/23 2050  dextrose (D10W) 10% bolus 125 mL  As needed        Route: Intravenous  Ordered Dose: 12.5 g      Placed in "Or" Linked Group    Ordered Philippa Chester J    09/01/23 2050 09/01/23 2050  dextrose 50 % bolus 12.5 g  As needed        Route: Intravenous  Ordered Dose: 12.5 g      Placed in "Or" Linked Group    Ordered Philippa Chester J    09/01/23 2050 09/01/23 2050  glucagon (rDNA) (GLUCAGEN) injection 1 mg  As needed        Route: Intramuscular  Ordered Dose: 1 mg      Placed in "Or" Linked Group    Ordered Philippa Chester J    09/01/23 2050 09/01/23 2050  acetaminophen (TYLENOL) tablet 650 mg  Every 6 hours PRN        Route: Oral  Ordered Dose: 650 mg       Ordered TEMPESTA, BARBARA J    09/01/23 2050 09/01/23 2050  melatonin tablet 3 mg  At bedtime PRN        Route: Oral  Ordered Dose: 3 mg       Ordered TEMPESTA, BARBARA J    09/01/23 2050 09/01/23 2050  saline (OCEAN NASAL SPRAY) 0.65 % nasal solution 2 spray  Every 4 hours PRN        Route: Each Nare  Ordered Dose: 2 spray       Ordered TEMPESTA, BARBARA J    09/01/23 2050 09/01/23 2050  carboxymethylcellulose (PF) (REFRESH PLUS) 0.5 % ophthalmic solution 1 drop  3 times daily PRN        Route: Both Eyes  Ordered Dose: 1 drop       Ordered TEMPESTA, BARBARA J    09/01/23 2050 09/01/23 2050  benzocaine-menthol (CEPACOL/CHLORASEPTIC) lozenge 1 lozenge  Every 2 hours PRN        Route: Buccal  Ordered Dose: 1 lozenge       Ordered TEMPESTA, BARBARA J    09/01/23 2050 09/01/23 2050  benzonatate (TESSALON) capsule 100 mg  3 times daily PRN        Route: Oral  Ordered Dose: 100 mg       Ordered TEMPESTA, BARBARA J    09/01/23 2050 09/01/23 2050  naloxone (NARCAN) injection 0.2 mg  As needed        Route: Intravenous  Ordered Dose: 0.2 mg       Ordered TEMPESTA, BARBARA J            LABORATORY RESULTS    Ordered and  independently interpreted AVAILABLE laboratory tests.   Results       Procedure Component Value Units Date/Time  High Sensitivity Troponin-I [[id number]]  (Normal) Collected: 09/01/23 1628    Specimen: Blood, Venous Updated: 09/01/23 1736     hs Troponin 2.8 ng/L     Comprehensive Metabolic Panel [[id number]]  (Abnormal) Collected: 09/01/23 1628    Specimen: Blood, Venous Updated: 09/01/23 1728     Glucose 190 mg/dL      BUN 14 mg/dL      Creatinine 0.9 mg/dL      Sodium 366 mEq/L      Potassium 4.5 mEq/L      Chloride 106 mEq/L      CO2 24 mEq/L      Calcium 9.8 mg/dL      Anion Gap 44.0     GFR >60.0 mL/min/1.73 m2      AST (SGOT) 28 U/L      ALT 15 U/L      Alkaline Phosphatase 85 U/L      Albumin 4.1 g/dL      Protein, Total 7.6 g/dL      Globulin 3.5 g/dL      Albumin/Globulin Ratio 1.2     Bilirubin, Total 0.8 mg/dL     CBC with Differential (Order) [[id number]]  (Abnormal) Collected: 09/01/23 1628    Specimen: Blood, Venous Updated: 09/01/23 1715    Narrative:      The following orders were created for panel order CBC with Differential (Order).  Procedure                               Abnormality         Status                     ---------                               -----------         ------                     CBC with Differential (C.Marland KitchenMarland Kitchen[[id number]]  Abnormal            Final result                 Please view results for these tests on the individual orders.    CBC with Differential (Component) [[id number]]  (Abnormal) Collected: 09/01/23 1628    Specimen: Blood, Venous Updated: 09/01/23 1715     WBC 6.54 x10 3/uL      Hemoglobin 14.4 g/dL      Hematocrit 06.3 %      Platelet Count 287 x10 3/uL      MPV 9.9 fL      RBC 5.12 x10 6/uL      MCV 89.6 fL      MCH 28.1 pg      MCHC 31.4 g/dL      RDW 13 %      nRBC % 0.0 /100 WBC      Absolute nRBC 0.00 x10 3/uL      Preliminary Absolute Neutrophil Count 4.53 x10 3/uL      Neutrophils % 69.2 %      Lymphocytes % 18.7 %      Monocytes % 7.8 %      Eosinophils %  2.4 %      Basophils % 1.4 %      Immature Granulocytes % 0.5 %  Absolute Neutrophils 4.53 x10 3/uL      Absolute Lymphocytes 1.22 x10 3/uL      Absolute Monocytes 0.51 x10 3/uL      Absolute Eosinophils 0.16 x10 3/uL      Absolute Basophils 0.09 x10 3/uL      Absolute Immature Granulocytes 0.03 x10 3/uL               CRITICAL CARE/PROCEDURES    Procedures    DIAGNOSIS      Diagnosis:  Final diagnoses:   Pneumothorax   Pulmonary emphysema, unspecified emphysema type       Disposition:  ED Disposition       ED Disposition   Transfer to Surgery [facility] ED    Condition   --    Date/Time   Thu Sep 01, 2023  9:13 PM    Comment   Patient to be transferred to [facility] Emergency Department. Accepting physician Dr Rayne Du               Prescriptions:  Patient's Medications   New Prescriptions    No medications on file   Previous Medications    AMLODIPINE (NORVASC) 2.5 MG TABLET    Take 1 tablet (2.5 mg) by mouth daily   Modified Medications    No medications on file   Discontinued Medications    No medications on file           This note was generated by the Epic EMR system/ Dragon speech recognition and may contain inherent errors or omissions not intended by the user. Grammatical errors, random word insertions, deletions and pronoun errors  are occasional consequences of this technology due to software limitations. Not all errors are caught or corrected. If there are questions or concerns about the content of this note or information contained within the body of this dictation they should be addressed directly with the author for clarification.         [1]   Past Surgical History:  Procedure Laterality Date    CYSTOSCOPY, LITHOLAPAXY N/A 01/04/2020    Procedure: Gabriel Rainwater;  Surgeon: Miquel Dunn, MD;  Location: [facility];  Service: Urology;  Laterality: N/A;  CYSTOLITHOLAPAXY    CYSTOSCOPY, TURP N/A 09/27/2022    Procedure: CYSTOSCOPY, TRANSURETHRAL RESECTION OF PROSTATE , TURP, CYSTOLITHOPLAXY;   Surgeon: Miquel Dunn, MD;  Location: [facility];  Service: Urology;  Laterality: N/A;    PROSTATE SURGERY  2019   [2]   Social History  Socioeconomic History    Marital status: Legally Separated   Occupational History    Occupation: retired     Comment: Sports coach   Tobacco Use    Smoking status: Former     Current packs/day: 0.00     Average packs/day: 0.3 packs/day for 50.0 years (12.5 ttl pk-yrs)     Types: Cigarettes     Start date: 06/26/1972     Quit date: 06/26/2022     Years since quitting: 1.1    Smokeless tobacco: Never   Vaping Use    Vaping status: Never Used   Substance and Sexual Activity    Alcohol use: Never    Drug use: Never   Social History Narrative    Lives with son and daughter    Has daughter in Social worker.      Social Determinants of Health     Financial Resource Strain: Low Risk  (09/01/2023)    Overall Financial Resource Strain (CARDIA)  Difficulty of Paying Living Expenses: Not hard at all   Food Insecurity: No Food Insecurity (09/01/2023)    Hunger Vital Sign     Worried About Running Out of Food in the Last Year: Never true     Ran Out of Food in the Last Year: Never true   Transportation Needs: No Transportation Needs (09/01/2023)    PRAPARE - Therapist, art (Medical): No     Lack of Transportation (Non-Medical): No   Physical Activity: Inactive (09/01/2023)    Exercise Vital Sign     Days of Exercise per Week: 0 days     Minutes of Exercise per Session: 0 min   Stress: Stress Concern Present (09/01/2023)    [employer] of Occupational Health - Occupational Stress Questionnaire     Feeling of Stress : To some extent   Social Connections: Socially Isolated (09/01/2023)    Social Connection and Isolation Panel [NHANES]     Frequency of Communication with Friends and Family: More than three times a week     Frequency of Social Gatherings with Friends and Family: More than three times a week     Attends Religious Services: Never     Database administrator  or Organizations: No     Attends Banker Meetings: Never     Marital Status: Separated   Intimate Partner Violence: Not At Risk (09/01/2023)    Humiliation, Afraid, Rape, and Kick questionnaire     Fear of Current or Ex-Partner: No     Emotionally Abused: No     Physically Abused: No     Sexually Abused: No   Housing Stability: Low Risk  (09/01/2023)    Housing Stability Vital Sign     Unable to Pay for Housing in the Last Year: No     Number of Times Moved in the Last Year: 0     Homeless in the Last Year: No   [3]   Family History  Problem Relation Name Age of Onset    No known problems Mother      No known problems Father          Ronnie Derby, MD  09/02/23 2103

## 2023-09-02 ENCOUNTER — Inpatient Hospital Stay: Payer: Medicare Other

## 2023-09-02 DIAGNOSIS — Z681 Body mass index (BMI) 19 or less, adult: Secondary | ICD-10-CM

## 2023-09-02 DIAGNOSIS — I10 Essential (primary) hypertension: Secondary | ICD-10-CM

## 2023-09-02 DIAGNOSIS — N401 Enlarged prostate with lower urinary tract symptoms: Secondary | ICD-10-CM

## 2023-09-02 DIAGNOSIS — N138 Other obstructive and reflux uropathy: Secondary | ICD-10-CM

## 2023-09-02 DIAGNOSIS — R6889 Other general symptoms and signs: Secondary | ICD-10-CM

## 2023-09-02 DIAGNOSIS — J939 Pneumothorax, unspecified: Principal | ICD-10-CM | POA: Diagnosis present

## 2023-09-02 DIAGNOSIS — J439 Emphysema, unspecified: Secondary | ICD-10-CM

## 2023-09-02 DIAGNOSIS — R339 Retention of urine, unspecified: Secondary | ICD-10-CM

## 2023-09-02 LAB — BASIC METABOLIC PANEL
Anion Gap: 10 (ref 5.0–15.0)
BUN: 18 mg/dL (ref 9–28)
CO2: 24 meq/L (ref 17–29)
Calcium: 9.8 mg/dL (ref 7.9–10.2)
Chloride: 109 meq/L (ref 99–111)
Creatinine: 0.8 mg/dL (ref 0.5–1.5)
GFR: >60 mL/min/{1.73_m2} (ref >=60.0)
Glucose: 78 mg/dL (ref 70–100)
Potassium: 4.2 meq/L (ref 3.5–5.3)
Sodium: 143 meq/L (ref 135–145)

## 2023-09-02 LAB — LAB USE ONLY - CBC WITH DIFFERENTIAL
Absolute Basophils: 0.11 10*3/uL — ABNORMAL HIGH (ref 0.00–0.08)
Absolute Eosinophils: 0.36 10*3/uL (ref 0.00–0.44)
Absolute Immature Granulocytes: 0.02 10*3/uL (ref 0.00–0.07)
Absolute Lymphocytes: 1.52 10*3/uL (ref 0.42–3.22)
Absolute Monocytes: 0.88 10*3/uL — ABNORMAL HIGH (ref 0.21–0.85)
Absolute Neutrophils: 4.23 10*3/uL (ref 1.10–6.33)
Absolute nRBC: 0 10*3/uL (ref <=0.00)
Basophils %: 1.5 %
Eosinophils %: 5.1 %
Hematocrit: 44.1 % (ref 37.6–49.6)
Hemoglobin: 13.9 g/dL (ref 12.5–17.1)
Immature Granulocytes %: 0.3 %
Lymphocytes %: 21.3 %
MCH: 28.1 pg (ref 25.1–33.5)
MCHC: 31.5 g/dL (ref 31.5–35.8)
MCV: 89.3 fL (ref 78.0–96.0)
MPV: 10 fL (ref 8.9–12.5)
Monocytes %: 12.4 %
Neutrophils %: 59.4 %
Platelet Count: 279 10*3/uL (ref 142–346)
Preliminary Absolute Neutrophil Count: 4.23 10*3/uL (ref 1.10–6.33)
RBC: 4.94 10*6/uL (ref 4.20–5.90)
RDW: 13 % (ref 11–15)
WBC: 7.12 10*3/uL (ref 3.10–9.50)
nRBC %: 0 /100{WBCs} (ref <=0.0)

## 2023-09-02 LAB — ECG 12-LEAD
Atrial Rate: 123 {beats}/min
P Axis: 88 degrees
P-R Interval: 144 ms
Q-T Interval: 332 ms
QRS Duration: 78 ms
QTC Calculation (Bezet): 475 ms
R Axis: -81 degrees
T Axis: 89 degrees
Ventricular Rate: 123 {beats}/min

## 2023-09-02 LAB — PT/INR
INR: 0.9 (ref 0.9–1.1)
PT: 10.6 s (ref 10.1–12.9)

## 2023-09-02 LAB — HIGH SENSITIVITY TROPONIN-I: hs Troponin: 3.8 ng/L (ref <=35.0)

## 2023-09-02 MED ORDER — POTASSIUM CHLORIDE 10 MEQ/100ML IV SOLN (WRAP)
10.0000 meq | INTRAVENOUS | Status: DC | PRN
Start: 2023-09-02 — End: 2023-09-08

## 2023-09-02 MED ORDER — POTASSIUM CHLORIDE CRYS ER 20 MEQ PO TBCR
0.0000 meq | EXTENDED_RELEASE_TABLET | ORAL | Status: DC | PRN
Start: 2023-09-02 — End: 2023-09-08

## 2023-09-02 MED ORDER — AMLODIPINE BESYLATE 2.5 MG PO TABS
2.5000 mg | ORAL_TABLET | Freq: Every day | ORAL | Status: DC
Start: 2023-09-02 — End: 2023-09-08
  Administered 2023-09-02 – 2023-09-08 (×7): 2.5 mg via ORAL
  Filled 2023-09-02 (×10): qty 1

## 2023-09-02 MED ORDER — POTASSIUM & SODIUM PHOSPHATES 280-160-250 MG PO PACK
2.0000 | PACK | ORAL | Status: DC | PRN
Start: 2023-09-02 — End: 2023-09-08

## 2023-09-02 MED ORDER — MIDAZOLAM HCL 1 MG/ML IJ SOLN (WRAP)
1.0000 mg | INTRAMUSCULAR | Status: AC | PRN
Start: 2023-09-02 — End: 2023-09-02
  Administered 2023-09-02: 1 mg via INTRAVENOUS

## 2023-09-02 MED ORDER — NALOXONE HCL 0.4 MG/ML IJ SOLN (WRAP)
0.2000 mg | INTRAMUSCULAR | Status: DC | PRN
Start: 2023-09-02 — End: 2023-09-08

## 2023-09-02 MED ORDER — LIDOCAINE HCL 1 % IJ SOLN
INTRAMUSCULAR | Status: AC | PRN
Start: 2023-09-02 — End: 2023-09-02
  Administered 2023-09-02: 5 mL

## 2023-09-02 MED ORDER — BENZONATATE 100 MG PO CAPS
100.0000 mg | ORAL_CAPSULE | Freq: Three times a day (TID) | ORAL | Status: DC | PRN
Start: 2023-09-02 — End: 2023-09-08

## 2023-09-02 MED ORDER — DEXTROSE 50 % IV SOLN
12.5000 g | INTRAVENOUS | Status: DC | PRN
Start: 2023-09-02 — End: 2023-09-08

## 2023-09-02 MED ORDER — HYDROMORPHONE HCL 1 MG/ML IJ SOLN
0.4000 mg | INTRAMUSCULAR | Status: DC | PRN
Start: 2023-09-02 — End: 2023-09-08
  Administered 2023-09-02 – 2023-09-04 (×5): 0.4 mg via INTRAVENOUS
  Filled 2023-09-02 (×5): qty 1

## 2023-09-02 MED ORDER — SALINE SPRAY 0.65 % NA SOLN
2.0000 | NASAL | Status: DC | PRN
Start: 2023-09-02 — End: 2023-09-08

## 2023-09-02 MED ORDER — GLUCAGON 1 MG IJ SOLR (WRAP)
1.0000 mg | INTRAMUSCULAR | Status: DC | PRN
Start: 2023-09-02 — End: 2023-09-08

## 2023-09-02 MED ORDER — MAGNESIUM SULFATE IN D5W 1-5 GM/100ML-% IV SOLN
1.0000 g | INTRAVENOUS | Status: DC | PRN
Start: 2023-09-02 — End: 2023-09-08

## 2023-09-02 MED ORDER — ONDANSETRON HCL 4 MG/2ML IJ SOLN
4.0000 mg | Freq: Once | INTRAMUSCULAR | Status: AC
Start: 2023-09-02 — End: 2023-09-02
  Administered 2023-09-02: 4 mg via INTRAVENOUS

## 2023-09-02 MED ORDER — MELATONIN 3 MG PO TABS
3.0000 mg | ORAL_TABLET | Freq: Every evening | ORAL | Status: DC | PRN
Start: 2023-09-02 — End: 2023-09-08
  Administered 2023-09-04: 3 mg via ORAL
  Filled 2023-09-02: qty 1

## 2023-09-02 MED ORDER — LIDOCAINE 5 % EX PTCH
1.0000 | MEDICATED_PATCH | CUTANEOUS | Status: DC
Start: 2023-09-02 — End: 2023-09-08
  Administered 2023-09-02 – 2023-09-03 (×2): 1 via TRANSDERMAL
  Filled 2023-09-02 (×6): qty 1

## 2023-09-02 MED ORDER — GLUCOSE 40 % PO GEL (WRAP)
15.0000 g | ORAL | Status: DC | PRN
Start: 2023-09-02 — End: 2023-09-08

## 2023-09-02 MED ORDER — OXYCODONE HCL 5 MG PO TABS
5.0000 mg | ORAL_TABLET | Freq: Four times a day (QID) | ORAL | Status: DC | PRN
Start: 2023-09-02 — End: 2023-09-08
  Administered 2023-09-02 – 2023-09-03 (×4): 5 mg via ORAL
  Filled 2023-09-02 (×4): qty 1

## 2023-09-02 MED ORDER — ONDANSETRON HCL 4 MG/2ML IJ SOLN
4.0000 mg | Freq: Once | INTRAMUSCULAR | Status: DC
Start: 2023-09-02 — End: 2023-09-08
  Filled 2023-09-02: qty 2

## 2023-09-02 MED ORDER — NALOXONE HCL 0.4 MG/ML IJ SOLN (WRAP)
0.1000 mg | INTRAMUSCULAR | Status: AC | PRN
Start: 2023-09-02 — End: 2023-09-02

## 2023-09-02 MED ORDER — CARBOXYMETHYLCELLULOSE SOD PF 0.5 % OP SOLN
1.0000 [drp] | Freq: Three times a day (TID) | OPHTHALMIC | Status: DC | PRN
Start: 2023-09-02 — End: 2023-09-08

## 2023-09-02 MED ORDER — ENOXAPARIN SODIUM 30 MG/0.3ML IJ SOSY
30.0000 mg | PREFILLED_SYRINGE | Freq: Every day | INTRAMUSCULAR | Status: DC
Start: 2023-09-02 — End: 2023-09-02

## 2023-09-02 MED ORDER — POTASSIUM CHLORIDE 20 MEQ PO PACK
0.0000 meq | PACK | ORAL | Status: DC | PRN
Start: 2023-09-02 — End: 2023-09-08

## 2023-09-02 MED ORDER — FLUMAZENIL 0.5 MG/5ML IV SOLN
0.2000 mg | INTRAVENOUS | Status: AC | PRN
Start: 2023-09-02 — End: 2023-09-02

## 2023-09-02 MED ORDER — FENTANYL CITRATE (PF) 50 MCG/ML IJ SOLN (WRAP)
50.0000 ug | INTRAMUSCULAR | Status: AC | PRN
Start: 2023-09-02 — End: 2023-09-02
  Administered 2023-09-02: 50 ug via INTRAVENOUS

## 2023-09-02 MED ORDER — DEXTROSE 10 % IV BOLUS
12.5000 g | INTRAVENOUS | Status: DC | PRN
Start: 2023-09-02 — End: 2023-09-08

## 2023-09-02 MED ORDER — DIPHENHYDRAMINE HCL 50 MG/ML IJ SOLN
25.0000 mg | Freq: Once | INTRAMUSCULAR | Status: DC | PRN
Start: 2023-09-02 — End: 2023-09-08

## 2023-09-02 MED ORDER — ACETAMINOPHEN 325 MG PO TABS
650.0000 mg | ORAL_TABLET | Freq: Three times a day (TID) | ORAL | Status: DC | PRN
Start: 2023-09-02 — End: 2023-09-08
  Administered 2023-09-02 – 2023-09-04 (×2): 650 mg via ORAL
  Filled 2023-09-02 (×2): qty 2

## 2023-09-02 MED ORDER — BENZOCAINE-MENTHOL MT LOZG (WRAP)
1.0000 | LOZENGE | OROMUCOSAL | Status: DC | PRN
Start: 2023-09-02 — End: 2023-09-08

## 2023-09-02 NOTE — H&P (Addendum)
ADMISSION HISTORY AND PHYSICAL EXAM    Date Time: 09/02/23 12:45 AM  Patient Name: [name],[name]  Attending Physician: [name] Going, MD  Primary Care Physician: [name] Done, MD    CC: Shortness of breath      Assessment / Plan:   72 year old male with past medical history of BPH s/p TURP, COPD, anorexia, aortic aneurysm, cachexia, hypertension, right-sided pneumothorax 2023 referred from PCP office to ED with shortness of breath, worsening fatigue and refusal to eat.  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  -Telemetry monitoring  -regular diet  -Pulmonology consulted and epic chat sent      # Hypertension  - 24hr BP range: (115-179) / (76-114) (09/01/23)  -Continue home: amlodipine 2.5 mg po    No CPR with support  Lovenox subcu  Regular diet    Additional Diagnoses:     Patient has BMI=Body mass index is 14.35 kg/m.  Diagnosis: Cachexia             Disposition: (Please see PAF column for Expected D/C Date)   Today's date: 09/02/2023  Admit Date: 09/01/2023 11:47 PM  Service status: Inpatient: risk of morbidity and mortality  Clinical Milestones: Pending chest tube placement  Anticipated discharge needs: SNF    History of Presenting Illness:   [name] [name] is a 72 y.o. male with past medical history of BPH s/p TURP, COPD, anorexia, aortic aneurysm, cachexia, hypertension presented to ED with shortness of breath, worsening fatigue and refusal to eat.  Patient daughter reports that he has been noncompliant with his blood pressure medications and has been getting more fatigued and short of breath.  Patient reports urinary frequency but denies dysuria, hematuria or straining    Past Medical History:     Past Medical History:   Diagnosis Date    Bilateral cataracts 01/12/2023    Collapsed lung     Emphysema lung     Enlarged prostate        Available old records reviewed, including:  chart    Past Surgical History:   Past Surgical History[1]    Family History:   none    Social History:   Tobacco Use History[2]  Social History     Substance and Sexual Activity   Alcohol Use Never     Social History     Substance and Sexual Activity   Drug Use Never       Allergies:   Allergies[3]    Medications:  Home Medications       Med List Status: In Progress Set By: [name] Connors, RN at 09/02/2023 12:09 AM              amLODIPine (NORVASC) 2.5 MG tablet     Take 1 tablet (2.5 mg) by mouth daily              Method by which medications were confirmed on admission: Patient's daughter    Review of Systems:   All other systems were reviewed and are negative except: As above    Physical Exam:   Patient Vitals for the past 24 hrs:   BP Temp Temp src Pulse Resp SpO2 Height Weight   09/02/23 0010 133/83 -- -- 99 18 100 % -- --   09/02/23 0000 137/88 98.2 F (36.8 C) -- 100 20 100 % -- --   09/01/23 2314 121/80 98.5 F (36.9 C) Oral 93 (!) 24 100 % 1.6 m (5\' 3" ) 36.7 kg (81  lb)     Body mass index is 14.35 kg/m.  No intake or output data in the 24 hours ending 09/02/23 0045    General: awake, alert, oriented x 3; no acute distress.  Cachectic  HEENT: perrla, eomi, sclera anicteric  oropharynx clear without lesions, mucous membranes moist  Neck: supple, no lymphadenopathy, no thyromegaly, no JVD, no carotid bruits  Cardiovascular: regular rate and rhythm, no murmurs, rubs or gallops  Lungs: Creased breath sounds on the left lung  Abdomen: soft, non-tender, non-distended; no palpable masses, no hepatosplenomegaly, normoactive bowel sounds, no rebound or guarding  Extremities: no clubbing, cyanosis, or edema  Neuro: cranial nerves grossly intact, strength 5/5 in upper and lower extremities, sensation intact  Skin: no rashes or lesions noted        Labs:     Results       ** No results found for the last 24 hours. **            Imaging personally reviewed, including: CT, chest x-ray    Safety Checklist  DVT prophylaxis:  CHEST guideline (See page [id number]) Chemical   Foley:  [place] Rn Foley protocol Not present   IVs:  Peripheral IV   PT/OT: Ordered   Daily CBC & or Chem ordered:  SHM/ABIM guidelines (see #5) Yes, due to clinical and lab instability       Signed by: [name] Lah, MD  ZO:[id number], [name] Morgan, MD    This chart was generated using hospital voice-recognition software which does not employ spell-checking or grammar-checking features. It was dictated, all or in part, in a busy and often noisy patient care environment. I have taken all usual measures to dictate carefully and to review all aspects this chart. Nonetheless, given the known and well-documented performance characteristics of VR software in such patient care environments, this dictation still may contain unrecognized and wholly unintended errors or omissions.          [1]   Past Surgical History:  Procedure Laterality Date    CYSTOSCOPY, LITHOLAPAXY N/A 01/04/2020    Procedure: [name] [name];  Surgeon:  [name] Dunn, MD;  Location: [facility];  Service: Urology;  Laterality: N/A;  CYSTOLITHOLAPAXY    CYSTOSCOPY, TURP N/A 09/27/2022    Procedure: CYSTOSCOPY, TRANSURETHRAL RESECTION OF PROSTATE , TURP, CYSTOLITHOPLAXY;  Surgeon: [name] Dunn, MD;  Location: [facility];  Service: Urology;  Laterality: N/A;    PROSTATE SURGERY  2019   [2]  Social History  Tobacco Use   Smoking Status Former    Current packs/day: 0.00    Average packs/day: 0.3 packs/day for 50.0 years (12.5 ttl pk-yrs)    Types: Cigarettes    Start date: 06/26/1972    Quit date: 06/26/2022    Years since quitting: 1.1   Smokeless Tobacco Never   [3] No Known Allergies

## 2023-09-02 NOTE — Plan of Care (Signed)
Problem: Moderate/High Fall Risk Score >5  Goal: Patient will remain free of falls  Outcome: Progressing  Flowsheets (Taken 09/02/2023 0334)  High (Greater than 13):   HIGH-Bed alarm on at all times while patient in bed   HIGH-Initiate use of floor mats as appropriate     Problem: Safety  Goal: Patient will be free from injury during hospitalization  Outcome: Progressing  Flowsheets (Taken 09/02/2023 0334)  Patient will be free from injury during hospitalization:   Assess patient's risk for falls and implement fall prevention plan of care per policy   Provide and maintain safe environment   Ensure appropriate safety devices are available at the bedside   Hourly rounding   Include patient/ family/ care giver in decisions related to safety  Goal: Patient will be free from infection during hospitalization  Outcome: Progressing  Flowsheets (Taken 09/02/2023 0334)  Free from Infection during hospitalization:   Assess and monitor for signs and symptoms of infection   Monitor lab/diagnostic results   Monitor all insertion sites (i.e. indwelling lines, tubes, urinary catheters, and drains)   Encourage patient and family to use good hand hygiene technique     Problem: Inadequate Gas Exchange  Goal: Adequate oxygenation and improved ventilation  Outcome: Progressing  Flowsheets (Taken 09/02/2023 0334)  Adequate oxygenation and improved ventilation:   Monitor SpO2 and treat as needed   Assess lung sounds   Teach/reinforce use of incentive spirometer 10 times per hour while awake, cough and deep breath as needed   Increase activity as tolerated/progressive mobility  Goal: Patent Airway maintained  Outcome: Progressing  Flowsheets (Taken 09/02/2023 0334)  Patent airway maintained: Reinforce use of ordered respiratory interventions (i.e. CPAP, BiPAP, Incentive Spirometer, Acapella, etc.)

## 2023-09-02 NOTE — ED to IP RN Note (Signed)
[facility] EMERGENCY DEPT  ED NURSING NOTE FOR THE RECEIVING INPATIENT NURSE   ED NURSE Janey Genta 1610   ED CHARGE RN Marisue Ivan   ADMISSION INFORMATION   Azeez Dunker is a 72 y.o. male admitted with an ED diagnosis of:    1. Pneumothorax, left    2. Pneumothorax         Isolation: None   Allergies: Patient has no known allergies.   Holding Orders confirmed? No   Belongings Documented? No   Home medications sent to pharmacy confirmed? No   NURSING CARE   Patient Comes From:   Mental Status: Home Independent  alert and oriented   ADL: Needs assistance with ADLs   Ambulation: 1 person assist   Pertinent Information  and Safety Concerns:     Broset Violence Risk Level: Low Admission for left pneumo. Patient on 2L NC with VSS. Patient is AOx4.      CT / NIH   CT Head ordered on this patient?  No   NIH/Dysphagia assessment done prior to admission? N/A   VITAL SIGNS (at the time of this note)      Vitals:    09/02/23 0010   BP: 133/83   Pulse: 99   Resp: 18   Temp:    SpO2: 100%     Pain Score: 0-No pain (09/02/23 0000)

## 2023-09-02 NOTE — Malnutrition Assessment (Cosign Needed)
[name] [name] is a 72 y.o. male patient.   [id number]    Malnutrition Assessment   Malnutrition Documentation    Severe Malnutrition--related to inadequate protein energy intake in the setting of acute illness  as evidenced by  intake < 50% of estimated energy requirements for > 5 days, >5% weight loss x 1 month, severe muscle depletion (temporalis, pectoralis major, deltoids, interosseous, trapezius, supraspinatus, infraspinatus, quadriceps, gastrocnemius), and severe depletion of subcutaneous fat loss (orbital fat pads, buccal fat pads, upper arm/triceps, thoracic/lumbar region).         Meryle Ready, RDN      If physician disagrees with this assessment see addendum.

## 2023-09-02 NOTE — Procedures (Signed)
BRIEF POST- PROCEDURE NOTE      PROCEDURE:     CT guided left chest tube placement    PROCEDURALIST: J. Carole Binning, ScD, MD    ANESTHESIA:     Moderate sedation  Local    Moderate sedation was provided by the interventional radiology nurse under my direct supervision for 30 minutes.    POST-OPERATIVE DIAGNOSIS:     Pneumothorax    OPERATIVE FINDINGS:     Left anterior pneumothorax    SPECIMENS REMOVED:     12 Fr nonlocking catheter placed    ESTIMATED BLOOD LOSS:     No significant bleed    COMPLICATIONS:     None immediately    Signed by Julieta Gutting, MD.

## 2023-09-02 NOTE — H&P (Signed)
RADIOLOGY PRE PROCEDURE  NOTE    Proceduralist Comments:   Review of Systems, Allergies, Medications, and Past Medical / Surgical History performed: YES    Indications: Left PTX    Planned Procedure: CT guided left sided chest tube placement    Previous Adverse Reaction to Anesthesia or Sedation (if yes, describe): NO    Physical Exam / Laboratory Data (If applicable)   Airway Classification:  2    Neuro: Alert and cooperative  Lungs: Lungs clear to auscultation  Cardiac: RRR, normal S1S2.    Abdomen: Soft, non tender. Normal active bowel sounds  Other:       American Society of Anesthesiologists (ASA) Physical Status Classification:   ASA GRADE:  2    Planned Sedation:   MODERATE SEDATION    Attestation:   Henok Heacock has been reassessed immediately prior to the procedure and is an appropriate candidate for the planned sedation and procedure. Risks, benefits and alternatives to the planned procedure and sedation have been explained to the patient or guardian: YES        Signed by: Julieta Gutting, MD, ScD, MD

## 2023-09-02 NOTE — Progress Notes (Signed)
Initial Case Management Assessment and Discharge Planning [facility]   Patient Name: [name] [name], [name] [name]   Date of Birth 1951-02-03   Attending Physician: Florene Glen, MD   Primary Care Physician: Oswaldo Done, MD   Length of Stay 0   Reason for Consult / Chief Complaint Discharge planning         Situation   Admission DX:   1. Pneumothorax, left    2. Pneumothorax        A/O Status: X 3    LACE Score: 5    Patient admitted from: ER  Admission Status: inpatient    Health Care Agent: Self  Name: Jonni Sanger  Phone number: [phone]       Background     Advanced directive:   Received    has an advance directive - a copy HAS been provided    Code Status:   NO CPR - SUPPORT OK     Residence: Condo    PCP: Oswaldo Done, MD  Patient Contact:   [phone] (home)     [phone] (mobile)     Emergency contact:   Extended Emergency Contact Information  Primary Emergency Contact: Thuy,Amy  Mobile Phone: [phone]  Relation: Daughter  Preferred language: English  Interpreter needed? No  Secondary Emergency Contact: Carrero,Thai  Address: [address]           [place], [place] [address] [place] Phone: [phone]  Relation: Son  Interpreter needed? No      ADL/IADL's: Assistive Device  Previous Level of function: 6 Modified Independent     DME: None    Pharmacy:     [facility] [address], [place] - [address]  [address] [place] [address]  Phone: [phone] Fax: [phone]    [id number] - [place], [place] - [address], [place] [address] [facility]  [address], [address], [place] [address]  Phone: [phone] Fax: [phone]      Prescription Coverage: Yes    Home Health: The patient is not currently receiving home health services.    Previous SNF/AR: None    COVID Vaccine Status: NA    Date First IMM given: 09/01/2023   UAI on file?: No  Transport for discharge? Mode of transportation: Sales executive -  Family/Friend to drive patient  Agreeable to Home with family post-discharge:  Yes     Assessment   Pt sleeping Spoke with dtr Amy by Phone Introduced self and CM role the patient live with dtr in family in single level condo no steps to enter She reports pt sometimes need asssit with ADLs family supports as able .CM will cont to follow for needs   BARRIERS TO DISCHARGE: None noted      Recommendation   D/C Plan A: Home with family    D/C Plan B: Home with family and Home with home health    D/C Plan C: SNF          09/02/23 1601   Prior to admission   Home Layout One level   Living Arrangements Children   How do you get to your MD appointments? family   How do you get your groceries? family   Who picks up your prescriptions? family   Dressing Independent   Discharge Planning   Mode of transportation: Private car (family member)   Family and PCP   PCP  on file was verified as the current PCP? Yes   In case you are admitted, transferred or discharged, would like family notified? Yes   Name of family member to be notified Amy Chelsea Aus Dtr   In case you are admitted, transferred or discharged, would like your PCP notified? Yes   Important Message from [facility] Notice   Patient received 1st IMM Letter? Yes   Date of 1st IMM letter 09/01/23     Simmie Davies RN   Case Manager   [facility]   [phone]

## 2023-09-02 NOTE — Progress Notes (Addendum)
Admitted from ED: A&Ox4. VSS-2L NC for comfort; SOB. SBA w/ walker. I-locked. NPO for CT placement today.     4 eyes in 4 hours pressure injury assessment note:      Completed with: Candice, RN   Unit & Time admitted: [facility], 0300             Bony Prominences: Check appropriate box; if wound is present enter wound assessment in LDA     Occiput:                 [x] WNL  []  Wound present  Face:                     [x] WNL  []  Wound present  Ears:                      [x] WNL  []  Wound present  Spine:                    [x] WNL  []  Wound present  Shoulders:             [x] WNL  []  Wound present  Elbows:                  [x] WNL  []  Wound present  Sacrum/coccyx:     [x] WNL  []  Wound present  Ischial Tuberosity:  [x] WNL  []  Wound present  Trochanter/Hip:      [x] WNL  []  Wound present  Knees:                   [x] WNL  []  Wound present  Ankles:                   [x] WNL  []  Wound present  Heels:                    [x] WNL  []  Wound present  Other pressure areas:  []  Wound location       Device related: []  Device name:         LDA completed if wound present: yes/no  Consult WOCN if necessary  Blanchable heels, scattered scars      Other skin related issues, ie tears, rash, etc, document in Integumentary flowsheet

## 2023-09-02 NOTE — Plan of Care (Signed)
Problem: Inadequate Gas Exchange  Goal: Adequate oxygenation and improved ventilation  Outcome: Progressing  Flowsheets (Taken 09/02/2023 2027)  Adequate oxygenation and improved ventilation:   Assess lung sounds   Monitor SpO2 and treat as needed   Monitor and treat ETCO2   Provide mechanical and oxygen support to facilitate gas exchange   Position for maximum ventilatory efficiency   Increase activity as tolerated/progressive mobility   Plan activities to conserve energy: plan rest periods   Teach/reinforce use of incentive spirometer 10 times per hour while awake, cough and deep breath as needed   Consult/collaborate with Respiratory Therapy  Goal: Patent Airway maintained  Outcome: Progressing  Flowsheets (Taken 09/02/2023 2027)  Patent airway maintained:   Position patient for maximum ventilatory efficiency   Provide adequate fluid intake to liquefy secretions   Suction secretions as needed   Reinforce use of ordered respiratory interventions (i.e. CPAP, BiPAP, Incentive Spirometer, Acapella, etc.)   Reposition patient every 2 hours and as needed unless able to self-reposition     Problem: Pain interferes with ability to perform ADL  Goal: Pain at adequate level as identified by patient  Outcome: Progressing  Flowsheets (Taken 09/02/2023 2027)  Pain at adequate level as identified by patient:   Identify patient comfort function goal   Assess for risk of opioid induced respiratory depression, including snoring/sleep apnea. Alert healthcare team of risk factors identified.   Assess pain on admission, during daily assessment and/or before any "as needed" intervention(s)   Reassess pain within 30-60 minutes of any procedure/intervention, per Pain Assessment, Intervention, Reassessment (AIR) Cycle   Evaluate if patient comfort function goal is met   Evaluate patient's satisfaction with pain management progress   Offer non-pharmacological pain management interventions   Consult/collaborate with Physical Therapy,  Occupational Therapy, and/or Speech Therapy   Include patient/patient care companion in decisions related to pain management as needed     Problem: Side Effects from Pain Analgesia  Goal: Patient will experience minimal side effects of analgesic therapy  Outcome: Progressing  Flowsheets (Taken 09/02/2023 2027)  Patient will experience minimal side effects of analgesic therapy:   Monitor/assess patient's respiratory status (RR depth, effort, breath sounds)   Assess for changes in cognitive function   Prevent/manage side effects per LIP orders (i.e. nausea, vomiting, pruritus, constipation, urinary retention, etc.)   Evaluate for opioid-induced sedation with appropriate assessment tool (i.e. POSS)     Problem: Safety  Goal: Patient will be free from injury during hospitalization  Outcome: Progressing  Flowsheets (Taken 09/02/2023 2027)  Patient will be free from injury during hospitalization:   Assess patient's risk for falls and implement fall prevention plan of care per policy   Provide and maintain safe environment   Use appropriate transfer methods   Ensure appropriate safety devices are available at the bedside   Include patient/ family/ care giver in decisions related to safety   Assess for patients risk for elopement and implement Elopement Risk Plan per policy   Provide alternative method of communication if needed (communication boards, writing)  Goal: Patient will be free from infection during hospitalization  Outcome: Progressing  Flowsheets (Taken 09/02/2023 2027)  Free from Infection during hospitalization:   Assess and monitor for signs and symptoms of infection   Monitor lab/diagnostic results   Monitor all insertion sites (i.e. indwelling lines, tubes, urinary catheters, and drains)   Encourage patient and family to use good hand hygiene technique     Problem: Moderate/High Fall Risk Score >5  Goal: Patient  will remain free of falls  Outcome: Progressing  Flowsheets (Taken 09/02/2023 0900)  High (Greater  than 13):   HIGH-Visual cue at entrance to patient's room   HIGH-Bed alarm on at all times while patient in bed   HIGH-Utilize chair pad alarm for patient while in the chair   HIGH-Apply yellow "Fall Risk" arm band   HIGH-Pharmacy to initiate evaluation and intervention per protocol   HIGH-Initiate use of floor mats as appropriate   HIGH-Consider use of low bed

## 2023-09-02 NOTE — ED Provider Notes (Signed)
[name] EMERGENCY DEPARTMENT  ATTENDING PHYSICIAN HISTORY AND PHYSICAL EXAM     Patient Name: [name] [name]  Department:[facility] EMERGENCY DEPT  Encounter Date:  09/01/2023  Attending Physician: Devin Going, MD   Age: 72 y.o. male  Patient Room: S 07/S 07  PCP: Oswaldo Done, MD           Diagnosis/Disposition:     Final diagnoses:   Pneumothorax, left       ED Disposition       ED Disposition   Admit    Condition   --    Date/Time   Fri Sep 02, 2023  1:03 AM    Comment   Admitting Physician: Jules Schick [[id number]]   Service:: Medicine [[id number]]   Estimated Length of Stay: > or = to 2 midnights   Tentative Discharge Plan?: Home or Self Care [1]   Does patient need telemetry?: Yes   Is patient 18 yrs or greater?: Yes   Telemetry type (separate Telemetry order is also required):: Adult telemetry   Was admission to another facility considered?: No   Detail:: [facility] resource required                 Follow-Up Providers (if applicable)    No follow-up provider specified.     New Prescriptions    No medications on file           Medical Decision Making:     Initial Differential Diagnosis:  Initial differential diagnosis to include but not limited to: PTX, known    Plan:  72 y/o male with known PTX  transferred here from [name]  Seen by thoracic surgery  team, recommending image guided chest tube placement in the morning  Breathing comfortably on 2L NC with no hypoxia  Medicine accepting    Final Impression:  Left PTX  The patient was admitted and handed off to hospitalist. We discussed all aspects of the work-up that had been completed in the ED, any pending studies/therapies, and all clinical decision making at the time care was handed off.          Medical Decision Making  Risk  Decision regarding hospitalization.      Records Reviewed (internal and external)? : recent external ER records including visits for PTX        History of Presenting Illness:     Nursing Triage note: BIBA transfer from [name] with approximately 1 week  of SOB in context of left sided spontaneous PTX. Hypoxic in RA improved on 2L of O2. Pt can speak in short sentences. Sternum midline. Strong regular and equal peripheral pulses.  Chief complaint: Shortness of Breath    [name] [name] is a 72 y.o. male with PMHx of Emphysema, COPD, Collapsed Lung, HTN, Aortic Aneurysm, and BPH presents for an evaluation of SOB onset 1 week. Patient is a transfer patient from [facility] to be further evaluated by Thoracic Surgery for pneumothorax. HPI provided by patient, son, and daughter.     For the past week patient reports to a gradual progression of SOB upon physical exertion. He states he's experienced episodes of SOB in the past, noting 6 months ago he was diagnosed with a right sided pneumothorax.     Associated symptoms include intermittent cough. Hemoptysis not reported.     Social hx: former smoker, cessation in 2023.   Denies fever, chills, chest pain, abdominal pain, or n/v/d.     No further medical concerns or complaints reported  at this time.      Review of Systems:  Physical Exam:     Review of Systems    Positive and negative ROS per above and in HPI. All other systems reviewed and negative.     Triage Vitals: Pulse 93  BP 121/80  Resp (!) 24  SpO2 100 %  Temp 98.5 F (36.9 C)     Physical Exam  Vitals and nursing note reviewed.   Constitutional:       Comments: Frail appearing   HENT:      Head: Normocephalic and atraumatic.      Nose: Nose normal.      Mouth/Throat:      Mouth: Mucous membranes are moist.   Eyes:      Extraocular Movements: Extraocular movements intact.      Pupils: Pupils are equal, round, and reactive to light.   Cardiovascular:      Rate and Rhythm: Normal rate and regular rhythm.   Pulmonary:      Effort: Pulmonary effort is normal. No respiratory distress.      Breath sounds: No wheezing.   Musculoskeletal:      Cervical back: Normal range of motion and neck supple.      Right lower leg: No edema.      Left lower leg: No edema.    Skin:     General: Skin is warm and dry.      Findings: No rash.   Neurological:      Mental Status: He is alert and oriented to person, place, and time.                 Interpretations, Clinical Decision Tools and Critical Care:         O2 Sat:  The patient's oxygen saturation was 100 % on 2L NC. This was independently interpreted by me as borderline normal.   EKG: I reviewed and Independently interpreted the patient's EKG as               Procedures:   Procedures      Attestations:     Scribe Attestation: I was acting as a Neurosurgeon for Jules Schick, MD on [name],[name]   Treatment Team: Scribe: Garrison Columbus C         Documentation Notes:  Parts of this note were generated by the Epic EMR system/ Dragon speech recognition and may contain inherent errors or omissions not intended by the user. Grammatical errors, random word insertions, deletions, pronoun errors and incomplete sentences are occasional consequences of this technology due to software limitations. Not all errors are caught or corrected.  My documentation is often completed after the patient is no longer under my clinical care. In some cases, the Epic EMR may pull updated results into the above documentation which may not reflect all results or information that were available to me at the time of my medical decision making.   If there are questions or concerns about the content of this note or information contained within the body of this dictation they should be addressed directly with the author for clarification.                  Devin Going, MD  09/02/23 [phone]

## 2023-09-02 NOTE — UM Notes (Signed)
09/02/23 0103  Adult Admit to Inpatient ([facility] Only)  Once         09/02/23 0103     Inpatient review  Unit: Short stay/ [facility]    [name] [name]  25-Aug-1951    72 year old male with past medical history of BPH s/p TURP, COPD, anorexia, aortic aneurysm, cachexia, hypertension, right-sided pneumothorax 2023 referred from PCP office to ED with shortness of breath, worsening fatigue and refusal to eat.  Found to have left-sided pneumothorax and is being admitted for further management  Tachycardic to 100, afebrile, blood pressure 133/83, satting well on room air but placed on 2 L for comfort  CBC unremarkable, BMP unremarkable, no transaminitis  CT chest with moderate left-sided pneumothorax approximately 25% of the volume, severe bullous pulmonary emphysema, Staghorn right calculus and infrarenal abdominal aortic aneurysm, chest x-ray with new left-sided pneumothorax    VS: 98, 132, 20, 142/91, 92%  CXR: New left-sided pneumothorax.   CT Chest wo contrast: Moderate left-sided pneumothorax approximately 25 % of the volume. These critical findings earlier communicated on the radiograph interpretation. Severe bullous pulmonary emphysema.  Redemonstration of staghorn right nephrolithiasis an infrarenal abdominal  aortic aneurysm.     Plan:  #Failure to thrive  # Progressively worsening cachexia possibly related to end-stage COPD  # Generalized weakness secondary to above  -Nutrition consult  -Will obtain UA to rule out UTI being the cause of above given reporting some urinary frequency  -Patient will benefit from age-appropriate cancer screening  -consider consulting palliative care in am to introduce hospice      # Severe bullous pulmonary emphysema  # History of COPD  # Acute moderate left-sided pneumothorax, satting well on room air  -Placed on 2 L for comfort  -Thoracic surgery consulted by ED and recommended image guided chest tube placement in a.m.  -Telemetry monitoring  -regular diet  -Pulmonology consulted and epic chat  sent      # Hypertension  - 24hr BP range: (115-179) / (76-114) (09/01/23)  -Continue home: amlodipine 2.5 mg po     No CPR with support  Lovenox subcu  Regular diet    CARE DAY 2 September 02, 2023    On 2L O2 via NC   BP (!) 156/96   Pulse 95   Temp 98.4 F (36.9 C) (Oral)   Resp 20   Ht 1.6 m (5\' 3" )   Wt 36.7 kg (81 lb)   SpO2 99%   BMI 14.35 kg/m     Labs: WNL     Current Facility-Administered Medications   Medication Dose Route Frequency    amLODIPine  2.5 mg Oral Daily     Continuous Infusions:  PRN Meds:.acetaminophen, benzocaine-menthol, benzonatate, carboxymethylcellulose sodium, dextrose **OR** dextrose **OR** dextrose **OR** glucagon (rDNA), magnesium sulfate, melatonin, naloxone, potassium & sodium phosphates, potassium chloride **OR** potassium chloride **OR** potassium chloride, saline    Per Thoracic surgery:  Recommend Image guided Chest tube placement in the AM with CT body  - Will discuss possible VATS this admission  - rest of care per primary    Pollyann Kennedy, RN, BSN, ACM   UR Case Manager   Utilization Review  [facility]   [address]  [address], [address]  [place], [place] [address]  Phone : [phone]   [place] [phone]  Fax: [phone]    [email or web address]

## 2023-09-02 NOTE — Nursing Progress Note (Addendum)
Neuro: A/Ox3, glasses, drowsy still after s/p   CV: Cont tele, NSR  Pulm: Cont pulse ox, 2L O2 via NC, SpO2 100%, CT to continuous suction -20cmH2O  GI: regular diet, tolerate slight after s/p; Last BM: today 10/4 before CT insertion s/p today  GU: void bedside urinals or restroom  Skin: CT anterior chest L side cover gauze and tegaderm, scatter scars and bruises  MUSK: SBAx1 with walker  IV:  L forearm  Pain: pt report pain score 10 after s/p, give pt PRN Dilaudid 0.4mg  IV and Oxycodone 5mg  and Lidocaine patch    Shift Event:   -pt underwent s/p CT guided L side Chest Tube palcement    Plan:   -monitor pt for pain, Tx accordingly  -encourage pt ambulate OOB and use IS  -monitor CT atria and CT dressing site  -continue CT to suction  -Daily CXR AM

## 2023-09-02 NOTE — Consults (Signed)
SURGICAL CONSULTATION  Team 2: Thoracic Surgery, [facility] [phone]    Date Time: 09/02/23 1:26 AM  Patient Name: [name] [name],[name] [name]  Attending Physician: Jules Schick, MD  Consulting Physician:  Tera Mater, MD  Reason for consultation: left pneumothorax    History of Present Illness:   [name] [name] is a 72 y.o. male with Pmhx of f BPH with obstruction [s/p TURP cystolitholopaxy], COPD, [60 pack-year history, smoking cessation 2023], anorexia, aortic aneurysm, cachexia with progressive weight loss, hypertension [not compliant with medication], prior Right-sided ptx (2023) who presented to PCP clinic today with his daughter with complaints of worsening fatigue, difficulty breathing, refusal of feeds. Pt daughter reports progressively worsening fatigue and SOB over the last few weeks. Pt sent to OSH by PCP, who then transferred him here.     In the ED, pt AF, HR 93-132. WBC 6.5, BG 190. CT chest shows moderate left-sided ptx approximately 25% volume.     Past Medical History:     Past Medical History:   Diagnosis Date    Bilateral cataracts 01/12/2023    Collapsed lung     Emphysema lung     Enlarged prostate        Past Surgical History:   Past Surgical History[1]    Family History:   Family History[2]    Social History:     Social History     Socioeconomic History    Marital status: Legally Separated     Spouse name: Not on file    Number of children: Not on file    Years of education: Not on file    Highest education level: Not on file   Occupational History    Occupation: retired     Comment: production line   Tobacco Use    Smoking status: Former     Current packs/day: 0.00     Average packs/day: 0.3 packs/day for 50.0 years (12.5 ttl pk-yrs)     Types: Cigarettes     Start date: 06/26/1972     Quit date: 06/26/2022     Years since quitting: 1.1    Smokeless tobacco: Never   Vaping Use    Vaping status: Never Used   Substance and Sexual Activity    Alcohol use: Never    Drug use: Never    Sexual activity: Not on file   Other  Topics Concern    Not on file   Social History Narrative    Lives with son and daughter    Has daughter in Social worker.      Social Determinants of Health     Financial Resource Strain: Low Risk  (09/01/2023)    Overall Financial Resource Strain (CARDIA)     Difficulty of Paying Living Expenses: Not hard at all   Food Insecurity: No Food Insecurity (09/01/2023)    Hunger Vital Sign     Worried About Running Out of Food in the Last Year: Never true     Ran Out of Food in the Last Year: Never true   Transportation Needs: No Transportation Needs (09/01/2023)    PRAPARE - Therapist, art (Medical): No     Lack of Transportation (Non-Medical): No   Physical Activity: Inactive (09/01/2023)    Exercise Vital Sign     Days of Exercise per Week: 0 days     Minutes of Exercise per Session: 0 min   Stress: Stress Concern Present (09/01/2023)    [employer] of Occupational Health - Occupational  Stress Questionnaire     Feeling of Stress : To some extent   Social Connections: Socially Isolated (09/01/2023)    Social Connection and Isolation Panel [NHANES]     Frequency of Communication with Friends and Family: More than three times a week     Frequency of Social Gatherings with Friends and Family: More than three times a week     Attends Religious Services: Never     Database administrator or Organizations: No     Attends Banker Meetings: Never     Marital Status: Separated   Intimate Partner Violence: Not At Risk (09/01/2023)    Humiliation, Afraid, Rape, and Kick questionnaire     Fear of Current or Ex-Partner: No     Emotionally Abused: No     Physically Abused: No     Sexually Abused: No   Housing Stability: Low Risk  (09/01/2023)    Housing Stability Vital Sign     Unable to Pay for Housing in the Last Year: No     Number of Times Moved in the Last Year: 0     Homeless in the Last Year: No       Allergies:   Allergies[3]    Medications:     Prior to Admission medications    Medication Sig Start  Date End Date Taking? Authorizing Provider   amLODIPine (NORVASC) 2.5 MG tablet Take 1 tablet (2.5 mg) by mouth daily 02/23/23   Sioco, Crissie Sickles III, MD       Review of Systems:   Negative except as above    Physical Exam:     Vitals:    09/02/23 0010   BP: 133/83   Pulse: 99   Resp: 18   Temp:    SpO2: 100%            Intake and Output Summary (Last 24 hours) at Date Time  No intake or output data in the 24 hours ending 09/02/23 0126      General appearance - alert, well appearing, and in no distress  Mental status - alert, oriented to person, place, and time  Eyes - pupils equal and reactive, extraocular eye movements intact  Chest -Pt sat 99% on 2L O2  Heart - normal rate and regular rhythm  Abdomen - soft, nontender  Extremities - peripheral pulses normal, no pedal edema, no clubbing or cyanosis    Labs:     Results       ** No results found for the last 24 hours. **            Rads:     Radiology Results (24 Hour)       ** No results found for the last 24 hours. **            Problem List:   Problem List[4]    Assessment:   72 y.o. male with hx COPD and previous R-sided pneumothorax who presents with left-sided pneumothorax currently stable and satting well on 2L NC.       Patient has BMI=Body mass index is 14.35 kg/m.  Diagnosis: Cachexia       Plan:   - Recommend Image guided Chest tube placement in the AM with CT body  - Will discuss possible VATS this admission  - rest of care per primary    Discussed with Dr. Don Broach, MD  General Surgery PGY3      Physical Exam:  GEN: Well developed, well nourished, no acute distress.  HEENT: Anicteric conjunctiva  NECK: Supple, full range of motion.   PULM: Clear to auscultation bilaterally, good excursion. No rales, rhonchi, wheezes.   CV: RRR      This is a 72 yo M w/ recurrent, secondary PTX (first episode on R, currently on L).   This is moderate, and he has some symptoms.   We will plan for a pigtail.   We will discuss the role of talc  pleurodesis also.       I performed the substantive portion of this visit by personally conducting the physical exam, review of pertinent images/history and formulating management plans in  its entirety. Patient was also seen by resident for the H&P and physical exam portion of   the visit. I reviewed and updated the documented findings and plan of care accordingly.      Tera Mater         [1]   Past Surgical History:  Procedure Laterality Date    CYSTOSCOPY, LITHOLAPAXY N/A 01/04/2020    Procedure: Gabriel Rainwater;  Surgeon: Miquel Dunn, MD;  Location: [facility];  Service: Urology;  Laterality: N/A;  CYSTOLITHOLAPAXY    CYSTOSCOPY, TURP N/A 09/27/2022    Procedure: CYSTOSCOPY, TRANSURETHRAL RESECTION OF PROSTATE , TURP, CYSTOLITHOPLAXY;  Surgeon: Miquel Dunn, MD;  Location: [facility];  Service: Urology;  Laterality: N/A;    PROSTATE SURGERY  2019   [2]   Family History  Problem Relation Name Age of Onset    No known problems Mother      No known problems Father     [3] No Known Allergies  [4]   Patient Active Problem List  Diagnosis    Bladder stone    Urinary retention    Benign prostatic hyperplasia with lower urinary tract symptoms    Right kidney stone    Ascending aorta dilation    History of smoking    BMI less than 19,adult    Abnormal weight    Pulmonary emphysema, unspecified emphysema type    Coronary artery calcification seen on CT scan    Benign prostatic hyperplasia with urinary obstruction    History of pneumothorax    Primary hypertension    Pneumothorax    Pneumothorax, left

## 2023-09-02 NOTE — Consults (Signed)
Nutrition:  Reason for Assessment: cachexia    Recommendations:  Continue regular diet; encourage po intake of small, frequent meals/snacks/sips as tolerated  Family to bring in food from home that pt enjoys  Add multivitamin with minerals, thiamine 100 mg po daily for malnutrition  Ensure Plus High Protein (vanilla) BID  Consider appetite stimulant as medically appropriate    Assessment: [name] [name] is a(n) 72 y.o. male w/PMH of BPH s/p TURP, COPD, anorexia, aortic aneurysm, cachexia, hypertension, right-sided pneumothorax 2023 referred from PCP office to ED with shortness of breath, worsening fatigue and refusal to eat. Pt found to have left-sided pneumothorax. Chest tube placed this afternoon. Pt in pain. Spoke to pt at bedside, daughter presented and interpreted. Pt w/no po intake x last 3 days, with progressive poor appetite/po intake over last several weeks d/t extreme fatigue and poor dentition. Pt UBW is 86 lb, now down to 81 lb in last 1 month. NFPE showed severe muscle/fat loss. Discussed vitamin supplementation as well as oral nutrition supplement options. Daughter reported pt has been refusing to eat/drink most things, but amenable to trial vanilla Ensure. Pt occasionally drinks protein shakes at home. Family has been providing protein bars/shakes, etc but pt has not been consuming them.     Nutrition assessment completed and at this time the patient meets the Academy of Nutrition and Dietetics & the American Society for Parenteral and Enteral Nutrition's criteria for diagnosis of adult malnutrition.       Current Diet Order    Orders Placed This Encounter   Procedures    Adult diet Regular       Anthropometrics:  Height: 160 cm (5\' 3" )  Weight: 36.7 kg (81 lb)  Body mass index is 14.35 kg/m.  IBW: 56.3 kg (124 lb)    %Weight change: 5 lb (5.8%) wt loss over last 1 month per family report   Weight Weight Method   02/09/2023 37.921 kg !     02/16/2023 38.556 kg !     02/23/2023 38.828 kg !     05/24/2023 38.102  kg !     09/01/2023 36.741 kg        Legend:  ! Abnormal    Nutrition-Focused Physical Findings:   Head: temple region: hollowing, scooping, depression with little to no muscle tone/resistance (severe muscle loss - temporalis), orbital region: hollow look, depressions, dark circles, loose skin, significant decrease in bounce back of fat pads (severe fat loss), and buccal region: hollow, sunken, narrow cheeks, prominence of bony structure, minimal to no bounce back of fat pads (severe fat loss)  Upper Body: clavicle bone region: protruding, prominent bone, no bounce back in muscle tone/resistance with striated/stringy feel, fingers able to palpate under clavicle (severe muscle loss - pectoralis major), shoulder and acromion bone region: prominent protrusion of acromion process and bones, squared off appearance of shoulders, minimal to no muscle tone/resistance (severe muscle loss - deltoid), upper arm region: fingers touch with minimal fat, very little space between (severe fat loss), dorsal hand region: deep depression, minimal to no muscle tone/resistance (severe muscle loss - interosseous), scapular bone region: prominent, visible bones, depressions around scapula bone with minimal to no resistance in muscles (severe muscle loss - trapezius, supraspinatus, infraspinatus), and thoracic/lumbar region: depression between ribs very apparent, minimal to no fat can be pinched, Iliac Crest very prominent (severe fat loss)  Lower Body: anterior thigh and patellar region: noticeable depressions along the thigh, patella prominent, square appearance, minimal to no muscle tone/resistance in quadriceps  to patella (severe muscle loss - quadriceps) and posterior calf region: absence of bulb shape, minimal to no muscle tone/resistance (severe muscle loss - gastrocnemius)  Edema: none noted  Skin: scattered scars  Abdomen/GI Fxn: LBM 10/4    Pertinent Labs: reviewed, WNL  Pertinent Meds: amlodipine    Estimated Nutrition Needs:  using CBW 36.7 kg  1468-1651 kcal/day (40-45 kcal/kg)  44-55 gm protein/day (1.2-1.5 gm protein/kg)  Fluids per team     Nutrition Diagnosis:   Severe Malnutrition--related to inadequate protein energy intake in the setting of acute illness  as evidenced by  intake < 50% of estimated energy requirements for > 5 days, >5% weight loss x 1 month, severe muscle depletion (temporalis, pectoralis major, deltoids, interosseous, trapezius, supraspinatus, infraspinatus, quadriceps, gastrocnemius), and severe depletion of subcutaneous fat loss (orbital fat pads, buccal fat pads, upper arm/triceps, thoracic/lumbar region).       Intervention:  Add oral nutrition supplement Ensure Plus High Protein to optimize nutrition in the setting of malnutrition.  Initiate vitamin/mineral supplementation multivitamin with minerals, thiamine 100 mg po daily   Check Weight standing scale         Goals: To consume and tolerate at least 75% of meal trays by next RD intervention.    Monitor/Eval: Med tx plan, PO intake/Nutrition support goals, GI, labs, meds, weight    Meryle Ready, RD, CNSC  [facility] [phone]  Main RD office [phone]   Available via Secure Chat

## 2023-09-03 ENCOUNTER — Inpatient Hospital Stay: Payer: Medicare Other

## 2023-09-03 ENCOUNTER — Other Ambulatory Visit: Payer: Self-pay

## 2023-09-03 DIAGNOSIS — J939 Pneumothorax, unspecified: Secondary | ICD-10-CM

## 2023-09-03 LAB — URINALYSIS WITH REFLEX TO MICROSCOPIC EXAM - REFLEX TO CULTURE
Urine Bilirubin: NEGATIVE
Urine Blood: NEGATIVE
Urine Glucose: NEGATIVE
Urine Ketones: NEGATIVE mg/dL
Urine Nitrite: NEGATIVE
Urine Specific Gravity: 1.023 (ref 1.001–1.035)
Urine Urobilinogen: NORMAL mg/dL (ref 0.2–2.0)
Urine pH: 6 (ref 5.0–8.0)

## 2023-09-03 MED ORDER — TALC 4 G PL POWD
4.0000 g | Freq: Once | INTRAVENOUS | Status: AC
Start: 2023-09-03 — End: 2023-09-04
  Administered 2023-09-04: 4 g via INTRAPLEURAL
  Filled 2023-09-03: qty 1

## 2023-09-03 NOTE — Progress Notes (Signed)
Thoracic Surgery Daily Progress Note  Team Spectra: [phone]        Assessment:   [name] [name] is a 72 y.o. male w/ recurrent secondary PTX now s/p CT guided chest tube placement.      Plan:   - Chest tube to water seal, F/u CXR at 12pm   - Possible plan for bedside talc pleurodesis tomorrow   - Multimodal pain control  - Continue Ambulation, at least 10 laps in the hallway per shift  - Incentive Spirometry Use 10X/hr while awake    Patient seen and D/W Dr. Elliot Dally    Interval History:   24 hour Events: CT guided chest tube placed     Subjective: Feels better today. Denies CP/SOB.     Physical Exam:   Current Vitals:   BP 137/79   Pulse 90   Temp 97.4 F (36.3 C) (Oral)   Resp 17   Ht 1.6 m (5\' 3" )   Wt 36.7 kg (81 lb)   SpO2 96%   BMI 14.35 kg/m       GEN: NAD  HEENT: Anicteric conjunctiva, pupils reactive  NECK: Supple, good range of motion.    PULM: good respiratory effort, on 1L O2.   CV: RRR, no m/r/g appreciated   ABD: Soft, non-tender, non-distended  EXT: Warm, no edema  NEURO: alert, oriented, able to answer questions appropriately  PSYCH: Appropriate mood and affect  SKIN: No diffuse rashes        Lines/Drains:   Chest Tube: 1mL of serosanguinous drainage in past 24 hours. -Airleak.     Laboratory and Radiological Results:   Morning CXR reviewed.    Lab Results   Component Value Date    WBC 7.12 09/02/2023    HGB 13.9 09/02/2023    HCT 44.1 09/02/2023    MCV 89.3 09/02/2023    PLT 279 09/02/2023     Lab Results   Component Value Date    NA 143 09/02/2023    K 4.2 09/02/2023    CL 109 09/02/2023    CO2 24 09/02/2023    BUN 18 09/02/2023    CREAT 0.8 09/02/2023    MG 1.8 05/03/2022         XR Chest AP Portable    Result Date: 09/02/2023   Left pigtail catheter with decreased now small hydropneumothorax. Charlott Rakes, MD 09/02/2023 5:31 PM    CT Guided Insert Pleural Cath W/Image    Result Date: 09/02/2023   Technically successful left pleural catheter placement. Kristine Linea, MD 09/02/2023 2:03 PM

## 2023-09-03 NOTE — Nursing Progress Note (Signed)
Neuro: A/Ox4, wear glasses  CV: Cont tele, Sinus Brady and NSR  Pulm: Cont pulse ox, 1L O2 NC, CT L anterior side chest change from suction to water seal during shift, 7cc serosanguinous output during shift, IS 500  GI: regular diet, tolerate well, Last BM: 10/4, passing gas  GU: void bedside urinals  Skin: CT pigtail insertion site L anterior side chest gauze and tegaderm, scatter scars and bruises   MUSK: SBAx1 with IV pole and 2L O2 NC when ambulate, walk 5 laps during shift   IV: L forearm   Pain: pt report high pain, given Dilaudid 0.4mg    IV and Oxycodone 5mg  and heat packs per pt said help relieve pain       Plan:   -monitor pt pain and Tx accordingly   -encourage pt ambulate OOB and use IS  -plan possible bedside talc pleurodesis tomorrow, 10/6  -daily AM CXR

## 2023-09-03 NOTE — Plan of Care (Signed)
Problem: Moderate/High Fall Risk Score >5  Goal: Patient will remain free of falls  09/03/2023 0348 by Raymond Gurney, RN  Outcome: Progressing  Flowsheets (Taken 09/03/2023 0348)  High (Greater than 13):   HIGH-Visual cue at entrance to patient's room   HIGH-Bed alarm on at all times while patient in bed   HIGH-Utilize chair pad alarm for patient while in the chair   HIGH-Apply yellow "Fall Risk" arm band   HIGH-Pharmacy to initiate evaluation and intervention per protocol   HIGH-Initiate use of floor mats as appropriate   HIGH-Consider use of low bed  09/03/2023 0348 by Raymond Gurney, RN  Outcome: Progressing     Problem: Safety  Goal: Patient will be free from injury during hospitalization  09/03/2023 0348 by Raymond Gurney, RN  Outcome: Progressing  Flowsheets (Taken 09/03/2023 [phone])  Patient will be free from injury during hospitalization:   Assess patient's risk for falls and implement fall prevention plan of care per policy   Provide and maintain safe environment   Use appropriate transfer methods   Ensure appropriate safety devices are available at the bedside   Include patient/ family/ care giver in decisions related to safety   Assess for patients risk for elopement and implement Elopement Risk Plan per policy   Hourly rounding  09/03/2023 0348 by Raymond Gurney, RN  Outcome: Progressing  Goal: Patient will be free from infection during hospitalization  09/03/2023 0348 by Raymond Gurney, RN  Outcome: Progressing  Flowsheets (Taken 09/03/2023 [phone])  Free from Infection during hospitalization:   Assess and monitor for signs and symptoms of infection   Monitor lab/diagnostic results   Encourage patient and family to use good hand hygiene technique   Monitor all insertion sites (i.e. indwelling lines, tubes, urinary catheters, and drains)  09/03/2023 0348 by Raymond Gurney, RN  Outcome: Progressing     Problem: Inadequate Gas Exchange  Goal: Adequate oxygenation and improved  ventilation  09/03/2023 0348 by Raymond Gurney, RN  Outcome: Progressing  Flowsheets (Taken 09/03/2023 [phone])  Adequate oxygenation and improved ventilation:   Monitor SpO2 and treat as needed   Monitor and treat ETCO2   Provide mechanical and oxygen support to facilitate gas exchange   Position for maximum ventilatory efficiency   Plan activities to conserve energy: plan rest periods   Assess lung sounds   Consult/collaborate with Respiratory Therapy   Increase activity as tolerated/progressive mobility   Teach/reinforce use of incentive spirometer 10 times per hour while awake, cough and deep breath as needed  09/03/2023 0348 by Raymond Gurney, RN  Outcome: Progressing  Goal: Patent Airway maintained  09/03/2023 0348 by Raymond Gurney, RN  Outcome: Progressing  Flowsheets (Taken 09/03/2023 [phone])  Patent airway maintained:   Position patient for maximum ventilatory efficiency   Provide adequate fluid intake to liquefy secretions   Reposition patient every 2 hours and as needed unless able to self-reposition   Reinforce use of ordered respiratory interventions (i.e. CPAP, BiPAP, Incentive Spirometer, Acapella, etc.)  09/03/2023 0348 by Raymond Gurney, RN  Outcome: Progressing     Problem: Pain interferes with ability to perform ADL  Goal: Pain at adequate level as identified by patient  09/03/2023 0348 by Raymond Gurney, RN  Outcome: Progressing  Flowsheets (Taken 09/03/2023 0348)  Pain at adequate level as identified by patient:   Identify patient comfort function goal   Assess pain on admission, during daily assessment and/or before any "as needed" intervention(s)  Reassess pain within 30-60 minutes of any procedure/intervention, per Pain Assessment, Intervention, Reassessment (AIR) Cycle   Evaluate if patient comfort function goal is met   Evaluate patient's satisfaction with pain management progress   Consult/collaborate with Physical Therapy, Occupational Therapy, and/or Speech  Therapy   Consult/collaborate with Pain Service   Offer non-pharmacological pain management interventions   Include patient/patient care companion in decisions related to pain management as needed  09/03/2023 0348 by Raymond Gurney, RN  Outcome: Progressing     Problem: Side Effects from Pain Analgesia  Goal: Patient will experience minimal side effects of analgesic therapy  09/03/2023 0348 by Raymond Gurney, RN  Outcome: Progressing  Flowsheets (Taken 09/03/2023 [phone])  Patient will experience minimal side effects of analgesic therapy:   Monitor/assess patient's respiratory status (RR depth, effort, breath sounds)   Assess for changes in cognitive function   Prevent/manage side effects per LIP orders (i.e. nausea, vomiting, pruritus, constipation, urinary retention, etc.)  09/03/2023 0348 by Raymond Gurney, RN  Outcome: Progressing

## 2023-09-03 NOTE — Progress Notes (Signed)
MEDICINE PROGRESS NOTE    Date Time: 09/03/23 5:42 PM  Patient Name: [name] [name]  Attending Physician: Florene Glen, MD    Assessment / Plan:   [name] [name] is a 72 year old male with past medical history of BPH s/p TURP, COPD, anorexia, aortic aneurysm, cachexia, hypertension, right-sided pneumothorax 2023 referred from PCP office to ED with shortness of breath, worsening fatigue and refusal to eat.  Found to have left-sided pneumothorax and is being admitted for further management. CT chest with moderate left-sided pneumothorax approximately 25% of the volume, severe bullous pulmonary emphysema, Staghorn right calculus and infrarenal abdominal aortic aneurysm.     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  - F/up UA with reflex     # Hypertension  - 24hr BP range: (115-179) / (76-114) (09/01/23)  -Continue home: amlodipine 2.5 mg po      Case discussed with: patient, RN    Additional Diagnoses:   Malnutrition Documentation    Severe Malnutrition--related to inadequate protein energy intake in the setting of acute illness  as evidenced by  intake < 50% of estimated energy requirements for > 5 days, >5% weight loss x 1 month, severe muscle depletion (temporalis, pectoralis major, deltoids, interosseous, trapezius, supraspinatus, infraspinatus, quadriceps, gastrocnemius), and severe depletion of subcutaneous fat loss (orbital fat pads, buccal fat  pads, upper arm/triceps, thoracic/lumbar region).             Safety Checklist:     DVT prophylaxis:  CHEST guideline (See page e199S) Chemical and / or mechanical ppx NOT indicated or contraindicated: Post-op     Foley:  [place] Rn Foley protocol Not present   IVs:  Peripheral IV   PT/OT: Not needed   Daily CBC & or Chem ordered:  SHM/ABIM guidelines (see #5) Will d/c as no longer needed   Reference for approximate charges of common labs: CBC auto diff - $76  BMP - $99  Mg - $79    Lines:     Patient Lines/Drains/Airways Status       Active PICC Line / CVC Line / PIV Line / Drain / Airway / Intraosseous Line / Epidural Line / ART Line / Line / Wound / Pressure Ulcer / NG/OG Tube       Name Placement date Placement time Site Days    Peripheral IV 09/01/23 Anterior;Distal;Left Forearm 09/01/23  0000  Forearm  2    Chest Tube Left Pleural 12 Fr. 09/02/23  1345  Pleural  1    Wound 09/27/22 Surgical Incision Penis Other (Comment) n/a 09/27/22  1022  Penis  341    Wound 09/27/22 Surgical Incision Scrotum Other (Comment) n/a 09/27/22  1022  Scrotum  341                     Disposition: (Please see PAF column for Expected D/C Date)   Today's date: 09/03/2023  Admit Date: 09/01/2023 11:47 PM  LOS: 1  Clinical Milestones: pending CT  Anticipated discharge needs: TBD      Subjective     CC: Pneumothorax, left    HPI/Subjective: feeling better with no SOB, CP. Urinary frequency resolved.     Review of Systems:     As per HPI    Physical Exam:     VITAL SIGNS PHYSICAL EXAM   Temp:  [97.4 F (36.3 C)-97.6 F (36.4 C)] 97.4 F (36.3 C)  Heart Rate:  [68-90] 68  Resp Rate:  [17-18] 17  BP: (98-158)/(67-88) 98/67        Intake/Output Summary (Last 24 hours) at 09/03/2023 1742  Last data filed at 09/03/2023 1430  Gross per 24 hour   Intake 221 ml   Output 748 ml   Net -527 ml    Physical Exam  General: awake, alert X 3, thin  Cardiovascular: regular rate and rhythm, no murmurs, rubs or gallops  Lungs: clear to auscultation  bilaterally, without wheezing, rhonchi, or rales  Abdomen: soft, non-tender, non-distended; no palpable masses,  normoactive bowel sounds  Extremities: no edema       Meds:     Medications were reviewed:  Current Facility-Administered Medications   Medication Dose Route Frequency    amLODIPine  2.5 mg Oral Daily    lidocaine  1 patch Transdermal Q24H    ondansetron  4 mg Intravenous Once    talc, sterile instillation  4 g Intrapleural Once     Infusion Meds[1]  PRN Medications[2]      Reviewed labs, micro, and imaging.     Signed by: Florene Glen, MD               [1]   Current Facility-Administered Medications   Medication Dose Route Frequency Last Rate   [2]   Current Facility-Administered Medications   Medication Dose Route    acetaminophen  650 mg Oral    benzocaine-menthol  1 lozenge Buccal    benzonatate  100 mg Oral    carboxymethylcellulose sodium  1 drop Both Eyes    dextrose  15 g of glucose Oral    Or    dextrose  12.5 g Intravenous    Or    dextrose  12.5 g Intravenous    Or    glucagon (rDNA)  1 mg Intramuscular    diphenhydrAMINE  25 mg Intravenous    HYDROmorphone  0.4 mg Intravenous    magnesium sulfate  1 g Intravenous    melatonin  3 mg Oral    naloxone  0.2 mg Intravenous    oxyCODONE  5 mg Oral    potassium & sodium phosphates  2 packet Oral    potassium chloride  0-60 mEq Oral    Or    potassium chloride  0-60 mEq Oral    Or    potassium chloride  10 mEq Intravenous    saline  2 spray Each Nare

## 2023-09-03 NOTE — Plan of Care (Signed)
Problem: Inadequate Gas Exchange  Goal: Adequate oxygenation and improved ventilation  Outcome: Progressing  Flowsheets (Taken 09/03/2023 0348 by Raymond Gurney, RN)  Adequate oxygenation and improved ventilation:   Monitor SpO2 and treat as needed   Monitor and treat ETCO2   Provide mechanical and oxygen support to facilitate gas exchange   Position for maximum ventilatory efficiency   Plan activities to conserve energy: plan rest periods   Assess lung sounds   Consult/collaborate with Respiratory Therapy   Increase activity as tolerated/progressive mobility   Teach/reinforce use of incentive spirometer 10 times per hour while awake, cough and deep breath as needed  Goal: Patent Airway maintained  Outcome: Progressing  Flowsheets (Taken 09/03/2023 0348 by Raymond Gurney, RN)  Patent airway maintained:   Position patient for maximum ventilatory efficiency   Provide adequate fluid intake to liquefy secretions   Reposition patient every 2 hours and as needed unless able to self-reposition   Reinforce use of ordered respiratory interventions (i.e. CPAP, BiPAP, Incentive Spirometer, Acapella, etc.)     Problem: Pain interferes with ability to perform ADL  Goal: Pain at adequate level as identified by patient  Outcome: Progressing  Flowsheets (Taken 09/03/2023 0348 by Raymond Gurney, RN)  Pain at adequate level as identified by patient:   Identify patient comfort function goal   Assess pain on admission, during daily assessment and/or before any "as needed" intervention(s)   Reassess pain within 30-60 minutes of any procedure/intervention, per Pain Assessment, Intervention, Reassessment (AIR) Cycle   Evaluate if patient comfort function goal is met   Evaluate patient's satisfaction with pain management progress   Consult/collaborate with Physical Therapy, Occupational Therapy, and/or Speech Therapy   Consult/collaborate with Pain Service   Offer non-pharmacological pain management interventions    Include patient/patient care companion in decisions related to pain management as needed     Problem: Side Effects from Pain Analgesia  Goal: Patient will experience minimal side effects of analgesic therapy  Outcome: Progressing  Flowsheets (Taken 09/03/2023 0348 by Raymond Gurney, RN)  Patient will experience minimal side effects of analgesic therapy:   Monitor/assess patient's respiratory status (RR depth, effort, breath sounds)   Assess for changes in cognitive function   Prevent/manage side effects per LIP orders (i.e. nausea, vomiting, pruritus, constipation, urinary retention, etc.)     Problem: Safety  Goal: Patient will be free from injury during hospitalization  Outcome: Progressing  Flowsheets (Taken 09/03/2023 0348 by Raymond Gurney, RN)  Patient will be free from injury during hospitalization:   Assess patient's risk for falls and implement fall prevention plan of care per policy   Provide and maintain safe environment   Use appropriate transfer methods   Ensure appropriate safety devices are available at the bedside   Include patient/ family/ care giver in decisions related to safety   Assess for patients risk for elopement and implement Elopement Risk Plan per policy   Hourly rounding  Goal: Patient will be free from infection during hospitalization  Outcome: Progressing  Flowsheets (Taken 09/03/2023 0348 by Raymond Gurney, RN)  Free from Infection during hospitalization:   Assess and monitor for signs and symptoms of infection   Monitor lab/diagnostic results   Encourage patient and family to use good hand hygiene technique   Monitor all insertion sites (i.e. indwelling lines, tubes, urinary catheters, and drains)     Problem: Moderate/High Fall Risk Score >5  Goal: Patient will remain free of falls  Outcome: Progressing  Flowsheets (  Taken 09/03/2023 0900)  High (Greater than 13):   HIGH-Visual cue at entrance to patient's room   HIGH-Bed alarm on at all times while patient in bed    HIGH-Utilize chair pad alarm for patient while in the chair   HIGH-Apply yellow "Fall Risk" arm band   HIGH-Initiate use of floor mats as appropriate   HIGH-Consider use of low bed

## 2023-09-03 NOTE — Nursing Progress Note (Signed)
Vitals: BP 149/81, Pulse oximeter 97% on 1 liter of NC, HR 80, and temperature 97.4 F  Neuro: A&Ox4, HOB 45  Respiratory: Patient respiration returning to normal level. Left right breath sounds is diminished. Pt denies SOB. CT on his right side on -20 mmHg on continuous suction.  Cardiac: On continuous telemetry, bradycardia with HR in 50's  GI/GU: Regular diet, eating what he can (little bites if needed + Ensure. History of anorexia.  Skin: His skin is appropriate for ethnicity, warm and dry with the presence of scars. Lidocaine patch on the skin  (patch removed).     Events: Walked 5 laps. CT output this shift 1 mL with CDI dressing. Serosanguinous fluid. Vomited once while ambulating (Zofran given IV as needed)      Plan:   Chest X-ray  Ambulation   Monitor Labs  CT management  Encourage eating even small bites + Ensure

## 2023-09-04 ENCOUNTER — Inpatient Hospital Stay: Payer: Medicare Other

## 2023-09-04 LAB — LAB USE ONLY - CBC WITH DIFFERENTIAL
Absolute Basophils: 0.06 10*3/uL (ref 0.00–0.08)
Absolute Eosinophils: 0.31 10*3/uL (ref 0.00–0.44)
Absolute Immature Granulocytes: 0.02 10*3/uL (ref 0.00–0.07)
Absolute Lymphocytes: 0.86 10*3/uL (ref 0.42–3.22)
Absolute Monocytes: 0.58 10*3/uL (ref 0.21–0.85)
Absolute Neutrophils: 5.28 10*3/uL (ref 1.10–6.33)
Absolute nRBC: 0 10*3/uL (ref <=0.00)
Basophils %: 0.8 %
Eosinophils %: 4.4 %
Hematocrit: 39 % (ref 37.6–49.6)
Hemoglobin: 12.3 g/dL — ABNORMAL LOW (ref 12.5–17.1)
Immature Granulocytes %: 0.3 %
Lymphocytes %: 12.1 %
MCH: 28.1 pg (ref 25.1–33.5)
MCHC: 31.5 g/dL (ref 31.5–35.8)
MCV: 89 fL (ref 78.0–96.0)
MPV: 9.7 fL (ref 8.9–12.5)
Monocytes %: 8.2 %
Neutrophils %: 74.2 %
Platelet Count: 250 10*3/uL (ref 142–346)
Preliminary Absolute Neutrophil Count: 5.28 10*3/uL (ref 1.10–6.33)
RBC: 4.38 10*6/uL (ref 4.20–5.90)
RDW: 13 % (ref 11–15)
WBC: 7.11 10*3/uL (ref 3.10–9.50)
nRBC %: 0 /100{WBCs} (ref <=0.0)

## 2023-09-04 LAB — BASIC METABOLIC PANEL
Anion Gap: 9 (ref 5.0–15.0)
BUN: 11 mg/dL (ref 9–28)
CO2: 27 meq/L (ref 17–29)
Calcium: 8.8 mg/dL (ref 7.9–10.2)
Chloride: 103 meq/L (ref 99–111)
Creatinine: 0.8 mg/dL (ref 0.5–1.5)
GFR: >60 mL/min/{1.73_m2} (ref >=60.0)
Glucose: 100 mg/dL (ref 70–100)
Potassium: 4 meq/L (ref 3.5–5.3)
Sodium: 139 meq/L (ref 135–145)

## 2023-09-04 LAB — PHOSPHORUS: Phosphorus: 2.4 mg/dL (ref 2.3–4.7)

## 2023-09-04 LAB — MAGNESIUM: Magnesium: 1.8 mg/dL (ref 1.6–2.6)

## 2023-09-04 LAB — LAB USE ONLY - URINE GRAY CULTURE HOLD TUBE

## 2023-09-04 MED ORDER — HYDROMORPHONE HCL 1 MG/ML IJ SOLN
0.5000 mg | INTRAMUSCULAR | Status: AC | PRN
Start: 2023-09-04 — End: 2023-09-05

## 2023-09-04 MED ORDER — LIDOCAINE HCL 1 % IJ SOLN
10.0000 mL | Freq: Once | INTRAMUSCULAR | Status: DC
Start: 2023-09-04 — End: 2023-09-08
  Filled 2023-09-04: qty 10

## 2023-09-04 MED ORDER — SODIUM CHLORIDE 0.9 % IV SOLN
Freq: Once | INTRAVENOUS | Status: DC
Start: 2023-09-04 — End: 2023-09-04
  Filled 2023-09-04: qty 10

## 2023-09-04 MED ORDER — STERILE WATER FOR INJECTION IJ/IV SOLN (WRAP)
1.0000 g | INTRAMUSCULAR | Status: DC
Start: 2023-09-04 — End: 2023-09-06
  Administered 2023-09-04 – 2023-09-05 (×2): 1 g via INTRAVENOUS
  Filled 2023-09-04 (×3): qty 1000

## 2023-09-04 NOTE — Progress Notes (Signed)
MEDICINE PROGRESS NOTE    Date Time: 09/04/23 1:57 PM  Patient Name: [name] [name]  Attending Physician: Florene Glen, MD    Assessment / Plan:   [name] [name] is a 72 year old male with past medical history of BPH s/p TURP, COPD, anorexia, aortic aneurysm, cachexia, hypertension, right-sided pneumothorax 2023 referred from PCP office to ED with shortness of breath, worsening fatigue and refusal to eat.  Found to have left-sided pneumothorax and is being admitted for further management. CT chest with moderate left-sided pneumothorax approximately 25% of the volume, severe bullous pulmonary emphysema, Staghorn right calculus and infrarenal abdominal aortic aneurysm.     # Severe bullous pulmonary emphysema  # History of COPD  # Acute moderate left-sided pneumothorax s/p chest tube: satting well on room air  -Thoracic surgery consulted by ED: now s/p chest tube placement   - talc pleurodesis 10/6. 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  #Staghorn calculus: some urinary frequency.  - Start CTX 1g IV q24h     # Hypertension  - 24hr BP range: (115-179) / (76-114) (09/01/23)  -Continue home: amlodipine 2.5 mg po    Case discussed with: patient, RN    Additional Diagnoses:   Malnutrition Documentation    Severe Malnutrition--related to inadequate protein energy intake in the setting of acute illness  as evidenced by  intake < 50% of estimated energy requirements for > 5 days, >5% weight loss x 1 month, severe muscle depletion (temporalis, pectoralis major, deltoids, interosseous, trapezius, supraspinatus, infraspinatus, quadriceps, gastrocnemius),  and severe depletion of subcutaneous fat loss (orbital fat pads, buccal fat pads, upper arm/triceps, thoracic/lumbar region).         Safety Checklist:     DVT prophylaxis:  CHEST guideline (See page e199S) Chemical and / or mechanical ppx NOT indicated or contraindicated: Post-op     Foley:  [place] Rn Foley protocol Not present   IVs:  Peripheral IV   PT/OT: Not needed   Daily CBC & or Chem ordered:  SHM/ABIM guidelines (see #5) Will d/c as no longer needed   Reference for approximate charges of common labs: CBC auto diff - $76  BMP - $99  Mg - $79    Lines:     Patient Lines/Drains/Airways Status       Active PICC Line / CVC Line / PIV Line / Drain / Airway / Intraosseous Line / Epidural Line / ART Line / Line / Wound / Pressure Ulcer / NG/OG Tube       Name Placement date Placement time Site Days    Peripheral IV 09/01/23 Anterior;Distal;Left Forearm 09/01/23  0000  Forearm  2    Chest Tube Left Pleural 12 Fr. 09/02/23  1345  Pleural  1    Wound 09/27/22 Surgical Incision Penis Other (Comment) n/a 09/27/22  1022  Penis  341    Wound 09/27/22 Surgical Incision Scrotum Other (Comment) n/a 09/27/22  1022  Scrotum  341                     Disposition: (Please see PAF column for Expected D/C Date)  Today's date: 09/04/2023  Admit Date: 09/01/2023 11:47 PM  LOS: 2  Clinical Milestones: pending CT  Anticipated discharge needs: TBD      Subjective     CC: Pneumothorax, left    HPI/Subjective: denies symptoms.Possibly urinary frequency again. Denies CP, F/C.     Review of Systems:     As per HPI    Physical Exam:     VITAL SIGNS PHYSICAL EXAM   Temp:  [97.3 F (36.3 C)-98.4 F (36.9 C)] 97.3 F (36.3 C)  Heart Rate:  [73-101] 101  Resp Rate:  [16-18] 18  BP: (120-156)/(73-87) 133/83        Intake/Output Summary (Last 24 hours) at 09/04/2023 1357  Last data filed at 09/04/2023 0400  Gross per 24 hour   Intake 100 ml   Output 279 ml   Net -179 ml    Physical Exam  General: awake, alert X 3, thin  Cardiovascular: regular  rate and rhythm, no murmurs, rubs or gallops  Lungs: clear to auscultation bilaterally, without wheezing, rhonchi, or rales, CT in place, c/d/i  Abdomen: soft, non-tender, non-distended; no palpable masses,  normoactive bowel sounds  Extremities: no edema       Meds:     Medications were reviewed:  Current Facility-Administered Medications   Medication Dose Route Frequency    amLODIPine  2.5 mg Oral Daily    lidocaine  1 patch Transdermal Q24H    lidocaine  10 mL Other Once    ondansetron  4 mg Intravenous Once    talc, sterile instillation  4 g Intrapleural Once     Infusion Meds[1]  PRN Medications[2]      Reviewed labs, micro, and imaging.     Signed by: Florene Glen, MD               [1]   Current Facility-Administered Medications   Medication Dose Route Frequency Last Rate   [2]   Current Facility-Administered Medications   Medication Dose Route    acetaminophen  650 mg Oral    benzocaine-menthol  1 lozenge Buccal    benzonatate  100 mg Oral    carboxymethylcellulose sodium  1 drop Both Eyes    dextrose  15 g of glucose Oral    Or    dextrose  12.5 g Intravenous    Or    dextrose  12.5 g Intravenous    Or    glucagon (rDNA)  1 mg Intramuscular    diphenhydrAMINE  25 mg Intravenous    HYDROmorphone  0.4 mg Intravenous    HYDROmorphone  0.5 mg Intravenous    magnesium sulfate  1 g Intravenous    melatonin  3 mg Oral    naloxone  0.2 mg Intravenous    oxyCODONE  5 mg Oral    potassium & sodium phosphates  2 packet Oral    potassium chloride  0-60 mEq Oral    Or    potassium chloride  0-60 mEq Oral    Or    potassium chloride  10 mEq Intravenous    saline  2 spray Each Nare

## 2023-09-04 NOTE — Procedures (Signed)
Talc pleurodesis via chest tube.     Medication: 10 mL of lidocaine 1% followed by 4g talc in 50 cc of NS  Instilled via: L 12 Fr Chest tube  Present for instillation: Patient's son, Bedside RN    Patient tolerance: Well  Consent obtained from patient prior to procedure.     Unclamp time: 3:02PM, Please place chest tube to continuous suction at -20 cm H2O for 48 hrs after unclamping  Advised and showed how to unclamp and directions for timing. Patient and bedside RN aware.      Patient asked to move from side to side every 15/20 minutes over the next two hours. PRN dilaudid ordered if needed for pain. Plan to unclamp before 2 hrs if patient with respiratory distress.    Physical Exam:   GEN: Well developed, well nourished, no acute distress.  HEENT: Anicteric conjunctiva  NECK: Supple, full range of motion.   PULM: Clear to auscultation bilaterally, good excursion. No rales, rhonchi, wheezes.   CV: RRR      This is a 72 yo M w/ emphysema and recurrent PTX.   Will perform bedside talc pleurodesis.       I performed the substantive portion of this visit by personally conducting the physical exam, review of pertinent images/history and formulating management plans in  its entirety. Patient was also seen by resident for the H&P and physical exam portion of   the visit. I reviewed and updated the documented findings and plan of care accordingly.      Tera Mater

## 2023-09-04 NOTE — Nursing Progress Note (Signed)
Neuro: A&Ox4, HOB 45  Respiratory: Patient respiration returning to normal level. Left right breath sounds is diminished. Pt denies SOB. CT to water seal. Oxygen at 1L on NC  Cardiac: On continuous telemetry, NSR  GI/GU: Regular diet, eating what he can (little bites if needed + Ensure. History of anorexia.  Skin: His skin is appropriate for ethnicity, warm and dry with the presence of scars.    IV Gauge 20 peripheral on the LA  Events:   Walked 6 laps.   CT output this shift 2 mL with CDI dressing. Serosanguinous fluid.   For pain(5/10) Dilaudid 0.4 mg (IV) given at 0201    Plan:   Daily Chest X-ray  Ambulation   Monitor Lab results  CT management  Encourage eating even small bites + Ensure

## 2023-09-04 NOTE — Plan of Care (Signed)
NURSING PROGRESS NOTE    Patient Name: [name] [name],[name] [name]   Code Status: NO CPR W/SUP ([facility] docs)  Admit Date: 09/01/2023 9:42 PM  Date Time: 09/04/23 9:42 PM   Hospital Day #: 2    Date of Admission:   09/01/2023    Reason for Admission:   Pneumothorax, left [J93.9]  Pneumothorax [J93.9]      Nursing Note:     Vitals: Occasionally hypertensive & tachycardic, otherwise VSS.   Neuro: A&O x4  Respiratory: On continuous pulse ox. Chest tube to upper L chest. Diminished breath sounds to the L lung. Denies SOB. 1L O2 via nasal cannula.  Cardiac: On tele - Sinus tachycardia. Radial and pedal pulses +2 bilaterally. Denies chest pain.  GI: Continent; last BM 10/04. Flatus (+). Regular diet. Ensure BID. Pt reports poor appetite.  GU: Continent and voids freely.   Skin: Scattered bruising + scars. Blanchable redness to back + sacrum + BL heels. Flakiness to BLE. Surgical incision to L upper chest.     Musculoskeletal: Full ROM of all extremities with weakness to BLE.   Pain: No pain reported throughout shift.  Mobility: x1 assist + walker/IV pole  LDA/Drips: PIV - L forearm; saline locked. Chest tube to L pleural (12 fr.).     Fall Score/Safety: High Fall Risk - Safety Precautions Implemented    Shift Events: Notified MD of asymptomatic tachycardia; no new orders.     Plan: Fall + aspiration precautions. Ensure BID + Regular diet. Petroleum occlusive dressing change Q48 hr. Chest tube to low continuous suction (-20 cmH2O) AT ALL TIMES. Pain management. On tele + continuous pulse ox. Encourage IS use + activity + ambulation as tolerated.     Wound 09/27/22 Surgical Incision Penis Other (Comment) n/a (Active)   Date First Assessed/Time First Assessed: 09/27/22 1022   Wound Type: Surgical Incision  Location: Penis  Wound Location Orientation: Other (Comment)  Wound Description (Comments): n/a      No assessment data to display       No associated orders.       Wound 09/27/22 Surgical Incision Scrotum Other (Comment) n/a (Active)   Date First  Assessed/Time First Assessed: 09/27/22 1022   Wound Type: Surgical Incision  Location: Scrotum  Wound Location Orientation: Other (Comment)  Wound Description (Comments): n/a      No assessment data to display       No associated orders.     Patient Lines/Drains/Airways Status       Active Lines, Drains and Airways       Name Placement date Placement time Site Days    Peripheral IV 09/01/23 Anterior;Distal;Left Forearm 09/01/23  0000  Forearm  3    Chest Tube Left Pleural 12 Fr. 09/02/23  1345  Pleural  2                   Problem: Moderate/High Fall Risk Score >5  Goal: Patient will remain free of falls  Outcome: Progressing  Flowsheets (Taken 09/04/2023 1933)  High (Greater than 13):   HIGH-Visual cue at entrance to patient's room   HIGH-Bed alarm on at all times while patient in bed   HIGH-Utilize chair pad alarm for patient while in the chair   HIGH-Apply yellow "Fall Risk" arm band   HIGH-Initiate use of floor mats as appropriate   HIGH-Consider use of low bed     Problem: Safety  Goal: Patient will be free from injury during hospitalization  Outcome: Progressing  Flowsheets (Taken  09/04/2023 2140)  Patient will be free from injury during hospitalization:   Assess patient's risk for falls and implement fall prevention plan of care per policy   Provide and maintain safe environment   Use appropriate transfer methods   Ensure appropriate safety devices are available at the bedside   Include patient/ family/ care giver in decisions related to safety   Hourly rounding   Assess for patients risk for elopement and implement Elopement Risk Plan per policy   Provide alternative method of communication if needed (communication boards, writing)  Goal: Patient will be free from infection during hospitalization  Outcome: Progressing  Flowsheets (Taken 09/04/2023 2140)  Free from Infection during hospitalization:   Assess and monitor for signs and symptoms of infection   Monitor lab/diagnostic results   Monitor all insertion  sites (i.e. indwelling lines, tubes, urinary catheters, and drains)   Encourage patient and family to use good hand hygiene technique     Problem: Inadequate Gas Exchange  Goal: Adequate oxygenation and improved ventilation  Outcome: Progressing  Flowsheets (Taken 09/04/2023 2140)  Adequate oxygenation and improved ventilation:   Assess lung sounds   Monitor SpO2 and treat as needed   Provide mechanical and oxygen support to facilitate gas exchange   Position for maximum ventilatory efficiency   Teach/reinforce use of incentive spirometer 10 times per hour while awake, cough and deep breath as needed   Plan activities to conserve energy: plan rest periods   Increase activity as tolerated/progressive mobility  Goal: Patent Airway maintained  Outcome: Progressing  Flowsheets (Taken 09/04/2023 2140)  Patent airway maintained:   Position patient for maximum ventilatory efficiency   Provide adequate fluid intake to liquefy secretions   Suction secretions as needed   Reinforce use of ordered respiratory interventions (i.e. CPAP, BiPAP, Incentive Spirometer, Acapella, etc.)   Reposition patient every 2 hours and as needed unless able to self-reposition     Problem: Pain interferes with ability to perform ADL  Goal: Pain at adequate level as identified by patient  Outcome: Progressing  Flowsheets (Taken 09/04/2023 2140)  Pain at adequate level as identified by patient:   Identify patient comfort function goal   Assess for risk of opioid induced respiratory depression, including snoring/sleep apnea. Alert healthcare team of risk factors identified.   Assess pain on admission, during daily assessment and/or before any "as needed" intervention(s)   Reassess pain within 30-60 minutes of any procedure/intervention, per Pain Assessment, Intervention, Reassessment (AIR) Cycle   Evaluate if patient comfort function goal is met   Evaluate patient's satisfaction with pain management progress   Offer non-pharmacological pain management  interventions   Include patient/patient care companion in decisions related to pain management as needed     Problem: Side Effects from Pain Analgesia  Goal: Patient will experience minimal side effects of analgesic therapy  Outcome: Progressing  Flowsheets (Taken 09/04/2023 2140)  Patient will experience minimal side effects of analgesic therapy:   Monitor/assess patient's respiratory status (RR depth, effort, breath sounds)   Assess for changes in cognitive function   Prevent/manage side effects per LIP orders (i.e. nausea, vomiting, pruritus, constipation, urinary retention, etc.)   Evaluate for opioid-induced sedation with appropriate assessment tool (i.e. POSS)     Problem: Compromised Sensory Perception  Goal: Sensory Perception Interventions  Outcome: Progressing  Flowsheets (Taken 09/04/2023 2140)  Sensory Perception Interventions: Offload heels, Pad bony prominences, Reposition q 2hrs/turn Clock, Q2 hour skin assessment under devices if present     Problem: Compromised Moisture  Goal: Moisture level Interventions  Outcome: Progressing  Flowsheets (Taken 09/04/2023 2140)  Moisture level Interventions: Moisture wicking products, Moisture barrier cream     Problem: Compromised Activity/Mobility  Goal: Activity/Mobility Interventions  Outcome: Progressing  Flowsheets (Taken 09/04/2023 2140)  Activity/Mobility Interventions: Pad bony prominences, TAP Seated positioning system when OOB, Promote PMP, Reposition q 2 hrs / turn clock, Offload heels     Problem: Compromised Nutrition  Goal: Nutrition Interventions  Outcome: Progressing  Flowsheets (Taken 09/04/2023 2140)  Nutrition Interventions: Discuss nutrition at Rounds, I&Os, Document % meal eaten, Daily weights     Problem: Compromised Friction/Shear  Goal: Friction and Shear Interventions  Outcome: Progressing  Flowsheets (Taken 09/04/2023 2140)  Friction and Shear Interventions: Pad bony prominences, Off load heels, HOB 30 degrees or less unless contraindicated,  Consider: TAP seated positioning, Heel foams

## 2023-09-04 NOTE — Plan of Care (Signed)
NURSING PROGRESS NOTE    Patient Name: [name] [name],[name] [name]   Code Status: NO CPR W/SUP  Admit Date: 09/01/2023 11:39 AM  Date Time: 09/04/23 11:39 AM   Hospital Day #: 2    Date of Admission:   09/01/2023    Reason for Admission:   Pneumothorax, left [J93.9]  Pneumothorax [J93.9]      Nursing Note:     Vitals: VSS  Neuro: A/Ox4  Respiratory: Breathing unlabored, chest rise symmetrical, on RA  Cardiac: Denies chest pain, no tele, S1, S2 auscultated  GI: Regular diet, denies N/V  GU: Voiding freely  Skin: scattered scars and bruising, blanchable redness to heels and back and sacrum    Wound 09/27/22 Surgical Incision Penis Other (Comment) n/a (Active)   Date First Assessed/Time First Assessed: 09/27/22 1022   Wound Type: Surgical Incision  Location: Penis  Wound Location Orientation: Other (Comment)  Wound Description (Comments): n/a      No assessment data to display       No associated orders.       Wound 09/27/22 Surgical Incision Scrotum Other (Comment) n/a (Active)   Date First Assessed/Time First Assessed: 09/27/22 1022   Wound Type: Surgical Incision  Location: Scrotum  Wound Location Orientation: Other (Comment)  Wound Description (Comments): n/a      No assessment data to display       No associated orders.     Musculoskeletal: WDL  Pain: Pt denies pain at this time, will reassess PRN  Mobility: SB assist  LDA/Drips:   Patient Lines/Drains/Airways Status       Active Lines, Drains and Airways       Name Placement date Placement time Site Days    Peripheral IV 09/01/23 Anterior;Distal;Left Forearm 09/01/23  0000  Forearm  3    Chest Tube Left Pleural 12 Fr. 09/02/23  1345  Pleural  1                    Fall Score/Safety: High fall risk, safety precautions in place    Shift Events: Talc instilled in CT by MD    Plan: Ambulate, IS, daily CXR, pain management        Problem: Moderate/High Fall Risk Score >5  Goal: Patient will remain free of falls  Outcome: Progressing  Flowsheets (Taken 09/04/2023 0957)  High (Greater than  13):   HIGH-Visual cue at entrance to patient's room   HIGH-Bed alarm on at all times while patient in bed   HIGH-Utilize chair pad alarm for patient while in the chair   HIGH-Initiate use of floor mats as appropriate   HIGH-Consider use of low bed     Problem: Safety  Goal: Patient will be free from injury during hospitalization  Outcome: Progressing  Flowsheets (Taken 09/03/2023 0348 by Raymond Gurney, RN)  Patient will be free from injury during hospitalization:   Assess patient's risk for falls and implement fall prevention plan of care per policy   Provide and maintain safe environment   Use appropriate transfer methods   Ensure appropriate safety devices are available at the bedside   Include patient/ family/ care giver in decisions related to safety   Assess for patients risk for elopement and implement Elopement Risk Plan per policy   Hourly rounding  Goal: Patient will be free from infection during hospitalization  Outcome: Progressing  Flowsheets (Taken 09/03/2023 0348 by Raymond Gurney, RN)  Free from Infection during hospitalization:   Assess and monitor for signs  and symptoms of infection   Monitor lab/diagnostic results   Encourage patient and family to use good hand hygiene technique   Monitor all insertion sites (i.e. indwelling lines, tubes, urinary catheters, and drains)     Problem: Inadequate Gas Exchange  Goal: Adequate oxygenation and improved ventilation  Outcome: Progressing  Flowsheets (Taken 09/03/2023 0348 by Raymond Gurney, RN)  Adequate oxygenation and improved ventilation:   Monitor SpO2 and treat as needed   Monitor and treat ETCO2   Provide mechanical and oxygen support to facilitate gas exchange   Position for maximum ventilatory efficiency   Plan activities to conserve energy: plan rest periods   Assess lung sounds   Consult/collaborate with Respiratory Therapy   Increase activity as tolerated/progressive mobility   Teach/reinforce use of incentive spirometer 10  times per hour while awake, cough and deep breath as needed  Goal: Patent Airway maintained  Outcome: Progressing  Flowsheets (Taken 09/03/2023 0348 by Raymond Gurney, RN)  Patent airway maintained:   Position patient for maximum ventilatory efficiency   Provide adequate fluid intake to liquefy secretions   Reposition patient every 2 hours and as needed unless able to self-reposition   Reinforce use of ordered respiratory interventions (i.e. CPAP, BiPAP, Incentive Spirometer, Acapella, etc.)     Problem: Pain interferes with ability to perform ADL  Goal: Pain at adequate level as identified by patient  Outcome: Progressing  Flowsheets (Taken 09/03/2023 0348 by Raymond Gurney, RN)  Pain at adequate level as identified by patient:   Identify patient comfort function goal   Assess pain on admission, during daily assessment and/or before any "as needed" intervention(s)   Reassess pain within 30-60 minutes of any procedure/intervention, per Pain Assessment, Intervention, Reassessment (AIR) Cycle   Evaluate if patient comfort function goal is met   Evaluate patient's satisfaction with pain management progress   Consult/collaborate with Physical Therapy, Occupational Therapy, and/or Speech Therapy   Consult/collaborate with Pain Service   Offer non-pharmacological pain management interventions   Include patient/patient care companion in decisions related to pain management as needed     Problem: Side Effects from Pain Analgesia  Goal: Patient will experience minimal side effects of analgesic therapy  Outcome: Progressing  Flowsheets (Taken 09/03/2023 0348 by Raymond Gurney, RN)  Patient will experience minimal side effects of analgesic therapy:   Monitor/assess patient's respiratory status (RR depth, effort, breath sounds)   Assess for changes in cognitive function   Prevent/manage side effects per LIP orders (i.e. nausea, vomiting, pruritus, constipation, urinary retention, etc.)

## 2023-09-04 NOTE — Progress Notes (Signed)
Thoracic Surgery Daily Progress Note  Team Spectra: [phone]        Assessment:   [name] [name] is a 72 y.o. male w/ recurrent secondary PTX now s/p CT guided chest tube placement.      Plan:   - Plan for bedside talc pleurodesis today    - Daily CXR   - Multimodal pain control  - Continue Ambulation, at least 10 laps in the hallway per shift  - Incentive Spirometry Use 10X/hr while awake    Patient seen and D/W Dr. Elliot [name]    Interval History:   24 hour Events: CT water seal, PTX stable    Subjective: Feels fine today. Denies CP/SOB.     Physical Exam:   Current Vitals:   BP 152/83   Pulse 85   Temp 97.7 F (36.5 C) (Oral)   Resp 17   Ht 1.6 m (5\' 3" )   Wt 36.7 kg (81 lb)   SpO2 97%   BMI 14.35 kg/m       GEN: NAD  HEENT: Anicteric conjunctiva, pupils reactive  NECK: Supple, good range of motion.    PULM: good respiratory effort, on 1L O2.   CV: RRR, no m/r/g appreciated   ABD: Soft, non-tender, non-distended  EXT: Warm, no edema  NEURO: alert, oriented, able to answer questions appropriately  PSYCH: Appropriate mood and affect  SKIN: No diffuse rashes        Lines/Drains:   Chest Tube: 9mL of serosanguinous drainage in past 24 hours. -Airleak.     Laboratory and Radiological Results:   Morning CXR reviewed.    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

## 2023-09-05 ENCOUNTER — Inpatient Hospital Stay: Payer: Medicare Other

## 2023-09-05 LAB — CULTURE, URINE: Culture Urine: >100000

## 2023-09-05 MED ORDER — METOPROLOL SUCCINATE ER 25 MG PO TB24
25.0000 mg | ORAL_TABLET | Freq: Once | ORAL | Status: DC
Start: 2023-09-05 — End: 2023-09-08

## 2023-09-05 NOTE — Plan of Care (Signed)
NURSING PROGRESS NOTE    Patient Name: [name] [name],[name] [name]   Code Status: NO CPR w/SUP ([facility] docs)  Admit Date: 09/01/2023 9:59 PM  Date Time: 09/05/23 9:59 PM   Hospital Day #: 3    Date of Admission:   09/01/2023    Reason for Admission:   Pneumothorax, left [J93.9]  Pneumothorax [J93.9]      Nursing Note:     Vitals: Occasionally tachycardic (asymptomatic), otherwise VSS.  Neuro: A&O x4  Respiratory: On continuous pulse ox. Chest tube to upper L chest; to low continuous suction AT ALL TIMES. Shallow respirations with dyspnea upon exertion. Diminished breath sounds on the L lung. 2L O2 via nasal cannula.   Cardiac: On tele - Sinus tachycardia. Radial and pedal pulses +2 bilaterally. Denies chest pain.   GI: Continent; last BM 10/04. Flatus (+). Regular diet. Ensure BID. Pt reports loss of appetite.   GU: Continent and voids freely.  Skin: Scattered bruising + scars. Blanchable redness to sacrum. Flakiness to BLE. Chest tube insertion site to L upper chest.     Musculoskeletal: Full ROM of all extremities with weakness to BLE.   Pain: No pain reported throughout shift.   Mobility: x1 assist + walker/IV pole  LDA/Drips: PIV - L forearm; saline locked. Chest tube to L pleural (12 fr.).    Fall Score/Safety: High Fall Risk Score - Safety Precautions Implemented    Shift Events:     Plan: Pain management. On tele + continuous pulse ox. Chest tube to continuous suction AT ALL TIMES (-20 cmH2O); ends @1500  on 10/08. Possible palliative consult. Aspiration + fall precautions. Daily CXR. Regular diet + Ensure BID. Change petroleum occlusive dressing Q48 hrs. Encourage IS use + ambulation + activity as tolerated.     Wound 09/27/22 Surgical Incision Penis Other (Comment) n/a (Active)   Date First Assessed/Time First Assessed: 09/27/22 1022   Wound Type: Surgical Incision  Location: Penis  Wound Location Orientation: Other (Comment)  Wound Description (Comments): n/a      No assessment data to display       No associated orders.        Wound 09/27/22 Surgical Incision Scrotum Other (Comment) n/a (Active)   Date First Assessed/Time First Assessed: 09/27/22 1022   Wound Type: Surgical Incision  Location: Scrotum  Wound Location Orientation: Other (Comment)  Wound Description (Comments): n/a      No assessment data to display       No associated orders.     Patient Lines/Drains/Airways Status       Active Lines, Drains and Airways       Name Placement date Placement time Site Days    Peripheral IV 09/01/23 Anterior;Distal;Left Forearm 09/01/23  0000  Forearm  4    Chest Tube Left Pleural 12 Fr. 09/02/23  1345  Pleural  3                   Problem: Moderate/High Fall Risk Score >5  Goal: Patient will remain free of falls  Outcome: Progressing  Flowsheets (Taken 09/05/2023 1923)  High (Greater than 13):   HIGH-Visual cue at entrance to patient's room   HIGH-Bed alarm on at all times while patient in bed   HIGH-Utilize chair pad alarm for patient while in the chair   HIGH-Apply yellow "Fall Risk" arm band   HIGH-Initiate use of floor mats as appropriate   HIGH-Consider use of low bed     Problem: Safety  Goal: Patient will be free  from injury during hospitalization  Outcome: Progressing  Flowsheets (Taken 09/05/2023 2158)  Patient will be free from injury during hospitalization:   Assess patient's risk for falls and implement fall prevention plan of care per policy   Provide and maintain safe environment   Use appropriate transfer methods   Ensure appropriate safety devices are available at the bedside   Include patient/ family/ care giver in decisions related to safety   Hourly rounding   Assess for patients risk for elopement and implement Elopement Risk Plan per policy   Provide alternative method of communication if needed (communication boards, writing)  Goal: Patient will be free from infection during hospitalization  Outcome: Progressing  Flowsheets (Taken 09/04/2023 2140)  Free from Infection during hospitalization:   Assess and monitor for signs  and symptoms of infection   Monitor lab/diagnostic results   Monitor all insertion sites (i.e. indwelling lines, tubes, urinary catheters, and drains)   Encourage patient and family to use good hand hygiene technique     Problem: Inadequate Gas Exchange  Goal: Adequate oxygenation and improved ventilation  Outcome: Progressing  Flowsheets (Taken 09/05/2023 2158)  Adequate oxygenation and improved ventilation:   Assess lung sounds   Monitor SpO2 and treat as needed   Provide mechanical and oxygen support to facilitate gas exchange   Position for maximum ventilatory efficiency   Teach/reinforce use of incentive spirometer 10 times per hour while awake, cough and deep breath as needed   Plan activities to conserve energy: plan rest periods   Increase activity as tolerated/progressive mobility  Goal: Patent Airway maintained  Outcome: Progressing  Flowsheets (Taken 09/05/2023 2158)  Patent airway maintained:   Position patient for maximum ventilatory efficiency   Provide adequate fluid intake to liquefy secretions   Suction secretions as needed   Reinforce use of ordered respiratory interventions (i.e. CPAP, BiPAP, Incentive Spirometer, Acapella, etc.)   Reposition patient every 2 hours and as needed unless able to self-reposition     Problem: Pain interferes with ability to perform ADL  Goal: Pain at adequate level as identified by patient  Outcome: Progressing  Flowsheets (Taken 09/05/2023 2158)  Pain at adequate level as identified by patient:   Identify patient comfort function goal   Assess for risk of opioid induced respiratory depression, including snoring/sleep apnea. Alert healthcare team of risk factors identified.   Assess pain on admission, during daily assessment and/or before any "as needed" intervention(s)   Reassess pain within 30-60 minutes of any procedure/intervention, per Pain Assessment, Intervention, Reassessment (AIR) Cycle   Evaluate if patient comfort function goal is met   Offer  non-pharmacological pain management interventions   Include patient/patient care companion in decisions related to pain management as needed     Problem: Side Effects from Pain Analgesia  Goal: Patient will experience minimal side effects of analgesic therapy  Outcome: Progressing  Flowsheets (Taken 09/05/2023 2158)  Patient will experience minimal side effects of analgesic therapy:   Monitor/assess patient's respiratory status (RR depth, effort, breath sounds)   Assess for changes in cognitive function   Prevent/manage side effects per LIP orders (i.e. nausea, vomiting, pruritus, constipation, urinary retention, etc.)   Evaluate for opioid-induced sedation with appropriate assessment tool (i.e. POSS)     Problem: Compromised Sensory Perception  Goal: Sensory Perception Interventions  Outcome: Progressing  Flowsheets (Taken 09/05/2023 2158)  Sensory Perception Interventions: Offload heels, Pad bony prominences, Reposition q 2hrs/turn Clock, Q2 hour skin assessment under devices if present     Problem: Compromised  Moisture  Goal: Moisture level Interventions  Outcome: Progressing  Flowsheets (Taken 09/05/2023 2158)  Moisture level Interventions: Moisture wicking products, Moisture barrier cream     Problem: Compromised Activity/Mobility  Goal: Activity/Mobility Interventions  Outcome: Progressing  Flowsheets (Taken 09/05/2023 2158)  Activity/Mobility Interventions: Pad bony prominences, TAP Seated positioning system when OOB, Promote PMP, Reposition q 2 hrs / turn clock, Offload heels     Problem: Compromised Nutrition  Goal: Nutrition Interventions  Outcome: Progressing  Flowsheets (Taken 09/05/2023 2158)  Nutrition Interventions: Discuss nutrition at Rounds, I&Os, Document % meal eaten, Daily weights     Problem: Compromised Friction/Shear  Goal: Friction and Shear Interventions  Outcome: Progressing  Flowsheets (Taken 09/05/2023 2158)  Friction and Shear Interventions: Pad bony prominences, Off load heels, HOB 30  degrees or less unless contraindicated, Consider: TAP seated positioning, Heel foams

## 2023-09-05 NOTE — Progress Notes (Signed)
Thoracic Surgery Daily Progress Note  Team Spectra: [phone]        Assessment:   [name] [name] is a 72 y.o. male w/ recurrent secondary PTX now s/p CT guided chest tube placement.   10/7: Bedside talc pleurodesis      Plan:   - CT to suction x 48 hours (ends 10/8 at 1500)  - Daily CXR   - Multimodal pain control  - Continue Ambulation, at least 10 laps in the hallway per shift  - Incentive Spirometry Use 10X/hr while awake    Patient seen and D/W Dr. Elliot Dally    Interval History:   24 hour Events: Bedside talc pleurodesis.     Subjective: Feels fine today. Denies CP/SOB.     Physical Exam:   Current Vitals:   BP 111/78   Pulse (!) 109   Temp 97.8 F (36.6 C) (Oral)   Resp 17   Ht 1.6 m (5\' 3" )   Wt 36.7 kg (81 lb)   SpO2 99%   BMI 14.35 kg/m       GEN: NAD  HEENT: Anicteric conjunctiva, pupils reactive  NECK: Supple, good range of motion.    PULM: good respiratory effort, on 1L O2.   CV: RRR, no m/r/g appreciated   ABD: Soft, non-tender, non-distended  EXT: Warm, no edema  NEURO: alert, oriented, able to answer questions appropriately  PSYCH: Appropriate mood and affect  SKIN: No diffuse rashes        Lines/Drains:   Chest Tube: 11mL of serosanguinous drainage in past 24 hours. -Airleak.     Laboratory and Radiological Results:   Morning CXR reviewed.    Lab Results   Component Value Date    WBC 7.11 09/04/2023    HGB 12.3 (L) 09/04/2023    HCT 39.0 09/04/2023    MCV 89.0 09/04/2023    PLT 250 09/04/2023     Lab Results   Component Value Date    NA 139 09/04/2023    K 4.0 09/04/2023    CL 103 09/04/2023    CO2 27 09/04/2023    BUN 11 09/04/2023    CREAT 0.8 09/04/2023    MG 1.8 09/04/2023         XR Chest AP Portable    Result Date: 09/04/2023  No significant change left pneumothorax likely underestimated on radiograph due to severe bullous emphysema [name], MD 09/04/2023 8:09 AM

## 2023-09-05 NOTE — Plan of Care (Signed)
Neuro:AOX3, vietnamese speaking  CV:on tele, sinus tachy  Resp:NC 2L,>94%, left chest tube to  LCWS  GI: on regular diet ,poor appetite  BO:FBPZWCH freely in the urinal  Integ: Left upper chest tube covered with dressing c/d/i  Musc: ambulates x1 assist  Pain:no pain reported  Plan:  IS  Ambulate  Monitor chest tube      Problem: Moderate/High Fall Risk Score >5  Goal: Patient will remain free of falls  Flowsheets (Taken 09/05/2023 0800)  High (Greater than 13):   HIGH-Visual cue at entrance to patient's room   HIGH-Consider use of low bed   HIGH-Initiate use of floor mats as appropriate   HIGH-Pharmacy to initiate evaluation and intervention per protocol   HIGH-Apply yellow "Fall Risk" arm band   HIGH-Utilize chair pad alarm for patient while in the chair   HIGH-Bed alarm on at all times while patient in bed     Problem: Inadequate Gas Exchange  Goal: Adequate oxygenation and improved ventilation  Flowsheets (Taken 09/05/2023 1236)  Adequate oxygenation and improved ventilation:   Assess lung sounds   Monitor SpO2 and treat as needed   Provide mechanical and oxygen support to facilitate gas exchange   Teach/reinforce use of incentive spirometer 10 times per hour while awake, cough and deep breath as needed  Goal: Patent Airway maintained  Flowsheets (Taken 09/05/2023 1236)  Patent airway maintained:   Provide adequate fluid intake to liquefy secretions   Reposition patient every 2 hours and as needed unless able to self-reposition     Problem: Pain interferes with ability to perform ADL  Goal: Pain at adequate level as identified by patient  Flowsheets (Taken 09/05/2023 1236)  Pain at adequate level as identified by patient:   Identify patient comfort function goal   Assess pain on admission, during daily assessment and/or before any "as needed" intervention(s)   Reassess pain within 30-60 minutes of any procedure/intervention, per Pain Assessment, Intervention, Reassessment (AIR) Cycle     Problem: Compromised Sensory  Perception  Goal: Sensory Perception Interventions  Flowsheets (Taken 09/05/2023 1236)  Sensory Perception Interventions: Offload heels, Pad bony prominences, Reposition q 2hrs/turn Clock, Q2 hour skin assessment under devices if present     Problem: Compromised Nutrition  Goal: Nutrition Interventions  Flowsheets (Taken 09/05/2023 1236)  Nutrition Interventions: Discuss nutrition at Rounds, I&Os, Document % meal eaten, Daily weights     Problem: Compromised Friction/Shear  Goal: Friction and Shear Interventions  Flowsheets (Taken 09/05/2023 1236)  Friction and Shear Interventions: Pad bony prominences, Off load heels, HOB 30 degrees or less unless contraindicated, Consider: TAP seated positioning, Heel foams

## 2023-09-05 NOTE — Progress Notes (Signed)
MEDICINE PROGRESS NOTE    Date Time: 09/05/23 6:54 PM  Patient Name: [name] [name]  Attending Physician: Florene Glen, MD    Assessment / Plan:   [name] [name] is a 72 year old male with past medical history of BPH s/p TURP, COPD, anorexia, aortic aneurysm, cachexia, hypertension, right-sided pneumothorax 2023 referred from PCP office to ED with shortness of breath, worsening fatigue and refusal to eat.  Found to have left-sided pneumothorax and is being admitted for further management. CT chest with moderate left-sided pneumothorax approximately 25% of the volume, severe bullous pulmonary emphysema, Staghorn right calculus and infrarenal abdominal aortic aneurysm.     # Severe bullous pulmonary emphysema  # History of COPD  # Acute moderate left-sided pneumothorax s/p chest tube: satting well on room air  -Thoracic surgery consulted by ED: now s/p chest tube placement   - talc pleurodesis 10/6. 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  #Staghorn calculus: some urinary frequency.  - Start CTX 1g IV q24h     # Hypertension  -Continue home: amlodipine 2.5 mg po    Case discussed with: patient, RN, thoracic    Additional Diagnoses:   Malnutrition Documentation    Severe Malnutrition--related to inadequate protein energy intake in the setting of acute illness  as evidenced by  intake < 50% of estimated energy requirements for > 5 days, >5% weight loss x 1 month, severe muscle depletion (temporalis, pectoralis major, deltoids, interosseous, trapezius, supraspinatus, infraspinatus, quadriceps, gastrocnemius), and severe depletion  of subcutaneous fat loss (orbital fat pads, buccal fat pads, upper arm/triceps, thoracic/lumbar region).         Safety Checklist:     DVT prophylaxis:  CHEST guideline (See page [id number]) Chemical and / or mechanical ppx NOT indicated or contraindicated: Post-op     Foley:  [place] Rn Foley protocol Not present   IVs:  Peripheral IV   PT/OT: Not needed   Daily CBC & or Chem ordered:  SHM/ABIM guidelines (see #5) Will d/c as no longer needed   Reference for approximate charges of common labs: CBC auto diff - $76  BMP - $99  Mg - $79    Lines:     Patient Lines/Drains/Airways Status       Active PICC Line / CVC Line / PIV Line / Drain / Airway / Intraosseous Line / Epidural Line / ART Line / Line / Wound / Pressure Ulcer / NG/OG Tube       Name Placement date Placement time Site Days    Peripheral IV 09/01/23 Anterior;Distal;Left Forearm 09/01/23  0000  Forearm  2    Chest Tube Left Pleural 12 Fr. 09/02/23  1345  Pleural  1    Wound 09/27/22 Surgical Incision Penis Other (Comment) n/a 09/27/22  1022  Penis  341    Wound 09/27/22 Surgical Incision Scrotum Other (Comment) n/a 09/27/22  1022  Scrotum  341                     Disposition: (Please see PAF column for Expected D/C Date)   Today's date:  09/05/2023  Admit Date: 09/01/2023 11:47 PM  LOS: 3  Clinical Milestones: pending CT  Anticipated discharge needs: TBD      Subjective     CC: Pneumothorax, left    HPI/Subjective: Feels about the same today. Denies pain, SOB.    Review of Systems:     As per HPI    Physical Exam:     VITAL SIGNS PHYSICAL EXAM   Temp:  [97.4 F (36.3 C)-99 F (37.2 C)] 97.7 F (36.5 C)  Heart Rate:  [99-125] 99  Resp Rate:  [17-23] 19  BP: (111-159)/(67-96) 111/67        Intake/Output Summary (Last 24 hours) at 09/05/2023 1854  Last data filed at 09/05/2023 1800  Gross per 24 hour   Intake 837 ml   Output 452 ml   Net 385 ml    Physical Exam  General: awake, alert X 3, thin  Cardiovascular: regular rate and rhythm, no murmurs, rubs or  gallops  Lungs: clear to auscultation bilaterally, without wheezing, rhonchi, or rales, CT in place, c/d/i  Abdomen: soft, non-tender, non-distended; no palpable masses,  normoactive bowel sounds  Extremities: no edema       Meds:     Medications were reviewed:  Current Facility-Administered Medications   Medication Dose Route Frequency    amLODIPine  2.5 mg Oral Daily    cefTRIAXone  1 g Intravenous Q24H    lidocaine  1 patch Transdermal Q24H    lidocaine  10 mL Other Once    metoprolol succinate XL  25 mg Oral Once    ondansetron  4 mg Intravenous Once     Infusion Meds[1]  PRN Medications[2]      Reviewed labs, micro, and imaging.     Signed by: Florene Glen, MD               [1]   Current Facility-Administered Medications   Medication Dose Route Frequency Last Rate   [2]   Current Facility-Administered Medications   Medication Dose Route    acetaminophen  650 mg Oral    benzocaine-menthol  1 lozenge Buccal    benzonatate  100 mg Oral    carboxymethylcellulose sodium  1 drop Both Eyes    dextrose  15 g of glucose Oral    Or    dextrose  12.5 g Intravenous    Or    dextrose  12.5 g Intravenous    Or    glucagon (rDNA)  1 mg Intramuscular    diphenhydrAMINE  25 mg Intravenous    HYDROmorphone  0.4 mg Intravenous    magnesium sulfate  1 g Intravenous    melatonin  3 mg Oral    naloxone  0.2 mg Intravenous    oxyCODONE  5 mg Oral    potassium & sodium phosphates  2 packet Oral    potassium chloride  0-60 mEq Oral    Or    potassium chloride  0-60 mEq Oral    Or    potassium chloride  10 mEq Intravenous    saline  2 spray Each Nare

## 2023-09-05 NOTE — Progress Notes (Signed)
Quick Doc  [facility] - [facility] CCW GROUND UNIT   Patient Name: [facility]   Attending Physician: Florene Glen, MD   Today's date:   09/05/2023 LOS: 3 days   Expected Discharge Date      Quick  Assessment:                                                              ReAdmit Risk Score: 10.32    CM Comments: (P) 10/7 pt adm for Pnumothorax S.P plueredesis on 10/6  Cont with CT to suction .Dipso penindg medical course and CT removal Chart reviewed CM will cont to follow    Simmie Davies RN   Case Manager   [facility]   [phone]                                                                                         Provider Notifications:

## 2023-09-06 ENCOUNTER — Encounter (INDEPENDENT_AMBULATORY_CARE_PROVIDER_SITE_OTHER): Payer: Self-pay

## 2023-09-06 ENCOUNTER — Inpatient Hospital Stay: Payer: Medicare Other

## 2023-09-06 MED ORDER — SENNOSIDES-DOCUSATE SODIUM 8.6-50 MG PO TABS
2.0000 | ORAL_TABLET | Freq: Every evening | ORAL | Status: DC
Start: 2023-09-06 — End: 2023-09-08
  Administered 2023-09-06 – 2023-09-07 (×2): 2 via ORAL
  Filled 2023-09-06 (×2): qty 2

## 2023-09-06 MED ORDER — POLYETHYLENE GLYCOL 3350 17 G PO PACK
17.0000 g | PACK | Freq: Every day | ORAL | Status: DC | PRN
Start: 2023-09-06 — End: 2023-09-08
  Administered 2023-09-07: 17 g via ORAL
  Filled 2023-09-06: qty 1

## 2023-09-06 MED ORDER — LACTULOSE 10 GM/15ML PO SOLN
10.0000 g | Freq: Every day | ORAL | Status: DC | PRN
Start: 2023-09-06 — End: 2023-09-08

## 2023-09-06 NOTE — Nursing Progress Note (Signed)
Neuro: A/Ox4, wear glasses  CV: Cont tele, NSR  Pulm: Cont pulse ox, 2L O2 NC, pigtail chest tube L anterior chest; CT output 0mL during shift, CT to water seal 1500 during shift   GI: regular diet tolerate well, Last BM: 10/4, passing gas  GU: void bedside urinals  Skin: scatter scar and bruises, surgical incision CT insertion L anterior chest  MUSK: 5 laps walk during shift, SBAx1 with 2L O2 NC when ambulate  IV: L forearm   Pain: pt declines pain during shift  Shift Event:   -CT dressing changed during shift       Plan:   -monitor pt for pain and Tx accordingly  -encourage pt ambulate OOB and use IS  -monitor CT output

## 2023-09-06 NOTE — Progress Notes (Signed)
Thoracic Surgery Daily Progress Note  Team Spectra: [phone]        Assessment:   [name] [name] is a 72 y.o. male w/ recurrent secondary PTX now s/p CT guided chest tube placement.   10/7: Bedside talc pleurodesis      Plan:   - CT to suction x 48 hours, plan to place to WS this afternoon at 3 PM  - Pending morning CXR, will plan for clamp trial prior to chest tube removal tomorrow  - Daily CXR   - Multimodal pain control  - Continue Ambulation, at least 10 laps in the hallway per shift  - Incentive Spirometry Use 10X/hr while awake    Patient seen and D/W Dr. Charm Barges, PA-C  Interventional Pulmonology/Thoracic Surgery Physician Assistant  Thoracic Surgery team spectra [phone]    Interval History:   24 hour Events: no acute events, ambulated in the halls    Subjective: W/o complaint    Physical Exam:   Current Vitals:   BP 109/74   Pulse 98   Temp 97.5 F (36.4 C) (Oral)   Resp 16   Ht 1.6 m (5\' 3" )   Wt 36.7 kg (81 lb)   SpO2 99%   BMI 14.35 kg/m       GEN: NAD  HEENT: Anicteric conjunctiva, pupils reactive  NECK: Supple, good range of motion.    PULM: good respiratory effort, on 2L O2.   CV: RRR, no m/r/g appreciated   ABD: Soft, non-tender, non-distended  EXT: Warm, no edema  NEURO: alert, oriented, able to answer questions appropriately  PSYCH: Appropriate mood and affect  SKIN: No diffuse rashes        Lines/Drains:   Chest Tube: minimal serosanguinous drainage in past 24 hours. -Airleak. On suction     Laboratory and Radiological Results:   Morning CXR reviewed.    Lab Results   Component Value Date    WBC 7.11 09/04/2023    HGB 12.3 (L) 09/04/2023    HCT 39.0 09/04/2023    MCV 89.0 09/04/2023    PLT 250 09/04/2023     Lab Results   Component Value Date    NA 139 09/04/2023    K 4.0 09/04/2023    CL 103 09/04/2023    CO2 27 09/04/2023    BUN 11 09/04/2023    CREAT 0.8 09/04/2023    MG 1.8 09/04/2023         XR Chest AP Portable    Result Date: 09/06/2023  1. Small left pneumothorax, not  significantly changed with pleural catheter in place. 2. Stable small left pleural effusion. Kennyth Lose, MD 09/06/2023 9:40 AM

## 2023-09-06 NOTE — Plan of Care (Signed)
Problem: Inadequate Gas Exchange  Goal: Adequate oxygenation and improved ventilation  Outcome: Progressing  Flowsheets (Taken 09/05/2023 2158 by Joseph Art, RN)  Adequate oxygenation and improved ventilation:   Assess lung sounds   Monitor SpO2 and treat as needed   Provide mechanical and oxygen support to facilitate gas exchange   Position for maximum ventilatory efficiency   Teach/reinforce use of incentive spirometer 10 times per hour while awake, cough and deep breath as needed   Plan activities to conserve energy: plan rest periods   Increase activity as tolerated/progressive mobility  Goal: Patent Airway maintained  Outcome: Progressing  Flowsheets (Taken 09/05/2023 2158 by Joseph Art, RN)  Patent airway maintained:   Position patient for maximum ventilatory efficiency   Provide adequate fluid intake to liquefy secretions   Suction secretions as needed   Reinforce use of ordered respiratory interventions (i.e. CPAP, BiPAP, Incentive Spirometer, Acapella, etc.)   Reposition patient every 2 hours and as needed unless able to self-reposition     Problem: Safety  Goal: Patient will be free from injury during hospitalization  Outcome: Progressing  Flowsheets (Taken 09/05/2023 2158 by Joseph Art, RN)  Patient will be free from injury during hospitalization:   Assess patient's risk for falls and implement fall prevention plan of care per policy   Provide and maintain safe environment   Use appropriate transfer methods   Ensure appropriate safety devices are available at the bedside   Include patient/ family/ care giver in decisions related to safety   Hourly rounding   Assess for patients risk for elopement and implement Elopement Risk Plan per policy   Provide alternative method of communication if needed (communication boards, writing)  Goal: Patient will be free from infection during hospitalization  Outcome: Progressing  Flowsheets (Taken 09/04/2023 2140 by Joseph Art, RN)  Free from  Infection during hospitalization:   Assess and monitor for signs and symptoms of infection   Monitor lab/diagnostic results   Monitor all insertion sites (i.e. indwelling lines, tubes, urinary catheters, and drains)   Encourage patient and family to use good hand hygiene technique     Problem: Moderate/High Fall Risk Score >5  Goal: Patient will remain free of falls  Outcome: Progressing  Flowsheets (Taken 09/05/2023 1923 by Joseph Art, RN)  High (Greater than 13):   HIGH-Visual cue at entrance to patient's room   HIGH-Bed alarm on at all times while patient in bed   HIGH-Utilize chair pad alarm for patient while in the chair   HIGH-Apply yellow "Fall Risk" arm band   HIGH-Initiate use of floor mats as appropriate   HIGH-Consider use of low bed     Problem: Pain interferes with ability to perform ADL  Goal: Pain at adequate level as identified by patient  Outcome: Progressing  Flowsheets (Taken 09/05/2023 2158 by Joseph Art, RN)  Pain at adequate level as identified by patient:   Identify patient comfort function goal   Assess for risk of opioid induced respiratory depression, including snoring/sleep apnea. Alert healthcare team of risk factors identified.   Assess pain on admission, during daily assessment and/or before any "as needed" intervention(s)   Reassess pain within 30-60 minutes of any procedure/intervention, per Pain Assessment, Intervention, Reassessment (AIR) Cycle   Evaluate if patient comfort function goal is met   Offer non-pharmacological pain management interventions   Include patient/patient care companion in decisions related to pain management as needed     Problem: Side Effects from Pain  Analgesia  Goal: Patient will experience minimal side effects of analgesic therapy  Outcome: Progressing  Flowsheets (Taken 09/05/2023 2158 by Joseph Art, RN)  Patient will experience minimal side effects of analgesic therapy:   Monitor/assess patient's respiratory status (RR depth, effort,  breath sounds)   Assess for changes in cognitive function   Prevent/manage side effects per LIP orders (i.e. nausea, vomiting, pruritus, constipation, urinary retention, etc.)   Evaluate for opioid-induced sedation with appropriate assessment tool (i.e. POSS)

## 2023-09-06 NOTE — Progress Notes (Signed)
MEDICINE PROGRESS NOTE    Date Time: 09/06/23 3:04 PM  Patient Name: [name] [name],[name] [name]  Attending Physician: Peterson Ao, MD    Assessment / Plan:   Angelo Prindle is a 72 year old male with past medical history of BPH s/p TURP, COPD, anorexia, aortic aneurysm, cachexia, hypertension, right-sided pneumothorax 2023 referred from PCP office to ED with shortness of breath, worsening fatigue and refusal to eat.  Found to have left-sided pneumothorax and is being admitted for further management. CT chest with moderate left-sided pneumothorax approximately 25% of the volume, severe bullous pulmonary emphysema, Staghorn right calculus and infrarenal abdominal aortic aneurysm.     # Severe bullous pulmonary emphysema  # History of COPD  # Acute moderate left-sided pneumothorax s/p chest tube: satting well on room air  -Thoracic surgery consulted by ED: now s/p chest tube placement   - talc pleurodesis 10/6. Unclamp this afternoon with suction at -20 for 48 hours (end 10/8 at 1500).  Clamp trial today. Follow up CXR in am .   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  #Staghorn calculus: some urinary frequency.  - UC from 10/5 is normal flora  - can discontinue ceftriaxone     #Constipation  - start bowel meds     # Hypertension  -Continue home: amlodipine 2.5 mg po    Case discussed with: patient, RN, thoracic    Additional Diagnoses:   Malnutrition Documentation    Severe Malnutrition--related to inadequate protein energy intake in the setting of acute illness  as evidenced by  intake < 50% of estimated energy requirements for > 5 days, >5% weight loss x 1 month, severe muscle depletion (temporalis, pectoralis  major, deltoids, interosseous, trapezius, supraspinatus, infraspinatus, quadriceps, gastrocnemius), and severe depletion of subcutaneous fat loss (orbital fat pads, buccal fat pads, upper arm/triceps, thoracic/lumbar region).         Safety Checklist:     DVT prophylaxis:  CHEST guideline (See page [id number]) Chemical and / or mechanical ppx NOT indicated or contraindicated: Post-op     Foley:  [place] Rn Foley protocol Not present   IVs:  Peripheral IV   PT/OT: Not needed   Daily CBC & or Chem ordered:  SHM/ABIM guidelines (see #5) Will d/c as no longer needed   Reference for approximate charges of common labs: CBC auto diff - $76  BMP - $99  Mg - $79    Lines:     Patient Lines/Drains/Airways Status       Active PICC Line / CVC Line / PIV Line / Drain / Airway / Intraosseous Line / Epidural Line / ART Line / Line / Wound / Pressure Ulcer / NG/OG Tube       Name Placement date Placement time Site Days    Peripheral IV 09/01/23 Anterior;Distal;Left Forearm 09/01/23  0000  Forearm  2    Chest Tube Left Pleural 12 Fr. 09/02/23  1345  Pleural  1    Wound 09/27/22 Surgical Incision Penis Other (Comment) n/a 09/27/22  1022  Penis  341    Wound 09/27/22 Surgical Incision Scrotum Other (Comment) n/a 09/27/22  1022  Scrotum  341  Disposition: (Please see PAF column for Expected D/C Date)   Today's date: 09/06/2023  Admit Date: 09/01/2023 11:47 PM  LOS: 4  Clinical Milestones: pending CT  Anticipated discharge needs: TBD      Subjective     CC: Pneumothorax, left    HPI/Subjective: Feels constipated. Breathing stable. Afebrile.     Review of Systems:     As per HPI    Physical Exam:     VITAL SIGNS PHYSICAL EXAM   Temp:  [97.3 F (36.3 C)-98.2 F (36.8 C)] 97.5 F (36.4 C)  Heart Rate:  [94-122] 98  Resp Rate:  [16-18] 16  BP: (109-128)/(74-81) 109/74        Intake/Output Summary (Last 24 hours) at 09/06/2023 1504  Last data filed at 09/06/2023 0930  Gross per 24 hour   Intake 537 ml   Output 577 ml   Net -40  ml    Physical Exam  General: awake, alert X 3, thin  Cardiovascular: regular rate and rhythm, no murmurs, rubs or gallops  Lungs: clear to auscultation bilaterally, without wheezing, rhonchi, or rales, CT in place, c/d/i  Abdomen: soft, non-tender, non-distended; no palpable masses,  normoactive bowel sounds  Extremities: no edema       Meds:     Medications were reviewed:  Current Facility-Administered Medications   Medication Dose Route Frequency    amLODIPine  2.5 mg Oral Daily    cefTRIAXone  1 g Intravenous Q24H    lidocaine  1 patch Transdermal Q24H    lidocaine  10 mL Other Once    metoprolol succinate XL  25 mg Oral Once    ondansetron  4 mg Intravenous Once    senna-docusate  2 tablet Oral QHS     Infusion Meds[1]  PRN Medications[2]      Reviewed labs, micro, and imaging.     Signed by: Peterson Ao, MD               [1]   Current Facility-Administered Medications   Medication Dose Route Frequency Last Rate   [2]   Current Facility-Administered Medications   Medication Dose Route    acetaminophen  650 mg Oral    benzocaine-menthol  1 lozenge Buccal    benzonatate  100 mg Oral    carboxymethylcellulose sodium  1 drop Both Eyes    dextrose  15 g of glucose Oral    Or    dextrose  12.5 g Intravenous    Or    dextrose  12.5 g Intravenous    Or    glucagon (rDNA)  1 mg Intramuscular    diphenhydrAMINE  25 mg Intravenous    HYDROmorphone  0.4 mg Intravenous    lactulose  10 g Oral    magnesium sulfate  1 g Intravenous    melatonin  3 mg Oral    naloxone  0.2 mg Intravenous    oxyCODONE  5 mg Oral    polyethylene glycol  17 g Oral    potassium & sodium phosphates  2 packet Oral    potassium chloride  0-60 mEq Oral    Or    potassium chloride  0-60 mEq Oral    Or    potassium chloride  10 mEq Intravenous    saline  2 spray Each Nare

## 2023-09-07 ENCOUNTER — Inpatient Hospital Stay: Payer: Medicare Other

## 2023-09-07 MED ORDER — BISACODYL 10 MG RE SUPP
10.0000 mg | Freq: Once | RECTAL | Status: DC
Start: 2023-09-07 — End: 2023-09-08

## 2023-09-07 NOTE — Plan of Care (Signed)
Surgical Progress Note    GI: Gas  [x]   BM []   NG []  Nausea []   Diet: regular  GU: Foley []  Voiding [x]    Pain: denies pain  Central Line/Drain/Vac/Ostomy: Chest tube to water seal  Skin/Incision: chest tube insertion site in L chest covered with gauze and tegaderm, C/D/I.   Mobility: Independent []  Set up assist [x]  Mod assist []  Max assist []    Tele: [x]   Rhythm:  Sinus Tachy Cont Pulse ox: [x]   Shift Events : walked 6 laps this shift; A&O x4; sinus tachy, other vitals stable; fall and safety precautions in place.     Problem: Moderate/High Fall Risk Score >5  Goal: Patient will remain free of falls  Outcome: Progressing  Flowsheets (Taken 09/06/2023 2255)  High (Greater than 13):   HIGH-Visual cue at entrance to patient's room   HIGH-Bed alarm on at all times while patient in bed   HIGH-Initiate use of floor mats as appropriate     Problem: Safety  Goal: Patient will be free from injury during hospitalization  Outcome: Progressing  Flowsheets (Taken 09/07/2023 0248)  Patient will be free from injury during hospitalization:   Assess patient's risk for falls and implement fall prevention plan of care per policy   Provide and maintain safe environment   Use appropriate transfer methods   Include patient/ family/ care giver in decisions related to safety  Goal: Patient will be free from infection during hospitalization  Outcome: Progressing  Flowsheets (Taken 09/07/2023 0248)  Free from Infection during hospitalization:   Assess and monitor for signs and symptoms of infection   Monitor lab/diagnostic results   Monitor all insertion sites (i.e. indwelling lines, tubes, urinary catheters, and drains)   Encourage patient and family to use good hand hygiene technique     Problem: Inadequate Gas Exchange  Goal: Adequate oxygenation and improved ventilation  Outcome: Progressing  Flowsheets (Taken 09/07/2023 0248)  Adequate oxygenation and improved ventilation:   Monitor SpO2 and treat as needed   Teach/reinforce use of  incentive spirometer 10 times per hour while awake, cough and deep breath as needed   Position for maximum ventilatory efficiency   Plan activities to conserve energy: plan rest periods   Increase activity as tolerated/progressive mobility   Assess lung sounds  Goal: Patent Airway maintained  Outcome: Progressing  Flowsheets (Taken 09/07/2023 0248)  Patent airway maintained:   Position patient for maximum ventilatory efficiency   Provide adequate fluid intake to liquefy secretions   Reinforce use of ordered respiratory interventions (i.e. CPAP, BiPAP, Incentive Spirometer, Acapella, etc.)

## 2023-09-07 NOTE — Plan of Care (Signed)
Problem: Inadequate Gas Exchange  Goal: Adequate oxygenation and improved ventilation  Outcome: Progressing  Flowsheets (Taken 09/07/2023 2130)  Adequate oxygenation and improved ventilation:   Assess lung sounds   Position for maximum ventilatory efficiency   Monitor SpO2 and treat as needed   Monitor and treat ETCO2   Provide mechanical and oxygen support to facilitate gas exchange   Teach/reinforce use of incentive spirometer 10 times per hour while awake, cough and deep breath as needed   Plan activities to conserve energy: plan rest periods   Increase activity as tolerated/progressive mobility   Consult/collaborate with Respiratory Therapy  Goal: Patent Airway maintained  Outcome: Progressing  Flowsheets (Taken 09/07/2023 2130)  Patent airway maintained:   Position patient for maximum ventilatory efficiency   Provide adequate fluid intake to liquefy secretions   Reinforce use of ordered respiratory interventions (i.e. CPAP, BiPAP, Incentive Spirometer, Acapella, etc.)   Reposition patient every 2 hours and as needed unless able to self-reposition     Problem: Pain interferes with ability to perform ADL  Goal: Pain at adequate level as identified by patient  Outcome: Progressing  Flowsheets (Taken 09/05/2023 2158 by Joseph Art, RN)  Pain at adequate level as identified by patient:   Identify patient comfort function goal   Assess for risk of opioid induced respiratory depression, including snoring/sleep apnea. Alert healthcare team of risk factors identified.   Assess pain on admission, during daily assessment and/or before any "as needed" intervention(s)   Reassess pain within 30-60 minutes of any procedure/intervention, per Pain Assessment, Intervention, Reassessment (AIR) Cycle   Evaluate if patient comfort function goal is met   Offer non-pharmacological pain management interventions   Include patient/patient care companion in decisions related to pain management as needed     Problem: Side Effects  from Pain Analgesia  Goal: Patient will experience minimal side effects of analgesic therapy  Outcome: Progressing  Flowsheets (Taken 09/07/2023 2130)  Patient will experience minimal side effects of analgesic therapy:   Monitor/assess patient's respiratory status (RR depth, effort, breath sounds)   Assess for changes in cognitive function   Prevent/manage side effects per LIP orders (i.e. nausea, vomiting, pruritus, constipation, urinary retention, etc.)   Evaluate for opioid-induced sedation with appropriate assessment tool (i.e. POSS)     Problem: Safety  Goal: Patient will be free from injury during hospitalization  Outcome: Progressing  Flowsheets (Taken 09/07/2023 2130)  Patient will be free from injury during hospitalization:   Assess patient's risk for falls and implement fall prevention plan of care per policy   Provide and maintain safe environment   Use appropriate transfer methods   Ensure appropriate safety devices are available at the bedside   Include patient/ family/ care giver in decisions related to safety   Assess for patients risk for elopement and implement Elopement Risk Plan per policy   Provide alternative method of communication if needed (communication boards, writing)  Goal: Patient will be free from infection during hospitalization  Outcome: Progressing  Flowsheets (Taken 09/07/2023 2130)  Free from Infection during hospitalization:   Assess and monitor for signs and symptoms of infection   Monitor lab/diagnostic results   Monitor all insertion sites (i.e. indwelling lines, tubes, urinary catheters, and drains)   Encourage patient and family to use good hand hygiene technique     Problem: Moderate/High Fall Risk Score >5  Goal: Patient will remain free of falls  Outcome: Progressing  Flowsheets (Taken 09/07/2023 2130)  High (Greater than 13):   HIGH-Visual  cue at entrance to patient's room   HIGH-Utilize chair pad alarm for patient while in the chair   HIGH-Consider use of low bed   HIGH-Bed  alarm on at all times while patient in bed   HIGH-Apply yellow "Fall Risk" arm band   HIGH-Initiate use of floor mats as appropriate

## 2023-09-07 NOTE — Progress Notes (Signed)
Quick Doc  [facility] GROUND UNIT   Patient Name: [name],[name]   Attending Physician: Peterson Ao, MD   Today's date:   09/07/2023 LOS: 5 days   Expected Discharge Date      Quick  Assessment:                                                              ReAdmit Risk Score: 10.65    CM Comments: (P) 10/09 adm S/P pneumothorax , Malnutrtion and Failure to thrive Hx end stage COPD Cont with chest tube to suction .Dispo pending medical stability and treatment Chart reviewed will cont to follow   Simmie Davies RN   Case Manager   [facility]   [phone]                                                                                         Provider Notifications:

## 2023-09-07 NOTE — Progress Notes (Signed)
Thoracic Surgery Daily Progress Note  Team Spectra: [phone]        Assessment:   [name] [name] is a 72 y.o. male w/ recurrent secondary PTX now s/p CT guided chest tube placement.   10/7: Bedside talc pleurodesis. Chest tube to water seal 10/8, AM chest xray stable        Plan:   - Clamp chest tube this AM.  Repeat chest xray around 1 PM.    - Possible chest tube removal this afternoon pending clamp trial chest xray  - Daily CXR   - Multimodal pain control  - Continue Ambulation, at least 10 laps in the hallway per shift  - Incentive Spirometry Use 10X/hr while awake    Patient seen and D/W Dr. Marzetta Board, PA-C  Interventional Pulmonology/Thoracic Surgery Physician Assistant  Thoracic Surgery team spectra [phone]    Interval History:   24 hour Events: Tolerated chest tube to water seal    Subjective: W/o complaint    Physical Exam:   Current Vitals:   BP 121/80   Pulse 97   Temp 97.8 F (36.6 C) (Oral)   Resp 16   Ht 1.6 m (5\' 3" )   Wt 36.7 kg (81 lb)   SpO2 100%   BMI 14.35 kg/m       GEN: NAD  HEENT: Anicteric conjunctiva, pupils reactive  NECK: Supple, good range of motion.    PULM: good respiratory effort, on 2L O2.   CV: RRR, no m/r/g appreciated   ABD: Soft, non-tender, non-distended  EXT: Warm, no edema  NEURO: alert, oriented, able to answer questions appropriately  PSYCH: Appropriate mood and affect  SKIN: No diffuse rashes        Lines/Drains:   Chest Tube: minimal serosanguinous drainage in past 24 hours. -Airleak.      Laboratory and Radiological Results:   Morning CXR reviewed.    Lab Results   Component Value Date    WBC 7.11 09/04/2023    HGB 12.3 (L) 09/04/2023    HCT 39.0 09/04/2023    MCV 89.0 09/04/2023    PLT 250 09/04/2023     Lab Results   Component Value Date    NA 139 09/04/2023    K 4.0 09/04/2023    CL 103 09/04/2023    CO2 27 09/04/2023    BUN 11 09/04/2023    CREAT 0.8 09/04/2023    MG 1.8 09/04/2023         XR Chest AP Portable    Result Date: 09/07/2023  No significant  change left pneumothorax with trace left effusion, chest place [name], MD 09/07/2023 8:26 AM

## 2023-09-07 NOTE — Nursing Progress Note (Signed)
Neuro: A/Ox4, wear glasses  CV: Cont tele, Sinus Tachy pt denies chest pain  Pulm: Cont pulse ox, pt on RA when sit bed when pt ambulates need to be 1L O2 NC, pt did have CT to water seal clamp trial done during shift, CT removed during shift by provider [id number], IS 750  GI: regular diet tolerate well, Last BM: 10/9 (today), Passing gas  GU: void bedside urinals  Skin: scatter scar and bruises, CT insertion site L anterior chest  MUSK: SBAx1 with walker, walk 7 laps during shift   IV: L forearm  Pain: pt report no pain during shift     Shift Event:   -ambulatory pulse ox test done during shift, notes in pt's chart  -CXR done during shift daily CXR and CXR done after clamp trial    Plan:   -monitor pt for pain  -encourage pt ambulate OOB and use IS  -plan for d/c tomorrow

## 2023-09-07 NOTE — Plan of Care (Signed)
L chest tube removed after clamp trial. Patient tolerated well. Occlusive dressing placed at site and patient instructed to keep site covered for 72 hrs.      Lenis Dickinson, PA-C  Interventional Pulmonology/Thoracic Surgery Physician Assistant  Thoracic Surgery team spectra [phone]

## 2023-09-07 NOTE — Nursing Progress Note (Signed)
PULSE OXIMETRY TESTING:    Document patient's oxygen at rest on room air:   __97___% on room air, at rest (No ranges please)    _____% on oxygen, at rest at_____LPM via NC  IF 88% OR BELOW on room air, STOP HERE,    IF NOT ambulate patient on room air with exertion and document below:    __84___% on room air, with exertion (must be 88% or below)    __94___% on oxygen, with exertion, at___1__LPM via NC    All three tests must be during the same session.     Please document in a PROGRESS NOTE.     Testing to qualify for home oxygen must be no earlier than 48 hours prior to discharge, or it will need to be repeated.     Please call your unit Case Manager when completed.

## 2023-09-07 NOTE — Progress Notes (Signed)
MEDICINE PROGRESS NOTE    Date Time: 09/07/23 3:55 PM  Patient Name: [name] [name],[name] [name]  Attending Physician: Peterson Ao, MD    Assessment / Plan:   Teegan Brandis is a 72 year old male with past medical history of BPH s/p TURP, COPD, anorexia, aortic aneurysm, cachexia, hypertension, right-sided pneumothorax 2023 referred from PCP office to ED with shortness of breath, worsening fatigue and refusal to eat.  Found to have left-sided pneumothorax and is being admitted for further management. CT chest with moderate left-sided pneumothorax approximately 25% of the volume, severe bullous pulmonary emphysema, Staghorn right calculus and infrarenal abdominal aortic aneurysm.     # Severe bullous pulmonary emphysema  # History of COPD  # Acute moderate left-sided pneumothorax s/p chest tube: satting well on room air  -Thoracic surgery consulted by ED: now s/p chest tube placement   - talc pleurodesis 10/6. Unclamp this afternoon with suction at -20 for 48 hours (end 10/8 at 1500).  CT removed 10/9.  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  #Staghorn calculus: some urinary frequency.  - UC from 10/5 is normal flora  - can discontinue ceftriaxone     #Constipation  - start bowel meds     # Hypertension  -Continue home: amlodipine 2.5 mg po    Case discussed with: patient, RN, thoracic    Additional Diagnoses:   Malnutrition Documentation    Severe Malnutrition--related to inadequate protein energy intake in the setting of acute illness  as evidenced by  intake < 50% of estimated energy requirements for > 5 days, >5% weight loss x 1 month, severe  muscle depletion (temporalis, pectoralis major, deltoids, interosseous, trapezius, supraspinatus, infraspinatus, quadriceps, gastrocnemius), and severe depletion of subcutaneous fat loss (orbital fat pads, buccal fat pads, upper arm/triceps, thoracic/lumbar region).         Safety Checklist:     DVT prophylaxis:  CHEST guideline (See page e199S) Chemical and / or mechanical ppx NOT indicated or contraindicated: Post-op     Foley:  [place] Rn Foley protocol Not present   IVs:  Peripheral IV   PT/OT: Not needed   Daily CBC & or Chem ordered:  SHM/ABIM guidelines (see #5) Will d/c as no longer needed   Reference for approximate charges of common labs: CBC auto diff - $76  BMP - $99  Mg - $79    Lines:     Patient Lines/Drains/Airways Status       Active PICC Line / CVC Line / PIV Line / Drain / Airway / Intraosseous Line / Epidural Line / ART Line / Line / Wound / Pressure Ulcer / NG/OG Tube       Name Placement date Placement time Site Days    Peripheral IV 09/01/23 Anterior;Distal;Left Forearm 09/01/23  0000  Forearm  2    Chest Tube Left Pleural 12 Fr. 09/02/23  1345  Pleural  1    Wound 09/27/22 Surgical Incision Penis Other (Comment) n/a 09/27/22  1022  Penis  341    Wound 09/27/22 Surgical Incision Scrotum Other (Comment) n/a 09/27/22  1022  Scrotum  341  Disposition: (Please see PAF column for Expected D/C Date)   Today's date: 09/07/2023  Admit Date: 09/01/2023 11:47 PM  LOS: 5  Clinical Milestones: pending CT  Anticipated discharge needs: TBD      Subjective     CC: Pneumothorax, left    HPI/Subjective: Had BM today.  Breathing is stable. Tolerating diet. Afebrile. No further urinary symptoms.     Review of Systems:     As per HPI    Physical Exam:     VITAL SIGNS PHYSICAL EXAM   Temp:  [97.4 F (36.3 C)-97.9 F (36.6 C)] 97.4 F (36.3 C)  Heart Rate:  [97-109] 108  Resp Rate:  [15-17] 16  BP: (113-147)/(75-88) 113/76        Intake/Output Summary (Last 24 hours) at 09/07/2023 1555  Last  data filed at 09/07/2023 0845  Gross per 24 hour   Intake 200 ml   Output 575 ml   Net -375 ml    Physical Exam  General: awake, alert X 3, thin  Cardiovascular: regular rate and rhythm, no murmurs, rubs or gallops  Lungs: clear to auscultation bilaterally, without wheezing, rhonchi, or rales, CT in place, c/d/i  Abdomen: soft, non-tender, non-distended; no palpable masses,  normoactive bowel sounds  Extremities: no edema       Meds:     Medications were reviewed:  Current Facility-Administered Medications   Medication Dose Route Frequency    amLODIPine  2.5 mg Oral Daily    bisacodyl  10 mg Rectal Once    lidocaine  1 patch Transdermal Q24H    lidocaine  10 mL Other Once    metoprolol succinate XL  25 mg Oral Once    ondansetron  4 mg Intravenous Once    senna-docusate  2 tablet Oral QHS     Infusion Meds[1]  PRN Medications[2]      Reviewed labs, micro, and imaging.     Signed by: Peterson Ao, MD               [1]   Current Facility-Administered Medications   Medication Dose Route Frequency Last Rate   [2]   Current Facility-Administered Medications   Medication Dose Route    acetaminophen  650 mg Oral    benzocaine-menthol  1 lozenge Buccal    benzonatate  100 mg Oral    carboxymethylcellulose sodium  1 drop Both Eyes    dextrose  15 g of glucose Oral    Or    dextrose  12.5 g Intravenous    Or    dextrose  12.5 g Intravenous    Or    glucagon (rDNA)  1 mg Intramuscular    diphenhydrAMINE  25 mg Intravenous    HYDROmorphone  0.4 mg Intravenous    lactulose  10 g Oral    magnesium sulfate  1 g Intravenous    melatonin  3 mg Oral    naloxone  0.2 mg Intravenous    oxyCODONE  5 mg Oral    polyethylene glycol  17 g Oral    potassium & sodium phosphates  2 packet Oral    potassium chloride  0-60 mEq Oral    Or    potassium chloride  0-60 mEq Oral    Or    potassium chloride  10 mEq Intravenous    saline  2 spray Each Nare

## 2023-09-08 ENCOUNTER — Inpatient Hospital Stay: Payer: Medicare Other

## 2023-09-08 LAB — BASIC METABOLIC PANEL
Anion Gap: 12 (ref 5.0–15.0)
BUN: 19 mg/dL (ref 9–28)
CO2: 29 meq/L (ref 17–29)
Calcium: 10.3 mg/dL — ABNORMAL HIGH (ref 7.9–10.2)
Chloride: 99 meq/L (ref 99–111)
Creatinine: 1 mg/dL (ref 0.5–1.5)
GFR: >60 mL/min/{1.73_m2} (ref >=60.0)
Glucose: 107 mg/dL — ABNORMAL HIGH (ref 70–100)
Potassium: 4 meq/L (ref 3.5–5.3)
Sodium: 140 meq/L (ref 135–145)

## 2023-09-08 LAB — MAGNESIUM: Magnesium: 2.3 mg/dL (ref 1.6–2.6)

## 2023-09-08 MED ORDER — METOPROLOL TARTRATE 12.5 MG PO SPLIT TAB
12.5000 mg | ORAL_TABLET | Freq: Two times a day (BID) | ORAL | Status: DC
Start: 2023-09-08 — End: 2023-09-08
  Administered 2023-09-08 (×2): 12.5 mg via ORAL
  Filled 2023-09-08 (×2): qty 1

## 2023-09-08 MED ORDER — BUDESONIDE 0.5 MG/2ML IN SUSP
0.5000 mg | Freq: Every day | RESPIRATORY_TRACT | 0 refills | Status: AC
Start: 2023-09-08 — End: ?

## 2023-09-08 MED ORDER — INNOSPIRE ESSENCE NEBULIZER MISC
1.0000 [IU] | Freq: Once | 0 refills | Status: AC
Start: 2023-09-08 — End: 2023-09-08

## 2023-09-08 MED ORDER — ALBUTEROL-IPRATROPIUM 2.5-0.5 (3) MG/3ML IN SOLN
3.0000 mL | Freq: Four times a day (QID) | RESPIRATORY_TRACT | 0 refills | Status: AC
Start: 2023-09-08 — End: ?

## 2023-09-08 NOTE — Progress Notes (Signed)
09/08/23 1506   CMA Tasks   CMA tasks DME delivered     Case Management Department  CMS received Rx for patient [name] [name] from the Case Management team.       CMS delivered FWW and Portable Tank to room @  3:01 PM     CMS submitted documents to [employer] [facility].

## 2023-09-08 NOTE — Plan of Care (Signed)
Surgical Progress Note    GI: Gas  [x]   BM []   NG []  Nausea []   Diet: regular  GU: Foley []  Voiding [x]    Pain: denies pain  Central Line/Drain/Vac/Ostomy: N/A  Skin/Incision: removed chest tube insertion site in L chest covered with gauze and tegaderm, C/D/I.   Mobility: Independent []  Set up assist [x]  Mod assist []  Max assist []    Tele: [x]   Rhythm: Sinus Tachy Cont Pulse ox: [x]    Shift Events : 8 beats of Vtach, asymptomatic, covering provider notified, metoprolol ordered and given. Walked 2 laps. A&O x4; fall and safety precautions in place.     Problem: Moderate/High Fall Risk Score >5  Goal: Patient will remain free of falls  09/08/2023 0205 by Marlane Mingle, RN  Outcome: Progressing  Flowsheets (Taken 09/08/2023 0147)  High (Greater than 13):   HIGH-Visual cue at entrance to patient's room   HIGH-Initiate use of floor mats as appropriate  09/08/2023 0147 by Marlane Mingle, RN  Outcome: Progressing  Flowsheets (Taken 09/08/2023 0147)  High (Greater than 13):   HIGH-Visual cue at entrance to patient's room   HIGH-Initiate use of floor mats as appropriate     Problem: Safety  Goal: Patient will be free from injury during hospitalization  09/08/2023 0205 by Marlane Mingle, RN  Outcome: Progressing  Flowsheets (Taken 09/08/2023 0147)  Patient will be free from injury during hospitalization:   Assess patient's risk for falls and implement fall prevention plan of care per policy   Provide and maintain safe environment   Use appropriate transfer methods   Include patient/ family/ care giver in decisions related to safety  09/08/2023 0147 by Marlane Mingle, RN  Outcome: Progressing  Flowsheets (Taken 09/08/2023 0147)  Patient will be free from injury during hospitalization:   Assess patient's risk for falls and implement fall prevention plan of care per policy   Provide and maintain safe environment   Use appropriate transfer methods   Include patient/ family/ care giver in decisions related to  safety  Goal: Patient will be free from infection during hospitalization  09/08/2023 0205 by Marlane Mingle, RN  Outcome: Progressing  Flowsheets (Taken 09/08/2023 0147)  Free from Infection during hospitalization:   Assess and monitor for signs and symptoms of infection   Monitor lab/diagnostic results   Monitor all insertion sites (i.e. indwelling lines, tubes, urinary catheters, and drains)   Encourage patient and family to use good hand hygiene technique  09/08/2023 0147 by Marlane Mingle, RN  Outcome: Progressing  Flowsheets (Taken 09/08/2023 0147)  Free from Infection during hospitalization:   Assess and monitor for signs and symptoms of infection   Monitor lab/diagnostic results   Monitor all insertion sites (i.e. indwelling lines, tubes, urinary catheters, and drains)   Encourage patient and family to use good hand hygiene technique     Problem: Inadequate Gas Exchange  Goal: Adequate oxygenation and improved ventilation  09/08/2023 0205 by Marlane Mingle, RN  Outcome: Progressing  Flowsheets (Taken 09/08/2023 0147)  Adequate oxygenation and improved ventilation:   Assess lung sounds   Monitor SpO2 and treat as needed   Position for maximum ventilatory efficiency   Teach/reinforce use of incentive spirometer 10 times per hour while awake, cough and deep breath as needed   Increase activity as tolerated/progressive mobility   Plan activities to conserve energy: plan rest periods   Provide mechanical and oxygen support to facilitate gas exchange  09/08/2023 0147 by Marlane Mingle, RN  Outcome: Progressing  Flowsheets (Taken  09/08/2023 0147)  Adequate oxygenation and improved ventilation:   Assess lung sounds   Monitor SpO2 and treat as needed   Position for maximum ventilatory efficiency   Teach/reinforce use of incentive spirometer 10 times per hour while awake, cough and deep breath as needed   Increase activity as tolerated/progressive mobility   Plan activities to conserve energy: plan rest  periods   Provide mechanical and oxygen support to facilitate gas exchange  Goal: Patent Airway maintained  Outcome: Progressing  Flowsheets (Taken 09/08/2023 0205)  Patent airway maintained:   Position patient for maximum ventilatory efficiency   Provide adequate fluid intake to liquefy secretions   Reinforce use of ordered respiratory interventions (i.e. CPAP, BiPAP, Incentive Spirometer, Acapella, etc.)     Problem: Pain interferes with ability to perform ADL  Goal: Pain at adequate level as identified by patient  Outcome: Progressing  Flowsheets (Taken 09/08/2023 0205)  Pain at adequate level as identified by patient:   Identify patient comfort function goal   Assess for risk of opioid induced respiratory depression, including snoring/sleep apnea. Alert healthcare team of risk factors identified.   Assess pain on admission, during daily assessment and/or before any "as needed" intervention(s)   Reassess pain within 30-60 minutes of any procedure/intervention, per Pain Assessment, Intervention, Reassessment (AIR) Cycle   Evaluate patient's satisfaction with pain management progress   Offer non-pharmacological pain management interventions   Include patient/patient care companion in decisions related to pain management as needed   Evaluate if patient comfort function goal is met

## 2023-09-08 NOTE — Progress Notes (Signed)
Pt's IV was removed with catheter intact. Pt discharged with verbal and written instructions when to call the doctor, and medication schedule. Pt's daughter verbalized understanding of discharge teaching. Pt and daughter Amy expressed no further questions or concerns. Pt was discharged with walker assisted with family with oxygen at 1L/min. Pt and daughter brought all belongings with them.

## 2023-09-08 NOTE — Consults (Signed)
Start [facility] Note  Home Health Referral    Referral from Darl Pikes (Case Manager) for home health care upon discharge.    By [employer], the patient has the right to freely choose a home care provider.    A company of the patients choosing. We have supplied the patient with a listing of providers in your area who asked to be included and participate in Medicare.   [facility] a home care agency that provides adult home care services and participates in Medicare   The preferred provider of your insurance company. Choosing a home care provider other than your insurance company's preferred provider may affect your insurance coverage.      Home Health Discharge Information    Your doctor has ordered Skilled Nursing in-home service(s) for you while you recuperate at home, to assist you in the transition from hospital to home.    The agency that you or your representative chose to provide the service:  Name of Home Health Agency Placement:  ([facility] (formerly known as [facility]]  Phone: [phone]     [employer]:  Name of DME Agency: [facility]]  Equipment Ordered: Oxygen and Dan Humphreys  [phone]      The above services were set up by:  Marko Stai, RN (Home Health Liaison)   Phone: [phone]      IF YOU HAVE NOT HEARD FROM YOUR HOME HEALTH AGENCY WITHIN 24-48 HOURS AFTER DISCHARGE PLEASE CALL YOUR AGENCY TO ARRANGE A TIME FOR YOUR FIRST VISIT. FOR ANY SCHEDULING CONCERNS OR QUESTIONS RELATED TO HOME HEALTH, SUCH AS TIME OR DATE PLEASE CONTACT YOUR HOME HEALTH AGENCY AT THE NUMBER LISTED ABOVE.    Additional comments:        START PATIENT REGISTRATION INFORMATION     Order Information  Order Signing Physician: Peterson Ao, MD    Service Ordered RN ?: Yes  Service Ordered PT ?: No    Service Ordered OT ?: No    Service Ordered ST ?: No    Service Ordered MSW?: No    Service Ordered HHA?: No    Following Physician: Oswaldo Done, MD   Following Physician Phone: [phone]   Overseeing Physician: N/A  (Required for Residents only)   Agreeable to Follow?: N/A  Spoke with: N/A  Date/Time of Call: 09/08/23 12:13 PM      Care Coordination   SOC Call from [facility] Required?: no  Same Day [facility]?: no  Primary Care Physician:Jose Leonardo Lincoln Maxin, MD  Primary Care Physician [phone]  Primary Care Physician Address: [address] [place] [address]  PCP NPI: [id number]  Visit Instructions: N/A  Service Discharge Location Type: Home  Service Facility Name: N/A  Service Floor Facility: N/A  Service Room No: N/A    Demographics  Patient Last Name: Yeagley   Patient First Name: [facility]  Language/Communication Barrier: no  Service Address: [address]  [place] [address]   Service Home Phone: [phone] (home)   Other phone numbers:    Telephone Information:   Mobile [phone]     Emergency Contact: Extended Emergency Contact Information  Primary Emergency Contact: Thuy,Amy  Mobile Phone: [phone]  Relation: Daughter  Preferred language: English  Interpreter needed? No  Secondary Emergency Contact: Carey,Thai  Address: [address]           [place], [place] [address] [place] [employer] Phone: [phone]  Relation: Son  Equities trader needed? No    Admission Information  Admit Date: 09/01/2023  Patient Status at discharge: Inpatient  Admitting Diagnosis: Pneumothorax, left [J93.9]  Pneumothorax [J93.9]     Caregiver Information  Caregiver First Name: Amy  Caregiver Last Name: Thuy  Caregiver Relationship to Patient: Child  Caregiver Phone Number: [phone]  Caregiver Notes: Please leave voicemail if does not answer,   very good at calling back          Data processing manager Information  Primary Subscriber:   Primary Subscriber Relation To Guarantor:   Primary Payor:   Primary Plan:   Primary Group #:    Primary Subscriber ID:    Primary Subscriber DOB:   Secondary Insurance  Information  Secondary Subscriber:   Secondary Subscriber Relation To Guarantor:   Secondary Payor:   Secondary Plan:   Secondary Group #:   Secondary Subscriber ID:   Secondary Subscriber DOB:   HITECH  NO      END PATIENT REGISTRATION INFORMATION       Diagnosis: Pneumothorax, left [J93.9]  Pneumothorax [J93.9]    Start [facility] Summary        Additional Comments:        Home Health face-to-face (FTF) Encounter (Order [id number])  Consult  Date: 09/08/2023 Department: Sophronia Simas CCW Ground Unit Ordering/Authorizing: Peterson Ao, MD     Order Information    Order Date/Time Release Date/Time Start Date/Time End Date/Time   09/08/23 11:03 AM None 09/08/23 10:54 AM 09/08/23 10:54 AM     Order Details    Frequency Duration Priority Order Class   Once 1  occurrence Routine Hospital Performed     Standing Order Information    Remaining Occurrences Interval Last Released     0/1 Once 09/08/2023              Provider Information    Ordering User Ordering Provider Authorizing Provider   Marko Stai, RN Peterson Ao, MD Peterson Ao, MD   Attending Provider(s) Admitting Provider PCP   Devin Going, MD; Jules Schick, MD; Jannett Celestine, MD; Florene Glen, MD; Lucilla Edin, MD; Peterson Ao, MD Gustavo Lah, MD [place], Emogene Morgan, MD     Verbal Order Info    Action Created on Order Mode Entered by Responsible Provider Signed by Signed on   Ordering 09/08/23 1103 Per protocol: cosign required Marko Stai, RN Peterson Ao, MD Peterson Ao, MD 09/08/23 1117           Comments    Provider to follow: Oswaldo Done, MD -- [phone]    Home nursing required for skilled assessment including cardiopulmonary assessment and dietary education for disease management, and medication instruction.    _1_L/min Oxygen by nasal cannula with ambulation or activity 8-10 hours per day with portability of gas for >99 months NPI: [id number]    Front Wheel walker needed >99 months, NPI:  [id number]    Height: 160 cm (5\' 3" ) (09/01/23 2314)  Weight: 36.7 kg (81 lb) (09/01/23 2314)    Pneumothorax, left [J93.9]  Pneumothorax [J93.9]                Home Health face-to-face (FTF) Encounter: Patient Communication     Not Released  Not seen         Order Questions    Question Answer   Date I saw the patient face-to-face: 09/08/2023   Evidence this patient is homebound because:  B.  Profound weakness, poor balance/unsteady gait d/t illness/treatment/procedure    C.  Decreased endurance, strength, ROM, cadence, safety/judgment during mobility    I.  Restricted to home to decrease risk of infection   Medical conditions that necessitate Home Health care: B.  Functional impairment due to recent hospitalization/procedure/treatment    C.  Risk for complication/infection/pain requiring follow up and monitoring    D.  Chronic illness & risk for re-hospitalization due to unstable disease status   Per clinical findings, following services are medically necessary: Skilled Nursing   Clinical findings that support the need for Skilled Nursing. SN will: D. Review medication reconciliation, manage and educate on use and side effects    C. Monitor for signs and symptoms of exacerbation of disease and management    G. Educate on new diagnosis, treatment & management to prevent re-hospitalization    H. Assess cardiopulmonary status and monitor for signs &symptoms of exacerbation    N.  Instruct on oxygen use and safety   Other (please specify) see commens for orders SN/FWW/Oxygen                    Process Instructions    Please select Home Care Services medically necessary.    Based on the above findings, I certify that this patient is confined to the home and needs intermittent skilled nursing care, physical therapry and / or speech therapy or continues to need occupational therapy. The patient is under my care, and I have initiated the establishment of the plan of care. This patient will be followed by a physician who  will periodically review the plan of care.     Collection Information            Consult Order Info    ID Description Priority Start Date Start Time   [id number] Home Health face-to-face (FTF) Encounter Routine 09/08/2023 10:54 AM   Provider Specialty Referred to   ______________________________________ _____________________________________                         Verbal Order Info    Action Created on Order Mode Entered by Responsible Provider Signed by Signed on   Ordering 09/08/23 1103 Per protocol: cosign required Marko Stai, RN Peterson Ao, MD Peterson Ao, MD 09/08/23 1117           Patient Information    Patient Name  [name] [name] Legal Sex  Male DOB  November 02, 1951       Reprint Order Requisition    Home Health face-to-face (FTF) Encounter (Order #[id number]) on 09/08/23       Additional Information    Associated Reports External References   Priority and Order Details [employer]         End [facility] Summary     Discharge Date:  09/08/23    Referral Source  Signed by: Marko Stai, RN  Date Time: 09/08/23 12:13 PM      End [facility] Note

## 2023-09-08 NOTE — Progress Notes (Signed)
09/08/23 1256   Case Management Quick Doc   Date of 2nd IMM Letter 09/08/23   Time of 2nd IMM letter 1256   CMA Tasks   CMA tasks IMM delivered     CMS called and spoke to daughter, Amy - verbal given and copy in MyChart

## 2023-09-08 NOTE — Progress Notes (Signed)
Call to dtr re [facility] plans Per dtr O2 will be delivered between 5-8 pm. Dtr will wait for O2 home delivery and then pick pt up at hospital Bedside nurse aware     Simmie Davies RN   Case Manager   [facility]   [phone]

## 2023-09-08 NOTE — Plan of Care (Addendum)
NURSING PROGRESS NOTE    Patient Name: [name] [name],[name] [name]   Code Status: Full  Admit Date: 09/01/2023 12:28 PM  Date Time: 09/08/23 12:28 PM   Hospital Day #: 6    Date of Admission:   09/01/2023    Reason for Admission:   Pneumothorax, left [J93.9]  Pneumothorax [J93.9]    Procedures:             Nursing Note:     Vitals: stable, tachy-MD aware   Neuro: A/Ox4,   Respiratory: 1LNC, dyspnea with exertion, diminished lung sounds  Cardiac: on tele-Sinus tach,  S1/S2 auscultated  GI: poor appetite, takes Ensure. Pt encouraged to eat.   GU: via urinal   Skin: see flowsheet   Wound 09/27/22 Surgical Incision Penis Other (Comment) n/a (Active)   Date First Assessed/Time First Assessed: 09/27/22 1022   Wound Type: Surgical Incision  Location: Penis  Wound Location Orientation: Other (Comment)  Wound Description (Comments): n/a      No assessment data to display       No associated orders.       Wound 09/27/22 Surgical Incision Scrotum Other (Comment) n/a (Active)   Date First Assessed/Time First Assessed: 09/27/22 1022   Wound Type: Surgical Incision  Location: Scrotum  Wound Location Orientation: Other (Comment)  Wound Description (Comments): n/a      No assessment data to display       No associated orders.     Musculoskeletal: weakness to BLE  Pain: denies pain  Mobility: SBA   LDA/Drips:   Patient Lines/Drains/Airways Status       Active Lines, Drains and Airways       Name Placement date Placement time Site Days    Peripheral IV 09/01/23 Anterior;Distal;Left Forearm 09/01/23  0000  Forearm  7                  Fall Score/Safety: bed locked in lowest position, fall mat in place, bedside table/call bell/personal belongings within reach    Shift Events: pt had 6 beats of Vtach this am, Dr. Guadlupe Spanish notified   Plan:  Monitor oxygen   Pain control  IS  Ambulate   Discharge home awaiting oxygen delivery    Problem: Moderate/High Fall Risk Score >5  Goal: Patient will remain free of falls  Flowsheets (Taken 09/08/2023 0757)  High (Greater  than 13):   HIGH-Consider use of low bed   HIGH-Initiate use of floor mats as appropriate   HIGH-Apply yellow "Fall Risk" arm band   HIGH-Bed alarm on at all times while patient in bed   HIGH-Visual cue at entrance to patient's room     Problem: Safety  Goal: Patient will be free from injury during hospitalization  Flowsheets (Taken 09/08/2023 0147 by Marlane Mingle, RN)  Patient will be free from injury during hospitalization:   Assess patient's risk for falls and implement fall prevention plan of care per policy   Provide and maintain safe environment   Use appropriate transfer methods   Include patient/ family/ care giver in decisions related to safety  Goal: Patient will be free from infection during hospitalization  Flowsheets (Taken 09/08/2023 1226)  Free from Infection during hospitalization:   Assess and monitor for signs and symptoms of infection   Monitor all insertion sites (i.e. indwelling lines, tubes, urinary catheters, and drains)   Monitor lab/diagnostic results     Problem: Inadequate Gas Exchange  Goal: Adequate oxygenation and improved ventilation  Flowsheets (Taken 09/08/2023 1226)  Adequate oxygenation  and improved ventilation:   Assess lung sounds   Monitor SpO2 and treat as needed   Teach/reinforce use of incentive spirometer 10 times per hour while awake, cough and deep breath as needed   Plan activities to conserve energy: plan rest periods   Increase activity as tolerated/progressive mobility  Goal: Patent Airway maintained  Flowsheets (Taken 09/08/2023 1226)  Patent airway maintained:   Reinforce use of ordered respiratory interventions (i.e. CPAP, BiPAP, Incentive Spirometer, Acapella, etc.)   Position patient for maximum ventilatory efficiency     Problem: Pain interferes with ability to perform ADL  Goal: Pain at adequate level as identified by patient  Flowsheets (Taken 09/08/2023 1226)  Pain at adequate level as identified by patient:   Identify patient comfort function goal    Assess for risk of opioid induced respiratory depression, including snoring/sleep apnea. Alert healthcare team of risk factors identified.   Assess pain on admission, during daily assessment and/or before any "as needed" intervention(s)   Reassess pain within 30-60 minutes of any procedure/intervention, per Pain Assessment, Intervention, Reassessment (AIR) Cycle   Evaluate if patient comfort function goal is met     Problem: Compromised Sensory Perception  Goal: Sensory Perception Interventions  Flowsheets (Taken 09/07/2023 0845 by Bernadene Bell, RN)  Sensory Perception Interventions: Offload heels, Pad bony prominences, Reposition q 2hrs/turn Clock, Q2 hour skin assessment under devices if present     Problem: Compromised Activity/Mobility  Goal: Activity/Mobility Interventions  Flowsheets (Taken 09/07/2023 0845 by Bernadene Bell, RN)  Activity/Mobility Interventions: Pad bony prominences, TAP Seated positioning system when OOB, Promote PMP, Reposition q 2 hrs / turn clock, Offload heels     Problem: Compromised Nutrition  Goal: Nutrition Interventions  Flowsheets (Taken 09/07/2023 0845 by Bernadene Bell, RN)  Nutrition Interventions: Discuss nutrition at Rounds, I&Os, Document % meal eaten, Daily weights     Problem: Compromised Friction/Shear  Goal: Friction and Shear Interventions  Flowsheets (Taken 09/08/2023 1226)  Friction and Shear Interventions: Pad bony prominences, Off load heels, HOB 30 degrees or less unless contraindicated, Consider: TAP seated positioning, Heel foams

## 2023-09-08 NOTE — Progress Notes (Signed)
Case management discharge note     Patient is medically clear to be discharged home and patient agreed to be discharged home today.      Dispo:Home with family and HH SN Home O2   Transport  family to transport            09/08/23 1218   Discharge Disposition   Patient preference/choice provided? Yes   Physical Discharge Disposition Home, Home Health   Name of Home Health Agency Placement [facility]   Name of DME Agency [facility]   Mode of Transportation Car   Patient/Family/POA notified of transfer plan Yes   Patient agreeable to discharge plan/expected d/c date? Yes   Family/POA agreeable to discharge plan/expected d/c date? Yes   Bedside nurse notified of transport plan? Yes   Special requirements for patient during transport: Oxygen   Outpatient Services   Home Health Skilled Nursing   Services Home DME   CM Interventions   Follow up appointment scheduled? No   Reason no follow up scheduled? Family to schedule   Referral made for home health RN visit? Yes   Medicare Checklist   Is this a Medicare patient? Yes   Patient received 1st IMM Letter? Yes   3 midnight inpatient qualifying stay (SNF only) Yes   If LOS 3 days or greater, did patient received 2nd IMM Letter? Yes   Date of 2nd IMM Letter 09/08/23     Simmie Davies RN   Case Manager   [facility]   [phone]

## 2023-09-08 NOTE — Progress Notes (Signed)
Thoracic Surgery Daily Progress Note  Team Spectra: [phone]        Assessment:   [name] [name] is a 72 y.o. male w/ recurrent secondary PTX now s/p CT guided chest tube placement.   10/7: Bedside talc pleurodesis. Chest tube to water seal 10/8, AM chest xray stable    Plan:   - Multimodal pain control  - Continue Ambulation, at least 10 laps in the hallway per shift  - Incentive Spirometry Use 10X/hr while awake  - No further intervention needed at this time. Patient to follow up in office in 2 weeks.   - Thoracic surgery to sign off at this time. Please call with questions.    Patient seen and D/W Dr. Elliot Dally    Interval History:   24 hour Events: Chest tube removed, small PTX likely stable. HR 10-120    Subjective: Feels okay, no worsening SOB.     Physical Exam:   Current Vitals:   BP 152/85   Pulse (!) 104   Temp 97.9 F (36.6 C) (Oral)   Resp 18   Ht 1.6 m (5\' 3" )   Wt 36.7 kg (81 lb)   SpO2 99%   BMI 14.35 kg/m       GEN: NAD  HEENT: Anicteric conjunctiva, pupils reactive  NECK: Supple, good range of motion.    PULM: good respiratory effort, on 2L O2.   CV: RRR, no m/r/g appreciated   ABD: Soft, non-tender, non-distended  EXT: Warm, no edema  NEURO: alert, oriented, able to answer questions appropriately  PSYCH: Appropriate mood and affect  SKIN: No diffuse rashes        Lines/Drains:   Chest tube removed      Laboratory and Radiological Results:   Morning CXR reviewed.    Lab Results   Component Value Date    WBC 7.11 09/04/2023    HGB 12.3 (L) 09/04/2023    HCT 39.0 09/04/2023    MCV 89.0 09/04/2023    PLT 250 09/04/2023     Lab Results   Component Value Date    NA 140 09/08/2023    K 4.0 09/08/2023    CL 99 09/08/2023    CO2 29 09/08/2023    BUN 19 09/08/2023    CREAT 1.0 09/08/2023    MG 2.3 09/08/2023         XR Chest AP Portable    Result Date: 09/08/2023  1. Left pleural catheter removed with persistent small left pneumothorax. Kennyth Lose, MD 09/08/2023 9:05 AM    XR Chest AP Portable    Result  Date: 09/07/2023   Left pigtail catheter in place. No change in the suspected small left pneumothorax however evaluation limited on frontal view. Consider lateral view for further assessment. Charlott Rakes, MD 09/07/2023 5:00 PM

## 2023-09-08 NOTE — Plan of Care (Signed)
Cross cover note  -Patient had 8 runs of nSVT    A/P  #nSVT  - Check BMP and MG level  -Started on 12.5 mg metoprolol twice daily  -Keep potassium>4, magnesium>2

## 2023-09-08 NOTE — Discharge Instr - AVS First Page (Addendum)
Reason for your Hospital Admission:  You were admitted to the hospital for a pneumothorax; or a collapsed lung.  This is related to your underlying severe COPD.  You were treated with a chest tube and talc pleurodesis to try to keep the lung adhered to the chest wall. When your lung function is compromised, your body works harder in order to compensate and this can lead to weight loss.   It is important that you see a lung specialist in clinic to optimize your residual lung function and to determine what therapies are available to slow progression of disease and improve symptom control.       Instructions for after your discharge:  Please follow up with the thoracic surgery team to follow up on your pneumothorax  You have a follow up with Dr. [place] on 10/25         Home Health Discharge Information     Your doctor has ordered Skilled Nursing in-home service(s) for you while you recuperate at home, to assist you in the transition from hospital to home.     The agency that you or your representative chose to provide the service:  Name of Home Health Agency Placement:  ([facility] (formerly known as [facility]]  Phone: [phone]      [employer]:  Name of DME Agency: [facility]]  Equipment Ordered: Oxygen and Dan Humphreys  [phone]        The above services were set up by:  Marko Stai, RN (Home Health Liaison)   Phone: [phone]        IF YOU HAVE NOT HEARD FROM YOUR HOME HEALTH AGENCY WITHIN 24-48 HOURS AFTER DISCHARGE PLEASE CALL YOUR AGENCY TO ARRANGE A TIME FOR YOUR FIRST VISIT. FOR ANY SCHEDULING CONCERNS OR QUESTIONS RELATED TO HOME HEALTH, SUCH AS TIME OR DATE PLEASE CONTACT YOUR HOME HEALTH AGENCY AT THE NUMBER LISTED ABOVE.

## 2023-09-08 NOTE — Discharge Summary (Signed)
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  # Generalized weakness secondary to above    Disposition:      Home with family    Pending Results, Recommendations & Instructions to providers after discharge:      Follow up with pulmonology and thoracic surgery doctors     Recent Labs:       Results       Procedure Component Value Units Date/Time    Basic Metabolic Panel [[id number]]  (Abnormal) Collected: 09/08/23 0406    Specimen: Blood, Venous Updated: 09/08/23 0523     Glucose 107 mg/dL      BUN 19 mg/dL      Creatinine 1.0 mg/dL      Calcium 47.8 mg/dL      Sodium 295 mEq/L      Potassium 4.0 mEq/L      Chloride 99 mEq/L      CO2 29 mEq/L      Anion Gap 12.0     GFR >60.0 mL/min/1.73 m2     Magnesium [[id number]]  (Normal) Collected: 09/08/23 0406    Specimen: Blood, Venous Updated: 09/08/23 0523     Magnesium 2.3 mg/dL     Culture, Urine [[id number]] Collected: 09/03/23 2149    Specimen: Urine, Clean Catch Updated: 09/05/23 1114     Culture Urine >100,000 CFU/mL Normal urogenital or skin microbiota            Procedures/Radiology performed:   Radiology: all results from this admission  XR Chest AP Portable    Result Date: 09/08/2023  1. Left pleural catheter removed with persistent small left pneumothorax. Kennyth Lose, MD 09/08/2023 9:05 AM    XR Chest AP Portable    Result Date: 09/07/2023   Left pigtail catheter in place. No change in the suspected small left pneumothorax however evaluation limited on frontal view. Consider lateral view  for further assessment. Charlott Rakes, MD 09/07/2023 5:00 PM    XR Chest AP Portable    Result Date: 09/07/2023  No significant change left pneumothorax with trace left effusion, chest place [name], MD 09/07/2023 8:26 AM    XR Chest AP Portable    Result Date: 09/06/2023  1. Small left pneumothorax, not significantly changed with pleural catheter in place. 2. Stable small left pleural effusion. Kennyth Lose, MD 09/06/2023 9:40 AM    XR Chest AP Portable    Result Date: 09/05/2023  Likely no significant change in the left pneumothorax with chest tube in place with underlying bullous emphysema [name], MD 09/05/2023 8:30 AM    XR Chest AP Portable    Result Date: 09/04/2023  No significant change left pneumothorax likely underestimated on radiograph due to severe bullous emphysema [name], MD 09/04/2023 8:09 AM    XR Chest AP Portable    Result Date: 09/03/2023  Left chest tube in place without apparent change in the left pneumothorax in this patient with bullous emphysema [name], MD 09/03/2023 12:04 PM    XR Chest  AP Portable    Result Date: 09/03/2023  No significant change left pneumothorax with chest tube [name], MD 09/03/2023 8:46 AM    XR Chest AP Portable    Result Date: 09/02/2023   Left pigtail catheter with decreased now small hydropneumothorax. Charlott Rakes, MD 09/02/2023 5:31 PM    CT Guided Insert Pleural Cath W/Image    Result Date: 09/02/2023   Technically successful left pleural catheter placement. Kristine Linea, MD 09/02/2023 2:03 PM    CT Chest without Contrast    Result Date: 09/01/2023  Moderate left-sided pneumothorax approximately 25 % of the volume. These critical findings earlier communicated on the radiograph interpretation. 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 Pankaj Dominica, MD 09/01/2023 7:10 PM    Chest AP Portable    Result Date: 09/01/2023   New left-sided pneumothorax. Critical results  were reported to and acknowledged by Dr. Dorris Fetch on 09/01/2023 5:18 PM. Wynema Birch, MD 09/01/2023 5:18 PM      Hospital Course:        [name] [name] is a 72 year old male with past medical history of BPH s/p TURP, severe bullous COPD,  aortic aneurysm, cachexia, hypertension, right-sided pneumothorax 2023 referred from PCP office to ED with shortness of breath, worsening fatigue and refusal to eat.  Found to have left-sided pneumothorax and is being admitted for further management. CT chest with moderate left-sided pneumothorax approximately 25% of the volume, severe bullous pulmonary emphysema, Staghorn right calculus and infrarenal abdominal aortic aneurysm.     He underwent talc pleurodesis 10/6.  CT removed 10/9 with residual small, but acceptable PTX still in place. He will need follow up with thoracic surgery as outpatient.     Patient has not seen pulmonology since 2023 and has since been lost to follow up.  He appears breathless at rest, has exertional tachycardia into the 130s - but reports functional improvement since his chest tube and treatments and felt comfortable going home.     Will discharge with duoneb and budesonide inhalers. He needs to establish care with pulmonology.     Some runs of NSVT <8 beats. Will defer initiation of beta blockers given severity of COPD, short runs.          Discharge Day Exam:  Temp:  [97.3 F (36.3 C)-98 F (36.7 C)] 98 F (36.7 C)  Heart Rate:  [103-120] 107  Resp Rate:  [16-22] 20  BP: (104-152)/(72-86) 129/86  General: no distress, alert and oriented  HEENT: sclera anicteric, EOM grossly intact  Chest: breathing symmetric, unlabored, regular HR  GI/GU: non-distended  Extremities: warm, no edema present  Neuro: moves all four extremities, no focal deficit  Psych: normal mood, appropriate affect      Consultations:     Treatment Team:   Attending Provider: Peterson Ao, MD    Discharge Condition:     Improving      Minutes spent coordinating discharge and reviewing  discharge plan:40 minutes    Discharge Medications:        Discharge Medication List        Taking      albuterol-ipratropium 2.5-0.5(3) mg/3 mL nebulizer  Dose: 3 mL  Commonly known as: DUO-NEB  For: Chronic Obstructive Lung Disease  Take 3 mLs by nebulization 4 (four) times daily     amLODIPine 2.5 MG tablet  Dose: 2.5 mg  Commonly known as: NORVASC  Take 1 tablet (2.5 mg) by mouth daily  budesonide 0.5 MG/2ML nebulizer solution  Dose: 0.5 mg  Commonly known as: PULMICORT  For: Chronic Obstructive Lung Disease  Take 2 mLs (0.5 mg) by nebulization daily     InnoSpire Essence Nebulizer Misc  Dose: 1 Unit  Use 1 Unit once for 1 dose                    [facility]  Department of Medicine  P: [phone]  F: [phone]    Signed by: Peterson Ao, MD, MD    CC: [place], Emogene Morgan, MD

## 2023-09-09 LAB — REFERRAL TO DISPATCH HEALTH - BRIDGE CARE: DISPATCHHEALTH: 20241011141740

## 2023-09-14 ENCOUNTER — Encounter (INDEPENDENT_AMBULATORY_CARE_PROVIDER_SITE_OTHER): Payer: Self-pay | Admitting: Family Medicine

## 2023-09-14 NOTE — Progress Notes (Signed)
Name: [facility]    ### Patient Details  Date of Birth: 08-03-1951  MRN: [id number]    ### Encounter Details  Arrival Date: 09/01/2023 11:47 PM EDT  Discharge Date: 09/08/2023 08:52 PM EDT  Encounter ID: [id number] TCM10/08/2023   8:52:00PM    ### Related interaction  [place] - TCM V2 Post Discharge Outreach (Post Discharge TCM V2 Outreach 1) ([email or web address])    ### Required Interventions and Feedback     Call Status         Call Status:     No Attempt (edited by TF on 09/14/2023 10:39 AM EDT)    Comments::     Patient received an automated post-discharge outreach call. Call attempt incomplete. No further action is needed at this time. (edited by TF on 09/14/2023 10:40 AM EDT)    Elie Confer) [facility], Ambulatory Care Management  [facility]   [address]  [place], [place] [address]  [place] [phone]

## 2023-09-22 ENCOUNTER — Encounter (INDEPENDENT_AMBULATORY_CARE_PROVIDER_SITE_OTHER): Payer: Self-pay

## 2023-09-23 ENCOUNTER — Encounter (INDEPENDENT_AMBULATORY_CARE_PROVIDER_SITE_OTHER): Payer: Self-pay | Admitting: Internal Medicine

## 2023-09-23 ENCOUNTER — Ambulatory Visit (INDEPENDENT_AMBULATORY_CARE_PROVIDER_SITE_OTHER): Payer: Medicare Other | Admitting: Internal Medicine

## 2023-09-23 VITALS — BP 102/68 | HR 127 | Temp 98.7°F | Resp 14 | Ht 62.0 in | Wt 85.0 lb

## 2023-09-23 DIAGNOSIS — J439 Emphysema, unspecified: Secondary | ICD-10-CM

## 2023-09-23 DIAGNOSIS — J939 Pneumothorax, unspecified: Secondary | ICD-10-CM

## 2023-09-23 DIAGNOSIS — J9621 Acute and chronic respiratory failure with hypoxia: Secondary | ICD-10-CM

## 2023-09-23 DIAGNOSIS — R0602 Shortness of breath: Secondary | ICD-10-CM

## 2023-09-23 MED ORDER — FLUTICASONE-UMECLIDIN-VILANT 100-62.5-25 MCG/ACT IN AEPB
1.0000 | INHALATION_SPRAY | Freq: Every day | RESPIRATORY_TRACT | 5 refills | Status: DC
Start: 2023-09-23 — End: 2024-03-27

## 2023-09-23 NOTE — Progress Notes (Signed)
[place] PULMONARY CONSULT    Patient Name: [name] [name],[name] [name]    Date of Visit:  09/23/2023  Date of Birth: 12-13-1950  AGE: 72 y.o.  Medical Record #: [id number]  Requesting Physician: Oswaldo Done, MD    HISTORY OF PRESENT ILLNESS:  [name] [name]  is a 72 y.o.  male who presents for new patient evaluation of COPD. He has a PMHx of severe emphysema, BPH s/p TURP, aortic aneurysm, cachexia, HTN, and PTX x2.     Initial Consult on 09/23/2023    He was seen in the ED and admitted 09/01/2023. He was referred from his PCP office with SOB, worsening fatigue and refusal to eat. He was found to have a left sided PTX and was admitted for further management. CT chest showed moderate left sided PTX and severe bullous pulmonary emphysema. He underwent pleurodesis 10/6 and the chest tube was removed 10/9 with a residual small PTX still in place.     He complained of breathlessness at rest, exertional tachycardia into the 130's.he was discharged on duonebs and budesonide inhalers.     He was seen 05/01/22 and admitted to the ICU for a COPD exacerbation and acute hypoxic respiratory failure. He had a right sided PTX with a chest tube placed 6/3. He was brought by EMS and his saturation was 86%. He was started on 5L which was switched to BiPAP. He does not take any inhalers at baseline and was a heavy smoker. He was started on decadron IV and scheduled nebs. He was treated with azithromycin as well. Chest tube was removed and a CXR showed persistent apical PTX.     Currently he has supplemental O2 and uses it PRN. He was not on supplemental O2 prior to his most recent hospitalization. He denies wheezing, cough, fevers or chills. He has not seen pulmonology as outpatient in the past. He does have exertional dyspnea. He estimates he can walk 18ft before becoming short of breath.     MEDICATIONS:    Current Outpatient Medications:     albuterol-ipratropium (DUO-NEB) 2.5-0.5(3) mg/3 mL nebulizer, Take 3 mLs by nebulization 4 (four) times  daily, Disp: 252 mL, Rfl: 0    amLODIPine (NORVASC) 2.5 MG tablet, Take 1 tablet (2.5 mg) by mouth daily, Disp: 90 tablet, Rfl: 1    budesonide (PULMICORT) 0.5 MG/2ML nebulizer solution, Take 2 mLs (0.5 mg) by nebulization daily, Disp: 42 mL, Rfl: 0      PHYSICAL EXAM:  Vitals:    09/23/23 1511   BP: 102/68   Pulse: (!) 127   Resp: 14   Temp: 98.7 F (37.1 C)   SpO2: 93%      Physical Exam  Constitutional:       Appearance: Normal appearance. He is normal weight.   HENT:      Head: Normocephalic and atraumatic.      Nose: Nose normal.   Eyes:      Pupils: Pupils are equal, round, and reactive to light.   Cardiovascular:      Rate and Rhythm: Normal rate and regular rhythm.   Pulmonary:      Effort: Pulmonary effort is normal.      Breath sounds: Normal breath sounds.   Musculoskeletal:         General: Normal range of motion.      Cervical back: Normal range of motion.   Skin:     General: Skin is warm and dry.   Neurological:  Mental Status: He is alert.         PULMONARY DIAGNOSTICS:    Spirometry 09/23/2023 FVC: 2.82 (56%), FEV1: 2.19 (26%), FEV1/FVC: 78%--  very severe obstruction     09/23/2023: Pt walked 328ft in 6 min, O2 95%, HR 140bpm, borg 10         LABS:        IMAGING:    CT chest 09/01/2023: Moderate left-sided pneumothorax approximately 25 % of the volume. These critical findings earlier communicated on the radiograph interpretation. 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    CT chest 05/07/2022: Resolution of previously seen pneumothorax seen on the computed tomography scan of 05/01/2022. Severe bilateral bullous changes     CT chest 05/01/2022: Extensive emphysematous changes and parenchymal scarring. There is a moderate size right-sided pneumothorax. The lungs however remain relatively well expanded. Continued follow-up is recommended       IMPRESSION:  [name] [name] is a 72 y.o. male with the following  problems:    Severe emphysema   Sever bullous emphysema seen on his imaging.   Very severe obstruction on spirometry  Needs to be on daily inhaler. I will send Trelegy to his pharmacy.     Acute on chronic hypoxic respiratory failure   Improved  He did not desaturate on a today in the office - though did desaturate at time of hospital discharge 1 week prior.     Hx PTX s/p talc pleurodesis 08/2023    RECOMMENDATIONS:  Start Trelegy 200 1p QD -- Patient aware to rinse and spit after use to prevent thrush.   Continue albuterol 2p Q4-6H PRN    Continue supplemental O2 as needed   Increase activity  Follow up in 6 weeks    No follow-ups on file.                                               Orders Placed This Encounter   Procedures    Spirometry       A total of 60 minutes was spent on this visit reviewing previous notes, counseling the patient and/or family members regarding the patient's condition(s), ordering of tests, managing medications, and documenting the findings in the note.        SIGNED:    Kittie Plater

## 2023-09-26 LAB — SPIROMETRY
FEF 25-75% (Pre-Bronch) %Pred: 11 %
FEF 25-75% (Pre-Bronch) Actual: 0.21 L/s
FEF 25-75% (Pre-Bronch) Pred: 1.75 L/s
FEF Max (Pre-Bronch) Actual: 1.29 L/s
FEF2575 (Lower Limit of Normal): 0.73 L/s
FEF2575 (Standard Deviation): 0.76 L/s
FEV1 (Lower Limit of Normal): 1.55 L
FEV1 (Pre-Bronch) %Pred: 26 %
FEV1 (Pre-Bronch) Actual: 0.58 L
FEV1 (Pre-Bronch) Pred: 2.19 L
FEV1 (Standard Deviation): 0.37 L
FEV1/FVC (Pre-Bronch) %Pred: 46 %
FEV1/FVC (Pre-Bronch) Actual: 36 %
FEV1/FVC (Pre-Bronch) Pred: 78 %
FVC (Lower Limit of Normal): 2.1 L
FVC (Pre-Bronch) %Pred: 56 %
FVC (Pre-Bronch) Actual: 1.59 L
FVC (Pre-Bronch) Pred: 2.82 L
FVC (Standard Deviation): 0.44 L
PEF (Pre-Actual): 77.6 L/min

## 2023-09-28 ENCOUNTER — Encounter (INDEPENDENT_AMBULATORY_CARE_PROVIDER_SITE_OTHER): Payer: Self-pay

## 2023-10-07 ENCOUNTER — Encounter (INDEPENDENT_AMBULATORY_CARE_PROVIDER_SITE_OTHER): Payer: Self-pay

## 2023-10-20 ENCOUNTER — Ambulatory Visit (INDEPENDENT_AMBULATORY_CARE_PROVIDER_SITE_OTHER): Payer: Medicare Other | Admitting: Internal Medicine

## 2023-10-20 ENCOUNTER — Encounter (INDEPENDENT_AMBULATORY_CARE_PROVIDER_SITE_OTHER): Payer: Self-pay | Admitting: Internal Medicine

## 2023-10-20 VITALS — BP 132/82 | HR 89 | Temp 98.0°F | Resp 16 | Ht 62.0 in | Wt 84.2 lb

## 2023-10-20 DIAGNOSIS — J439 Emphysema, unspecified: Secondary | ICD-10-CM

## 2023-10-20 DIAGNOSIS — R0602 Shortness of breath: Secondary | ICD-10-CM

## 2023-10-20 DIAGNOSIS — J9621 Acute and chronic respiratory failure with hypoxia: Secondary | ICD-10-CM

## 2023-10-20 DIAGNOSIS — J939 Pneumothorax, unspecified: Secondary | ICD-10-CM

## 2023-10-20 NOTE — Progress Notes (Signed)
 [facility]    Patient Name: [name] [name],[name] [name]    Date of Visit:  10/20/2023  Date of Birth: 15-Oct-1951  AGE: 72 y.o.  Medical Record #: [id number]  Requesting Physician: Oswaldo Done, MD    HISTORY OF PRESENT ILLNESS:  [name] [name]  is a 49 y.

## 2023-10-21 LAB — SPIROMETRY
FEF 25-75% (Pre-Bronch) %Pred: 12 %
FEF 25-75% (Pre-Bronch) Actual: 0.21 L/s
FEF 25-75% (Pre-Bronch) Pred: 1.75 L/s
FEF Max (Pre-Bronch) Actual: 1.51 L/s
FEF2575 (Lower Limit of Normal): 0.73 L/s
FEF2575 (Standard Deviation): 0.76 L/s
FEV1 (Lower Limit of Normal): 1.55 L
FEV1 (Pre-Bronch) %Pred: 29 %
FEV1 (Pre-Bronch) Actual: 0.64 L
FEV1 (Pre-Bronch) Pred: 2.19 L
FEV1 (Standard Deviation): 0.37 L
FEV1/FVC (Pre-Bronch) %Pred: 43 %
FEV1/FVC (Pre-Bronch) Actual: 34 %
FEV1/FVC (Pre-Bronch) Pred: 78 %
FVC (Lower Limit of Normal): 2.1 L
FVC (Pre-Bronch) %Pred: 66 %
FVC (Pre-Bronch) Actual: 1.87 L
FVC (Pre-Bronch) Pred: 2.82 L
FVC (Standard Deviation): 0.44 L
PEF (Pre-Actual): 90.6 L/min

## 2023-11-04 ENCOUNTER — Encounter (INDEPENDENT_AMBULATORY_CARE_PROVIDER_SITE_OTHER): Payer: Self-pay | Admitting: Family Medicine

## 2023-11-06 ENCOUNTER — Encounter (INDEPENDENT_AMBULATORY_CARE_PROVIDER_SITE_OTHER): Payer: Self-pay

## 2023-12-07 ENCOUNTER — Encounter (INDEPENDENT_AMBULATORY_CARE_PROVIDER_SITE_OTHER): Payer: Self-pay

## 2024-01-07 ENCOUNTER — Encounter (INDEPENDENT_AMBULATORY_CARE_PROVIDER_SITE_OTHER): Payer: Self-pay

## 2024-01-24 ENCOUNTER — Ambulatory Visit (INDEPENDENT_AMBULATORY_CARE_PROVIDER_SITE_OTHER): Payer: Medicare Other | Admitting: Internal Medicine

## 2024-02-04 ENCOUNTER — Encounter (INDEPENDENT_AMBULATORY_CARE_PROVIDER_SITE_OTHER): Payer: Self-pay

## 2024-03-06 ENCOUNTER — Encounter (INDEPENDENT_AMBULATORY_CARE_PROVIDER_SITE_OTHER): Payer: Self-pay

## 2024-03-24 ENCOUNTER — Other Ambulatory Visit (INDEPENDENT_AMBULATORY_CARE_PROVIDER_SITE_OTHER): Payer: Self-pay | Admitting: Internal Medicine

## 2024-03-26 NOTE — Telephone Encounter (Signed)
 RX pending,   please advise on DX

## 2024-04-05 ENCOUNTER — Encounter (INDEPENDENT_AMBULATORY_CARE_PROVIDER_SITE_OTHER): Payer: Self-pay

## 2024-05-06 ENCOUNTER — Encounter (INDEPENDENT_AMBULATORY_CARE_PROVIDER_SITE_OTHER): Payer: Self-pay

## 2024-06-05 ENCOUNTER — Encounter (INDEPENDENT_AMBULATORY_CARE_PROVIDER_SITE_OTHER): Payer: Self-pay

## 2024-06-07 ENCOUNTER — Encounter (INDEPENDENT_AMBULATORY_CARE_PROVIDER_SITE_OTHER): Payer: Self-pay | Admitting: Family Medicine

## 2024-06-11 ENCOUNTER — Emergency Department

## 2024-06-11 ENCOUNTER — Emergency Department: Admission: EM | Admit: 2024-06-11 | Discharge: 2024-06-11 | Disposition: A | Discharge status: Home / Self Care

## 2024-06-11 DIAGNOSIS — N3090 Cystitis, unspecified without hematuria: Secondary | ICD-10-CM | POA: Insufficient documentation

## 2024-06-11 DIAGNOSIS — I7143 Infrarenal abdominal aortic aneurysm, without rupture: Secondary | ICD-10-CM | POA: Insufficient documentation

## 2024-06-11 DIAGNOSIS — M545 Low back pain, unspecified: Secondary | ICD-10-CM

## 2024-06-11 LAB — LAB USE ONLY - CBC WITH DIFFERENTIAL
Absolute Basophils: 0.05 x10 3/uL (ref 0.00–0.08)
Absolute Eosinophils: 0.01 x10 3/uL (ref 0.00–0.44)
Absolute Immature Granulocytes: 0.04 x10 3/uL (ref 0.00–0.07)
Absolute Lymphocytes: 0.89 x10 3/uL (ref 0.42–3.22)
Absolute Monocytes: 0.21 x10 3/uL (ref 0.21–0.85)
Absolute Neutrophils: 7.67 x10 3/uL — ABNORMAL HIGH (ref 1.10–6.33)
Absolute nRBC: 0 x10 3/uL (ref <=0.00)
Basophils %: 0.6 %
Eosinophils %: 0.1 %
Hematocrit: 40.7 % (ref 37.6–49.6)
Hemoglobin: 13.1 g/dL (ref 12.5–17.1)
Immature Granulocytes %: 0.5 %
Lymphocytes %: 10 %
MCH: 28 pg (ref 25.1–33.5)
MCHC: 32.2 g/dL (ref 31.5–35.8)
MCV: 87 fL (ref 78.0–96.0)
MPV: 9.1 fL (ref 8.9–12.5)
Monocytes %: 2.4 %
Neutrophils %: 86.4 %
Platelet Count: 275 x10 3/uL (ref 142–346)
Preliminary Absolute Neutrophil Count: 7.67 x10 3/uL — ABNORMAL HIGH (ref 1.10–6.33)
RBC: 4.68 x10 6/uL (ref 4.20–5.90)
RDW: 13 % (ref 11–15)
WBC: 8.87 x10 3/uL (ref 3.10–9.50)
nRBC %: 0 /100{WBCs} (ref <=0.0)

## 2024-06-11 LAB — COMPREHENSIVE METABOLIC PANEL
ALT: 22 U/L (ref <=55)
AST (SGOT): 26 U/L (ref <=41)
Albumin/Globulin Ratio: 1.1 (ref 0.9–2.2)
Albumin: 3.7 g/dL (ref 3.5–5.0)
Alkaline Phosphatase: 86 U/L (ref 37–117)
Anion Gap: 8 (ref 5.0–15.0)
BUN: 17 mg/dL (ref 9–28)
Bilirubin, Total: 0.6 mg/dL (ref 0.2–1.2)
CO2: 25 meq/L (ref 17–29)
Calcium: 9.1 mg/dL (ref 7.9–10.2)
Chloride: 104 meq/L (ref 99–111)
Creatinine: 0.8 mg/dL (ref 0.5–1.5)
GFR: >60 mL/min/1.73 m2 (ref >=60.0)
Globulin: 3.4 g/dL (ref 2.0–3.6)
Glucose: 141 mg/dL — ABNORMAL HIGH (ref 70–100)
Potassium: 4.5 meq/L (ref 3.5–5.3)
Protein, Total: 7.1 g/dL (ref 6.0–8.3)
Sodium: 137 meq/L (ref 135–145)

## 2024-06-11 LAB — URINALYSIS WITH REFLEX TO MICROSCOPIC EXAM - REFLEX TO CULTURE
Urine Bilirubin: NEGATIVE
Urine Blood: NEGATIVE
Urine Glucose: NEGATIVE
Urine Nitrite: POSITIVE — AB
Urine Specific Gravity: >1.05 — ABNORMAL HIGH (ref 1.001–1.035)
Urine Urobilinogen: NORMAL mg/dL (ref 0.2–2.0)
Urine pH: 7 (ref 5.0–8.0)

## 2024-06-11 LAB — LAB USE ONLY - URINE GRAY CULTURE HOLD TUBE

## 2024-06-11 LAB — LACTIC ACID: Whole Blood Lactic Acid: 2 mmol/L (ref 0.2–2.0)

## 2024-06-11 MED ORDER — CEFDINIR 300 MG PO CAPS
300.0000 mg | ORAL_CAPSULE | Freq: Two times a day (BID) | ORAL | 0 refills | Status: AC
Start: 2024-06-11 — End: 2024-06-18

## 2024-06-11 MED ORDER — METHOCARBAMOL 500 MG PO TABS
1000.0000 mg | ORAL_TABLET | Freq: Once | ORAL | Status: AC
Start: 2024-06-11 — End: 2024-06-11
  Administered 2024-06-11: 1000 mg via ORAL
  Filled 2024-06-11: qty 2

## 2024-06-11 MED ORDER — IOHEXOL 350 MG/ML IV SOLN
100.0000 mL | Freq: Once | INTRAVENOUS | Status: AC | PRN
Start: 2024-06-11 — End: 2024-06-11
  Administered 2024-06-11: 100 mL via INTRAVENOUS
  Filled 2024-06-11: qty 100

## 2024-06-11 MED ORDER — ACETAMINOPHEN 325 MG PO TABS
650.0000 mg | ORAL_TABLET | Freq: Once | ORAL | Status: AC
Start: 2024-06-11 — End: 2024-06-11
  Administered 2024-06-11: 650 mg via ORAL
  Filled 2024-06-11: qty 2

## 2024-06-11 MED ORDER — MORPHINE SULFATE 2 MG/ML IJ/IV SOLN (WRAP)
2.0000 mg | Freq: Once | Status: AC
Start: 2024-06-11 — End: 2024-06-11
  Administered 2024-06-11: 2 mg via INTRAVENOUS
  Filled 2024-06-11: qty 1

## 2024-06-11 MED ORDER — STERILE WATER FOR INJECTION IJ/IV SOLN (WRAP)
1.0000 g | Freq: Once | Status: AC
Start: 2024-06-11 — End: 2024-06-11
  Administered 2024-06-11: 1 g via INTRAVENOUS
  Filled 2024-06-11: qty 1000

## 2024-06-11 MED ORDER — LIDOCAINE 5 % EX PTCH
1.0000 | MEDICATED_PATCH | CUTANEOUS | Status: DC
Start: 2024-06-11 — End: 2024-06-11
  Administered 2024-06-11: 1 via TRANSDERMAL
  Filled 2024-06-11: qty 1

## 2024-06-11 MED ORDER — ONDANSETRON HCL 4 MG/2ML IJ SOLN
4.0000 mg | Freq: Once | INTRAMUSCULAR | Status: AC
Start: 2024-06-11 — End: 2024-06-11
  Administered 2024-06-11: 4 mg via INTRAVENOUS
  Filled 2024-06-11: qty 2

## 2024-06-11 MED ORDER — SODIUM CHLORIDE 0.9 % IV BOLUS
500.0000 mL | Freq: Once | INTRAVENOUS | Status: AC
Start: 2024-06-11 — End: 2024-06-11
  Administered 2024-06-11: 500 mL via INTRAVENOUS
  Filled 2024-06-11: qty 500

## 2024-06-11 MED ORDER — KETOROLAC TROMETHAMINE 30 MG/ML IJ SOLN
15.0000 mg | Freq: Once | INTRAMUSCULAR | Status: AC
Start: 2024-06-11 — End: 2024-06-11
  Administered 2024-06-11: 15 mg via INTRAVENOUS
  Filled 2024-06-11: qty 1

## 2024-06-11 NOTE — Discharge Instructions (Signed)
 Please follow-up with the vascular surgeon that we have provided for you.  If you develop any new or worsening symptoms please return to the emergency department.

## 2024-06-11 NOTE — ED Provider Notes (Signed)
 [facility]  Emergency Department APP Note      Diagnosis/Disposition     ED Disposition:  Discharge from ED Observation    ED Diagnosis:     Acute right-sided low back pain without sciatica  Infrarenal abdominal aortic aneurysm (AAA) without rupture  Cystitis    Discharge Medication List as of 06/11/2024  3:20 PM        START taking these medications    Details   cefdinir  (OMNICEF ) 300 MG capsule Take 1 capsule (300 mg) by mouth 2 (two) times daily for 7 days, Starting Mon 06/11/2024, Until Mon 06/18/2024, E-Rx             History of Present Illness     Chief Complaint: Back Pain     73 y.o. male with past medical history as below   History of Present Illness  [name] [name] is a 73 year old male who presents with acute onset of severe pain since last night.    Severe pain began at 8 PM last night and is persistent. No recent falls or heavy lifting. No nausea, vomiting, diarrhea, fever, or abdominal pain. Pain does not radiate to the leg, abdomen, or groin. No dysuria, hematuria, groin or testicular pain, chest pain, or dyspnea. No loss of bowel or bladder control, numbness, or tingling in the legs.  The patient needed interpreter services.  Family member interpreted.  Patient declined language line interpreter.    Independent Historian (other than patient): Family (list in HPI)  Additional History Provided by Independent Historian:      Physical Exam     ED Triage Vitals [06/11/24 0847]   Encounter Vitals Group      BP 157/87      Systolic BP Percentile       Diastolic BP Percentile       Heart Rate 100      Resp Rate 16      Temp 97.4 F (36.3 C)      Temp src Temporal      SpO2 93 %      Weight       Height       Head Circumference       Peak Flow       Pain Score 8      Pain Loc       Pain Education       Exclude from Growth Chart       Physical Exam   Physical Exam  CONSTITUIONAL: Vitals and nursing note reviewed. No acute distress. Well developed.  HEAD: Normocephalic atraumatic.  EYES: Pupils  equal, round, and reactive to light. Normal conjunctiva.  ENT: External ears normal. Nose normal. Moist mucous membranes.  NECK: Trachea midline, supple.  CARDIOVASCULAR: Normal rate and rhythm.  RESPIRATORY: Normal breath sounds. Normal effort.  ABDOMINAL: RLQ pain on palpation. No rebound. No guarding. No pulsatile mass.  NEUROVASCULAR: Alert and cooperative. Normal strength. Normal sensation.  MUSCULOSKELETAL: Paraspinal right lumbar tenderness. Leg lifting does not worsen pain. Normal range of motion.          Medical Decision Making        PRIMARY PROBLEM LIST      1. Acute illness/injury with risk to life or bodily function (based on differential diagnosis or evaluation) DIAGNOSIS:Cystitis, infrarenal abdominal aortic aneurysm, back pain  Chronic Illness Impacting Care of the above problem: Advanced age Increases complexity of evaluation and Increases the risk of severe disease   Differential Diagnosis:  Neck/back pain non traumatic:  sprain, disc herniation/bulge, spasm, meningitis, arthritis, infection, compression fracture, stone     DISCUSSION      73 year old male presenting for lower back pain.  Given his age and radiation to the abdomen we will proceed with blood work CT abdomen pelvis with oral spine recon.  He has no signs or symptoms concerning for cauda equina.  Blood work is grossly unremarkable L-spine shows no acute abnormalities.  CT abdomen pelvis shows an incidental finding of a partially thrombosed infrarenal abdominal aortic aneurysm discussed with vascular surgery on-call who states this is a chronic finding no need for medications at this time but needs follow-up in office with vascular surgery.  Will place consult at this time.  UA is consistent with cystitis blood culture and lactic acid sent.  Lactic acid is borderline provided 500 of normal saline in the emergency department.  Discussed admission versus discharge home with antibiotics with strict return precautions.  Family would like  to be discharged home with antibiotics they will return if any symptoms worsen.  Discussed all test results and plan of care with family and patient they are agreeable to disposition plan.      The patient was deemed stable for discharge. They were given strict return precautions as it relates to their presumed diagnosis, verbalized understanding of these precautions and agreed to follow up as instructed. All questions were answered prior to discharge.    Additional Notes      External Records Reviewed?: Physician Office Records      Was management discussed with a consultant?: Yes (explain) Vascular Surgeon On Call Dr. Teresa states that pt needs to follow up outpatient, no medication adjustments at this time.          Vital Signs: Reviewed the patient's vital signs.   Nursing Notes: Reviewed and utilized available nursing notes.   Medical Records Reviewed: Reviewed available past medical records.   Counseling: The emergency provider has spoken with the patient and discussed today's findings, in addition to providing specific details for the plan of care. Questions are answered and there is agreement with the plan.     CRITICAL CARE/PROCEDURES    Procedures       O2 Sat:  The patient's oxygen saturation was 93 % on room air. This was independently interpreted by me as borderline normal.       EMERGENCY IMAGING STUDIES      The following imaging studies were independently interpreted by me (emergency medicine provider):     CT Abdomen/Pelvis Interpreted by me (ED Provider)   Comparison: Yes.  No acute changes.  DATE: 08/27/22  RESULT: Other (explain)   IMPRESSION: Other (describe)  infrarenal abdominal aortic aneurysm          Supplemental Encounter Data   Medical History[1]  Past Surgical History[2]  Social History[3]  Family History[4]  Allergies[5]    Encounter Orders:  Orders Placed This Encounter   Procedures    Culture, Urine    Culture, Blood, Aerobic And Anaerobic    Culture, Blood, Aerobic And Anaerobic     CT Abd/Pelvis with IV Contrast only    CT L-spine Reconstruction    CBC with Differential (Order)    Comprehensive Metabolic Panel    Urinalysis with Reflex to Microscopic Exam and Culture    Urine Elnor Culture Hold Tube    CBC with Differential (Component)    Lactic Acid    Referral Vascular Surgery    Saline lock IV  Request for Transfer Center     Medications Administered:  Medications   lidocaine  (LIDODERM ) 5 % 1 patch (1 patch Transdermal Patch Applied 06/11/24 0930)   ketorolac  (TORADOL ) injection 15 mg (15 mg Intravenous Given 06/11/24 0930)   methocarbamol  (ROBAXIN ) tablet 1,000 mg (1,000 mg Oral Given 06/11/24 0930)   iohexol  (OMNIPAQUE ) 350 MG/ML injection 100 mL (100 mLs Intravenous Imaging Agent Given 06/11/24 1210)   morphine  injection 2 mg (2 mg Intravenous Given 06/11/24 1343)   ondansetron  (ZOFRAN ) injection 4 mg (4 mg Intravenous Given 06/11/24 1343)   acetaminophen  (TYLENOL ) tablet 650 mg (650 mg Oral Given 06/11/24 1343)   cefTRIAXone  (ROCEPHIN ) 1 g in sterile water  (preservative free) 10 mL IV push injection (1 g Intravenous Given 06/11/24 1506)   sodium chloride  0.9 % bolus 500 mL (0 mLs Intravenous Stopped 06/11/24 1548)     Laboratory and Imaging Studies:  Results for orders placed or performed during the hospital encounter of 06/11/24 (from the past 24 hours)   Comprehensive Metabolic Panel    Collection Time: 06/11/24  9:28 AM   Result Value    Glucose 141 (H)    BUN 17    Creatinine 0.8    Sodium 137    Potassium 4.5    Chloride 104    CO2 25    Calcium 9.1    Anion Gap 8.0    GFR >60.0    AST (SGOT) 26    ALT 22    Alkaline Phosphatase 86    Albumin 3.7    Protein, Total 7.1    Globulin 3.4    Albumin/Globulin Ratio 1.1    Bilirubin, Total 0.6   CBC with Differential (Component)    Collection Time: 06/11/24  9:28 AM   Result Value    WBC 8.87    Hemoglobin 13.1    Hematocrit 40.7    Platelet Count 275    MPV 9.1    RBC 4.68    MCV 87.0    MCH 28.0    MCHC 32.2    RDW 13    nRBC % 0.0     Absolute nRBC 0.00    Preliminary Absolute Neutrophil Count 7.67 (H)    Neutrophils % 86.4    Lymphocytes % 10.0    Monocytes % 2.4    Eosinophils % 0.1    Basophils % 0.6    Immature Granulocytes % 0.5    Absolute Neutrophils 7.67 (H)    Absolute Lymphocytes 0.89    Absolute Monocytes 0.21    Absolute Eosinophils 0.01    Absolute Basophils 0.05    Absolute Immature Granulocytes 0.04   Urinalysis with Reflex to Microscopic Exam and Culture    Collection Time: 06/11/24  1:48 PM    Specimen: Urine, Clean Catch   Result Value    Urine Color Straw    Urine Clarity Clear    Urine Specific Gravity >1.050 (H)    Urine pH 7.0    Urine Leukocyte Esterase Large (A)    Urine Nitrite Positive (A)    Urine Protein 10= Trace (A)    Urine Glucose Negative    Urine Ketones Trace (A)    Urine Urobilinogen Normal    Urine Bilirubin Negative    Urine Blood Negative    RBC, UA 11-25 (A)    Urine WBC Too numerous to count (A)    Urine Squamous Epithelial Cells 0-5   Lactic Acid    Collection Time: 06/11/24  3:01  PM   Result Value    Whole Blood Lactic Acid 2.0     CT L-spine Reconstruction   Final Result      No acute lumbar spine fracture.      Gladis Minks, MD   06/11/2024 12:21 PM      CT Abd/Pelvis with IV Contrast only   Final Result         1.Right upper pole nephrolithiasis with focal caliectasis.   2.Partially thrombosed infrarenal abdominal aortic aneurysm measuring up to   3.7 cm not significantly changed.      Pankaj Dominica, MD   06/11/2024 12:28 PM        Attestations     Discussed case with ED attending, No att. providers found, who is agreeable to the plan of care, treatment and disposition.          [1]   Past Medical History:  Diagnosis Date    Bilateral cataracts 01/12/2023    Collapsed lung     Emphysema lung (CMS/HCC)     Enlarged prostate    [2]   Past Surgical History:  Procedure Laterality Date    CYSTOSCOPY, LITHOLAPAXY N/A 01/04/2020    Procedure: PHYLLIS PAE;  Surgeon: Ina Hosey FALCON, MD;  Location:  [id number];  Service: Urology;  Laterality: N/A;  CYSTOLITHOLAPAXY    CYSTOSCOPY, TURP N/A 09/27/2022    Procedure: CYSTOSCOPY, TRANSURETHRAL RESECTION OF PROSTATE , TURP, CYSTOLITHOPLAXY;  Surgeon: Ina Hosey FALCON, MD;  Location: [facility];  Service: Urology;  Laterality: N/A;    PROSTATE SURGERY  2019   [3]   Social History  Tobacco Use    Smoking status: Former     Current packs/day: 0.00     Average packs/day: 0.3 packs/day for 50.0 years (12.5 ttl pk-yrs)     Types: Cigarettes     Start date: 06/26/1972     Quit date: 06/26/2022     Years since quitting: 1.9     Passive exposure: Never    Smokeless tobacco: Never   Vaping Use    Vaping status: Never Used   Substance Use Topics    Alcohol use: Never    Drug use: Never   [4]   Family History  Problem Relation Name Age of Onset    No known problems Mother      No known problems Father     [5]   Allergies  Allergen Reactions    Penicillins Diarrhea and Nausea And Vomiting        Gretel Dorothe NOVAK, FNP  06/11/24 1650

## 2024-06-11 NOTE — ED Notes (Signed)
 Lab called and notified lactic sent.

## 2024-06-11 NOTE — ED to IP RN Note (Signed)
 [facility]  ED NURSING NOTE FOR THE RECEIVING INPATIENT NURSE   ED NURSE [facility] [phone]   ED CHARGE RN TC   ADMISSION INFORMATION   [name] [name] is a 73 y.o. male admitted with an ED diagnosis of:    1. Acute right-sided low back pain without sciatica    2. Infrarenal abdominal aortic aneurysm (AAA) without rupture         Isolation: None   Allergies: Penicillins   Holding Orders confirmed? No   Belongings Documented? No   Home medications sent to pharmacy confirmed? N/A   NURSING CARE   Patient Comes From:   Mental Status: Home/Family Care  alert   ADL: Needs assistance with ADLs   Ambulation: Did not assess, but told ambulatory upon arrival.   Pertinent Information  and Safety Concerns:     Broset Violence Risk Level: Low Patient ambulatory upon arrival per report, desire to be admitted for pain management.  Primary spoken language is [place]  Daughter at bedside.     CT / NIH   CT Head ordered on this patient?  No   NIH/Dysphagia assessment done prior to admission? No   VITAL SIGNS (at the time of this note)      Vitals:    06/11/24 1400   BP: 164/78   Pulse: 70   Resp: 15   Temp: 98.5 F (36.9 C)   SpO2: 97%     Pain Score: 8-severe pain (06/11/24 0847)

## 2024-06-11 NOTE — ED Provider Notes (Signed)
 [facility]  Emergency Department Physician Attestation Note        I performed the substantive portion of the MDM. For the problems addressed, I personally developed, reviewed, and/or approved the plan and assessment as documented by the APP.  Patient was seen by APP, see their note for further documentation of history and exam.    Brief History of Present Illness and Additional Exam Findings     Chief Complaint: Back Pain       History per APP    73 y.o. male presents with right lower back pain    Triage Vitals:  ED Triage Vitals [06/11/24 0847]   Encounter Vitals Group      BP 157/87      Systolic BP Percentile       Diastolic BP Percentile       Heart Rate 100      Resp Rate 16      Temp 97.4 F (36.3 C)      Temp src Temporal      SpO2 93 %      Weight       Height       Head Circumference       Peak Flow       Pain Score 8      Pain Loc       Pain Education       Exclude from Growth Chart             Medical Decision Making         Vital Signs: Reviewed the patient's vital signs.   Nursing Notes: Reviewed and utilized available nursing notes.  Medical Records Reviewed: Reviewed available past medical records.    CARDIAC STUDIES    The following cardiac studies were independently interpreted by me the Emergency Medicine Physician.  For full cardiac study results please see chart. I discussed testing results with the APP.              EMERGENCY IMAGING STUDIES    The following imagine studies were independently interpreted by me (emergency medicine physician). I discussed testing results with the APP.                     RADIOLOGY IMAGING STUDIES      CT L-spine Reconstruction   Final Result      No acute lumbar spine fracture.      Gladis Minks, MD   06/11/2024 12:21 PM      CT Abd/Pelvis with IV Contrast only   Final Result         1.Right upper pole nephrolithiasis with focal caliectasis.   2.Partially thrombosed infrarenal abdominal aortic aneurysm measuring up to   3.7 cm not significantly  changed.      Pankaj Dominica, MD   06/11/2024 12:28 PM          EMERGENCY DEPT. MEDICATIONS      ED Medication Orders (From admission, onward)      Start Ordered     Status Ordering Provider    06/11/24 1511 06/11/24 1510  sodium chloride  0.9 % bolus 500 mL  Once        Route: Intravenous  Ordered Dose: 500 mL       Last MAR action: Stopped RATLIFF, SHEILA B    06/11/24 1445 06/11/24 1444  cefTRIAXone  (ROCEPHIN ) 1 g in sterile water  (preservative free) 10 mL IV push injection  Once  Route: Intravenous  Ordered Dose: 1 g       Last MAR action: Given RATLIFF, SHEILA B    06/11/24 1317 06/11/24 1316  acetaminophen  (TYLENOL ) tablet 650 mg  Once        Route: Oral  Ordered Dose: 650 mg       Last MAR action: Given RATLIFF, SHEILA B    06/11/24 1315 06/11/24 1314  morphine  injection 2 mg  Once        Route: Intravenous  Ordered Dose: 2 mg       Last MAR action: Given RATLIFF, SHEILA B    06/11/24 1315 06/11/24 1314  ondansetron  (ZOFRAN ) injection 4 mg  Once        Route: Intravenous  Ordered Dose: 4 mg       Last MAR action: Given RATLIFF, SHEILA B    06/11/24 1210 06/11/24 1210  iohexol  (OMNIPAQUE ) 350 MG/ML injection 100 mL  IMG once as needed        Route: Intravenous  Ordered Dose: 100 mL       Last MAR action: Imaging Agent Given RATLIFF, SHEILA B    06/11/24 0921 06/11/24 0920  ketorolac  (TORADOL ) injection 15 mg  Once        Route: Intravenous  Ordered Dose: 15 mg       Last MAR action: Given RATLIFF, SHEILA B    06/11/24 0921 06/11/24 0920    Every 24 hours        Route: Transdermal  Ordered Dose: 1 patch       Discontinued RATLIFF, SHEILA B    06/11/24 0921 06/11/24 0920  methocarbamol  (ROBAXIN ) tablet 1,000 mg  Once        Route: Oral  Ordered Dose: 1,000 mg       Last MAR action: Given RATLIFF, SHEILA B            LABORATORY RESULTS    Ordered and independently interpreted AVAILABLE laboratory tests.   Results for orders placed or performed during the hospital encounter of 06/11/24 (from the past 24 hours)    Comprehensive Metabolic Panel    Collection Time: 06/11/24  9:28 AM   Result Value    Glucose 141 (H)    BUN 17    Creatinine 0.8    Sodium 137    Potassium 4.5    Chloride 104    CO2 25    Calcium 9.1    Anion Gap 8.0    GFR >60.0    AST (SGOT) 26    ALT 22    Alkaline Phosphatase 86    Albumin 3.7    Protein, Total 7.1    Globulin 3.4    Albumin/Globulin Ratio 1.1    Bilirubin, Total 0.6   CBC with Differential (Component)    Collection Time: 06/11/24  9:28 AM   Result Value    WBC 8.87    Hemoglobin 13.1    Hematocrit 40.7    Platelet Count 275    MPV 9.1    RBC 4.68    MCV 87.0    MCH 28.0    MCHC 32.2    RDW 13    nRBC % 0.0    Absolute nRBC 0.00    Preliminary Absolute Neutrophil Count 7.67 (H)    Neutrophils % 86.4    Lymphocytes % 10.0    Monocytes % 2.4    Eosinophils % 0.1    Basophils % 0.6    Immature Granulocytes %  0.5    Absolute Neutrophils 7.67 (H)    Absolute Lymphocytes 0.89    Absolute Monocytes 0.21    Absolute Eosinophils 0.01    Absolute Basophils 0.05    Absolute Immature Granulocytes 0.04   Urinalysis with Reflex to Microscopic Exam and Culture    Collection Time: 06/11/24  1:48 PM    Specimen: Urine, Clean Catch   Result Value    Urine Color Straw    Urine Clarity Clear    Urine Specific Gravity >1.050 (H)    Urine pH 7.0    Urine Leukocyte Esterase Large (A)    Urine Nitrite Positive (A)    Urine Protein 10= Trace (A)    Urine Glucose Negative    Urine Ketones Trace (A)    Urine Urobilinogen Normal    Urine Bilirubin Negative    Urine Blood Negative    RBC, UA 11-25 (A)    Urine WBC Too numerous to count (A)    Urine Squamous Epithelial Cells 0-5   Urine Elnor Culture Hold Tube    Collection Time: 06/11/24  1:48 PM   Result Value    Extra Tube Hold for add-ons.   Lactic Acid    Collection Time: 06/11/24  3:01 PM   Result Value    Whole Blood Lactic Acid 2.0         CRITICAL CARE/PROCEDURES      Diagnosis/Disposition     I (ED Physician) discussed final disposition with the  APP    Diagnosis:  Final diagnoses:   Acute right-sided low back pain without sciatica   Infrarenal abdominal aortic aneurysm (AAA) without rupture   Cystitis       Disposition:  ED Disposition       ED Disposition   Discharge from ED Observation    Condition   --    Date/Time   Mon Jun 11, 2024  3:19 PM    Comment   --               Prescriptions:  Discharge Medication List as of 06/11/2024  3:20 PM        START taking these medications    Details   cefdinir  (OMNICEF ) 300 MG capsule Take 1 capsule (300 mg) by mouth 2 (two) times daily for 7 days, Starting Mon 06/11/2024, Until Mon 06/18/2024, E-Rx           CONTINUE these medications which have NOT CHANGED    Details   albuterol -ipratropium (DUO-NEB) 2.5-0.5(3) mg/3 mL nebulizer Take 3 mLs by nebulization 4 (four) times daily, Starting Thu 09/08/2023, Normal      amLODIPine  (NORVASC ) 2.5 MG tablet Take 1 tablet (2.5 mg) by mouth daily, Starting Wed 02/23/2023, E-Rx      budesonide  (PULMICORT ) 0.5 MG/2ML nebulizer solution Take 2 mLs (0.5 mg) by nebulization daily, Starting Thu 09/08/2023, E-Rx      Trelegy Ellipta 100-62.5-25 MCG/ACT Aerosol Pwdr, Breath Activated TAKE 1 PUFF BY MOUTH EVERY DAY, E-Rx                Delray Norris, MD  06/11/24 2313

## 2024-06-13 LAB — CULTURE, URINE: Culture Urine: >100000

## 2024-06-16 LAB — CULTURE BLOOD AEROBIC AND ANAEROBIC
Culture Blood: NO GROWTH
Culture Blood: NO GROWTH

## 2024-07-06 ENCOUNTER — Encounter (INDEPENDENT_AMBULATORY_CARE_PROVIDER_SITE_OTHER): Payer: Self-pay

## 2024-07-19 ENCOUNTER — Encounter (INDEPENDENT_AMBULATORY_CARE_PROVIDER_SITE_OTHER): Payer: Self-pay

## 2024-08-06 ENCOUNTER — Encounter (INDEPENDENT_AMBULATORY_CARE_PROVIDER_SITE_OTHER): Payer: Self-pay

## 2024-09-05 ENCOUNTER — Encounter (INDEPENDENT_AMBULATORY_CARE_PROVIDER_SITE_OTHER): Payer: Self-pay

## 2024-09-26 ENCOUNTER — Other Ambulatory Visit (INDEPENDENT_AMBULATORY_CARE_PROVIDER_SITE_OTHER): Payer: Self-pay | Admitting: Family Medicine

## 2024-10-06 ENCOUNTER — Encounter (INDEPENDENT_AMBULATORY_CARE_PROVIDER_SITE_OTHER): Payer: Self-pay

## 2024-11-05 ENCOUNTER — Encounter (INDEPENDENT_AMBULATORY_CARE_PROVIDER_SITE_OTHER): Payer: Self-pay

## 2024-12-06 ENCOUNTER — Encounter (INDEPENDENT_AMBULATORY_CARE_PROVIDER_SITE_OTHER): Payer: Self-pay
